# Patient Record
Sex: Male | Born: 1950
Health system: Southern US, Community
[De-identification: ages and names within clinical notes are randomized; demographics above are authoritative.]

## PROBLEM LIST (undated history)

## (undated) DIAGNOSIS — N182 Chronic kidney disease, stage 2 (mild): Secondary | ICD-10-CM

## (undated) DIAGNOSIS — Z9119 Patient's noncompliance with other medical treatment and regimen: Secondary | ICD-10-CM

## (undated) DIAGNOSIS — I10 Essential (primary) hypertension: Secondary | ICD-10-CM

## (undated) DIAGNOSIS — F1410 Cocaine abuse, uncomplicated: Secondary | ICD-10-CM

## (undated) DIAGNOSIS — K269 Duodenal ulcer, unspecified as acute or chronic, without hemorrhage or perforation: Secondary | ICD-10-CM

## (undated) DIAGNOSIS — Z720 Tobacco use: Secondary | ICD-10-CM

## (undated) DIAGNOSIS — K259 Gastric ulcer, unspecified as acute or chronic, without hemorrhage or perforation: Secondary | ICD-10-CM

## (undated) DIAGNOSIS — I480 Paroxysmal atrial fibrillation: Secondary | ICD-10-CM

## (undated) DIAGNOSIS — Z91199 Patient's noncompliance with other medical treatment and regimen due to unspecified reason: Secondary | ICD-10-CM

---

## 2016-03-12 DIAGNOSIS — Z759 Unspecified problem related to medical facilities and other health care: Secondary | ICD-10-CM | POA: Insufficient documentation

## 2017-07-25 DIAGNOSIS — K315 Obstruction of duodenum: Secondary | ICD-10-CM | POA: Insufficient documentation

## 2017-07-25 DIAGNOSIS — I10 Essential (primary) hypertension: Secondary | ICD-10-CM | POA: Diagnosis present

## 2018-05-31 DIAGNOSIS — F17200 Nicotine dependence, unspecified, uncomplicated: Secondary | ICD-10-CM | POA: Diagnosis present

## 2018-08-14 DIAGNOSIS — I4891 Unspecified atrial fibrillation: Secondary | ICD-10-CM | POA: Diagnosis present

## 2018-08-15 DIAGNOSIS — F1410 Cocaine abuse, uncomplicated: Secondary | ICD-10-CM | POA: Insufficient documentation

## 2018-08-15 DIAGNOSIS — K08109 Complete loss of teeth, unspecified cause, unspecified class: Secondary | ICD-10-CM | POA: Insufficient documentation

## 2018-08-16 DIAGNOSIS — K279 Peptic ulcer, site unspecified, unspecified as acute or chronic, without hemorrhage or perforation: Secondary | ICD-10-CM | POA: Insufficient documentation

## 2019-02-23 ENCOUNTER — Emergency Department: Payer: Medicare HMO

## 2019-02-23 ENCOUNTER — Inpatient Hospital Stay: Payer: Medicare HMO

## 2019-02-23 ENCOUNTER — Other Ambulatory Visit: Payer: Self-pay

## 2019-02-23 ENCOUNTER — Encounter: Payer: Self-pay | Admitting: Internal Medicine

## 2019-02-23 ENCOUNTER — Inpatient Hospital Stay
Admission: EM | Admit: 2019-02-23 | Discharge: 2019-03-06 | DRG: 327 | Discharge status: Against Medical Advice | Payer: Medicare HMO | Attending: Internal Medicine | Admitting: Internal Medicine

## 2019-02-23 DIAGNOSIS — Z79899 Other long term (current) drug therapy: Secondary | ICD-10-CM | POA: Diagnosis not present

## 2019-02-23 DIAGNOSIS — R1013 Epigastric pain: Secondary | ICD-10-CM | POA: Diagnosis present

## 2019-02-23 DIAGNOSIS — Z681 Body mass index (BMI) 19 or less, adult: Secondary | ICD-10-CM | POA: Diagnosis not present

## 2019-02-23 DIAGNOSIS — R109 Unspecified abdominal pain: Secondary | ICD-10-CM | POA: Diagnosis present

## 2019-02-23 DIAGNOSIS — E440 Moderate protein-calorie malnutrition: Secondary | ICD-10-CM | POA: Diagnosis present

## 2019-02-23 DIAGNOSIS — N189 Chronic kidney disease, unspecified: Secondary | ICD-10-CM | POA: Diagnosis not present

## 2019-02-23 DIAGNOSIS — N184 Chronic kidney disease, stage 4 (severe): Secondary | ICD-10-CM | POA: Diagnosis present

## 2019-02-23 DIAGNOSIS — Z841 Family history of disorders of kidney and ureter: Secondary | ICD-10-CM

## 2019-02-23 DIAGNOSIS — Z716 Tobacco abuse counseling: Secondary | ICD-10-CM

## 2019-02-23 DIAGNOSIS — E538 Deficiency of other specified B group vitamins: Secondary | ICD-10-CM | POA: Diagnosis present

## 2019-02-23 DIAGNOSIS — K311 Adult hypertrophic pyloric stenosis: Secondary | ICD-10-CM | POA: Diagnosis present

## 2019-02-23 DIAGNOSIS — F1410 Cocaine abuse, uncomplicated: Secondary | ICD-10-CM | POA: Diagnosis present

## 2019-02-23 DIAGNOSIS — E860 Dehydration: Secondary | ICD-10-CM | POA: Diagnosis present

## 2019-02-23 DIAGNOSIS — K5900 Constipation, unspecified: Secondary | ICD-10-CM | POA: Diagnosis present

## 2019-02-23 DIAGNOSIS — K269 Duodenal ulcer, unspecified as acute or chronic, without hemorrhage or perforation: Secondary | ICD-10-CM | POA: Diagnosis present

## 2019-02-23 DIAGNOSIS — K315 Obstruction of duodenum: Secondary | ICD-10-CM | POA: Diagnosis present

## 2019-02-23 DIAGNOSIS — F17200 Nicotine dependence, unspecified, uncomplicated: Secondary | ICD-10-CM | POA: Diagnosis present

## 2019-02-23 DIAGNOSIS — Z8249 Family history of ischemic heart disease and other diseases of the circulatory system: Secondary | ICD-10-CM | POA: Diagnosis not present

## 2019-02-23 DIAGNOSIS — J449 Chronic obstructive pulmonary disease, unspecified: Secondary | ICD-10-CM | POA: Diagnosis present

## 2019-02-23 DIAGNOSIS — N179 Acute kidney failure, unspecified: Secondary | ICD-10-CM | POA: Diagnosis present

## 2019-02-23 DIAGNOSIS — I129 Hypertensive chronic kidney disease with stage 1 through stage 4 chronic kidney disease, or unspecified chronic kidney disease: Secondary | ICD-10-CM | POA: Diagnosis present

## 2019-02-23 DIAGNOSIS — R778 Other specified abnormalities of plasma proteins: Secondary | ICD-10-CM | POA: Diagnosis present

## 2019-02-23 DIAGNOSIS — K2900 Acute gastritis without bleeding: Secondary | ICD-10-CM | POA: Diagnosis not present

## 2019-02-23 DIAGNOSIS — F17210 Nicotine dependence, cigarettes, uncomplicated: Secondary | ICD-10-CM | POA: Diagnosis present

## 2019-02-23 DIAGNOSIS — I480 Paroxysmal atrial fibrillation: Secondary | ICD-10-CM | POA: Diagnosis present

## 2019-02-23 DIAGNOSIS — R140 Abdominal distension (gaseous): Secondary | ICD-10-CM

## 2019-02-23 DIAGNOSIS — K2100 Gastro-esophageal reflux disease with esophagitis, without bleeding: Secondary | ICD-10-CM | POA: Diagnosis present

## 2019-02-23 DIAGNOSIS — R7989 Other specified abnormal findings of blood chemistry: Secondary | ICD-10-CM

## 2019-02-23 DIAGNOSIS — Z903 Acquired absence of stomach [part of]: Secondary | ICD-10-CM

## 2019-02-23 DIAGNOSIS — Z9119 Patient's noncompliance with other medical treatment and regimen: Secondary | ICD-10-CM | POA: Diagnosis not present

## 2019-02-23 DIAGNOSIS — I10 Essential (primary) hypertension: Secondary | ICD-10-CM | POA: Diagnosis not present

## 2019-02-23 DIAGNOSIS — Z20828 Contact with and (suspected) exposure to other viral communicable diseases: Secondary | ICD-10-CM | POA: Diagnosis present

## 2019-02-23 DIAGNOSIS — R112 Nausea with vomiting, unspecified: Secondary | ICD-10-CM | POA: Diagnosis not present

## 2019-02-23 DIAGNOSIS — E876 Hypokalemia: Secondary | ICD-10-CM

## 2019-02-23 DIAGNOSIS — R1110 Vomiting, unspecified: Secondary | ICD-10-CM

## 2019-02-23 DIAGNOSIS — R079 Chest pain, unspecified: Secondary | ICD-10-CM | POA: Diagnosis present

## 2019-02-23 DIAGNOSIS — Z8711 Personal history of peptic ulcer disease: Secondary | ICD-10-CM | POA: Diagnosis not present

## 2019-02-23 DIAGNOSIS — I4891 Unspecified atrial fibrillation: Secondary | ICD-10-CM | POA: Diagnosis present

## 2019-02-23 DIAGNOSIS — R9431 Abnormal electrocardiogram [ECG] [EKG]: Secondary | ICD-10-CM | POA: Diagnosis not present

## 2019-02-23 HISTORY — DX: Tobacco use: Z72.0

## 2019-02-23 HISTORY — DX: Gastric ulcer, unspecified as acute or chronic, without hemorrhage or perforation: K25.9

## 2019-02-23 HISTORY — DX: Patient's noncompliance with other medical treatment and regimen due to unspecified reason: Z91.199

## 2019-02-23 HISTORY — DX: Cocaine abuse, uncomplicated: F14.10

## 2019-02-23 HISTORY — DX: Chronic kidney disease, stage 2 (mild): N18.2

## 2019-02-23 HISTORY — DX: Essential (primary) hypertension: I10

## 2019-02-23 HISTORY — DX: Duodenal ulcer, unspecified as acute or chronic, without hemorrhage or perforation: K26.9

## 2019-02-23 HISTORY — DX: Paroxysmal atrial fibrillation: I48.0

## 2019-02-23 HISTORY — DX: Patient's noncompliance with other medical treatment and regimen: Z91.19

## 2019-02-23 LAB — CBC
HCT: 42.1 % (ref 39.0–52.0)
Hemoglobin: 14.7 g/dL (ref 13.0–17.0)
MCH: 28.4 pg (ref 26.0–34.0)
MCHC: 34.9 g/dL (ref 30.0–36.0)
MCV: 81.4 fL (ref 80.0–100.0)
Platelets: 334 10*3/uL (ref 150–400)
RBC: 5.17 MIL/uL (ref 4.22–5.81)
RDW: 14.5 % (ref 11.5–15.5)
WBC: 12.7 10*3/uL — ABNORMAL HIGH (ref 4.0–10.5)
nRBC: 0 % (ref 0.0–0.2)

## 2019-02-23 LAB — COMPREHENSIVE METABOLIC PANEL
ALT: 11 U/L (ref 0–44)
AST: 25 U/L (ref 15–41)
Albumin: 4.7 g/dL (ref 3.5–5.0)
Alkaline Phosphatase: 79 U/L (ref 38–126)
Anion gap: 19 — ABNORMAL HIGH (ref 5–15)
BUN: 28 mg/dL — ABNORMAL HIGH (ref 8–23)
CO2: 31 mmol/L (ref 22–32)
Calcium: 9.6 mg/dL (ref 8.9–10.3)
Chloride: 85 mmol/L — ABNORMAL LOW (ref 98–111)
Creatinine, Ser: 2.7 mg/dL — ABNORMAL HIGH (ref 0.61–1.24)
GFR calc Af Amer: 27 mL/min — ABNORMAL LOW (ref >60)
GFR calc non Af Amer: 23 mL/min — ABNORMAL LOW (ref >60)
Glucose, Bld: 111 mg/dL — ABNORMAL HIGH (ref 70–99)
Potassium: 3 mmol/L — ABNORMAL LOW (ref 3.5–5.1)
Sodium: 135 mmol/L (ref 135–145)
Total Bilirubin: 0.7 mg/dL (ref 0.3–1.2)
Total Protein: 8.7 g/dL — ABNORMAL HIGH (ref 6.5–8.1)

## 2019-02-23 LAB — LACTIC ACID, PLASMA
Lactic Acid, Venous: 1 mmol/L (ref 0.5–1.9)
Lactic Acid, Venous: 1.2 mmol/L (ref 0.5–1.9)

## 2019-02-23 LAB — T4, FREE: Free T4: 1.45 ng/dL — ABNORMAL HIGH (ref 0.61–1.12)

## 2019-02-23 LAB — LIPASE, BLOOD: Lipase: 44 U/L (ref 11–51)

## 2019-02-23 LAB — MAGNESIUM: Magnesium: 2.1 mg/dL (ref 1.7–2.4)

## 2019-02-23 LAB — TROPONIN I (HIGH SENSITIVITY)
Troponin I (High Sensitivity): 34 ng/L — ABNORMAL HIGH (ref <18)
Troponin I (High Sensitivity): 31 ng/L — ABNORMAL HIGH (ref <18)
Troponin I (High Sensitivity): 30 ng/L — ABNORMAL HIGH (ref <18)

## 2019-02-23 LAB — TYPE AND SCREEN
ABO/RH(D): B NEG
Antibody Screen: NEGATIVE

## 2019-02-23 LAB — SARS CORONAVIRUS 2 (TAT 6-24 HRS): SARS Coronavirus 2: NEGATIVE

## 2019-02-23 LAB — TSH: TSH: 4.885 u[IU]/mL — ABNORMAL HIGH (ref 0.350–4.500)

## 2019-02-23 MED ORDER — LIDOCAINE VISCOUS HCL 2 % MT SOLN
15.0000 mL | Freq: Once | OROMUCOSAL | Status: AC
  Administered 2019-02-23: 15 mL via ORAL
  Filled 2019-02-23: qty 15

## 2019-02-23 MED ORDER — LACTATED RINGERS IV SOLN
INTRAVENOUS | Status: DC
  Administered 2019-02-23 – 2019-03-02 (×11): via INTRAVENOUS

## 2019-02-23 MED ORDER — SODIUM CHLORIDE 0.9 % IV BOLUS
1000.0000 mL | Freq: Once | INTRAVENOUS | Status: AC
  Administered 2019-02-23: 1000 mL via INTRAVENOUS

## 2019-02-23 MED ORDER — SODIUM CHLORIDE 0.9 % IV SOLN
8.0000 mg/h | INTRAVENOUS | Status: DC
  Administered 2019-02-23 – 2019-02-24 (×2): 8 mg/h via INTRAVENOUS
  Filled 2019-02-23 (×2): qty 80

## 2019-02-23 MED ORDER — NICOTINE 14 MG/24HR TD PT24
14.0000 mg | MEDICATED_PATCH | Freq: Every day | TRANSDERMAL | Status: DC
  Administered 2019-02-24 – 2019-03-05 (×7): 14 mg via TRANSDERMAL
  Filled 2019-02-23 (×7): qty 1

## 2019-02-23 MED ORDER — ALBUTEROL SULFATE (2.5 MG/3ML) 0.083% IN NEBU
3.0000 mL | INHALATION_SOLUTION | Freq: Four times a day (QID) | RESPIRATORY_TRACT | Status: DC | PRN
  Administered 2019-03-02: 3 mL via RESPIRATORY_TRACT
  Filled 2019-02-23: qty 3

## 2019-02-23 MED ORDER — AMLODIPINE BESYLATE 5 MG PO TABS: 10.0000 mg | ORAL_TABLET | Freq: Every day | ORAL | Status: DC

## 2019-02-23 MED ORDER — SODIUM CHLORIDE 0.9 % IV SOLN
80.0000 mg | Freq: Once | INTRAVENOUS | Status: AC
  Administered 2019-02-23: 80 mg via INTRAVENOUS
  Filled 2019-02-23: qty 80

## 2019-02-23 MED ORDER — ALUM & MAG HYDROXIDE-SIMETH 200-200-20 MG/5ML PO SUSP
30.0000 mL | Freq: Once | ORAL | Status: AC
  Administered 2019-02-23: 30 mL via ORAL
  Filled 2019-02-23: qty 30

## 2019-02-23 NOTE — ED Provider Notes (Signed)
[facility] Emergency Department Provider Note  Time seen: 2:20 PM  I have reviewed the triage vital signs and the nursing notes.   HISTORY  Chief Complaint Chest Pain   HPI Keith [name] is a 68 y.o. male with a past medical history of hypertension, A. fib, substance abuse, chronic kidney disease, presents to the emergency department for chest pain/epigastric pain.  Patient states over the past 5 days he has been experiencing epigastric pain and lower chest pain, very frequent episodes of nausea and vomiting per patient.  Denies any significant diarrhea.  No dysuria.  Mild shortness of breath but denies any cough or fever.  Describes abdominal pain as severe and burning.  No past medical history on file.  There are no active problems to display for this patient.   Prior to Admission medications   Not on File    Not on File  No family history on file.  Social History Social History   Tobacco Use  . Smoking status: Not on file  Substance Use Topics  . Alcohol use: Not on file  . Drug use: Not on file    Review of Systems Constitutional: Negative for fever Cardiovascular: Moderate lower chest pain Respiratory: Negative for shortness of breath. Gastrointestinal: Moderate epigastric pain.  Intermittent nausea and vomiting. Musculoskeletal: Negative for musculoskeletal complaints Skin: Negative for skin complaints  Neurological: Negative for headache All other ROS negative  ____________________________________________   PHYSICAL EXAM:  VITAL SIGNS: ED Triage Vitals  Enc Vitals Group     BP 02/23/19 1350 100/76     Pulse Rate 02/23/19 1350 (!) 40     Resp 02/23/19 1350 18     Temp 02/23/19 1350 98.2 F (36.8 C)     Temp Source 02/23/19 1350 Oral     SpO2 02/23/19 1350 99 %     Weight 02/23/19 1351 120 lb (54.4 kg)     Height 02/23/19 1351 5\' 7"  (1.702 m)     Head Circumference --      Peak Flow --      Pain Score 02/23/19 1351 10      Pain Loc --      Pain Edu? --      Excl. in [place]? --     Constitutional: Alert and oriented. Well appearing and in no distress. Eyes: Normal exam ENT      Head: Normocephalic and atraumatic.      Mouth/Throat: Mucous membranes are moist. Cardiovascular: Normal rate, regular rhythm.  Respiratory: Normal respiratory effort without tachypnea nor retractions. Breath sounds are clear Gastrointestinal: Moderate epigastric tenderness palpation without rebound guarding or distention. Musculoskeletal: Nontender with normal range of motion in all extremities.  No edema. Neurologic:  Normal speech and language. No gross focal neurologic deficits Skin: Very dry appearing skin. Psychiatric: Mood and affect are normal.   ____________________________________________    EKG  EKG viewed and interpreted by myself shows a normal sinus rhythm 85 bpm with a narrow QRS, normal axis, largely normal intervals.  Patient does have inferolateral T wave inversions.  No old EKG for comparison.  ____________________________________________    RADIOLOGY  Chest x-ray shows COPD but no acute abnormality. Abdominal x-ray shows nonobstructive gas pattern  ____________________________________________   INITIAL IMPRESSION / ASSESSMENT AND PLAN / ED COURSE  Pertinent labs & imaging results that were available during my care of the patient were reviewed by me and considered in my medical decision making (see chart for details).   Patient presents  to the emergency department for chest pain/epigastric pain ongoing for the past 4 or 5 days per patient.  Differential would include ACS, gastritis, pancreatitis, biliary disease, gastroenteritis.  Patient states a dull epigastric/lower chest pain that is somewhat relieved when he eats, however states when he drinks he will often times belch up liquid.  States a mild soreness in his chest due to the vomiting/belching.  States slight shortness of breath.  States of  symptoms have been ongoing for 5 days.  We will check labs including cardiac enzymes.  Patient does have inferolateral T wave inversions with no old EKG for comparison.  Patient's labs have resulted showing slight renal insufficiency with a creatinine of 2.7 and an anion gap of 19.  We will IV hydrate the patient.  I reviewed the patient's care everywhere notes his last creatinine documented was 2.5 in August 2019, however prior to that it appears that his renal function was considerably better and that admission was due to nausea and vomiting as well.  Patient also has an elevated troponin of 34 however when I reviewed his labs in care everywhere he has a history of mildly elevated troponins.  Last ER note from [facility] also notes T wave inversions although does not specifically state which lead.  Notes also suggest the patient has gastric outlet obstruction diagnosed in 2019 and possibly again earlier this year due to gastric ulcers and gastritis, in reviewing the patient's notes it appears that he did not follow-up with GI.  Per [facility] patient also had polysubstance abuse with opiates marijuana and cocaine on UDS.  We will repeat a UDS in the emergency department.  Given the patient's significant dehydration elevated anion gap and continued nausea we will place patient on Protonix infusion and admit to the hospital service for further treatment for presumed gastritis/gastric ulcers.  Keith [name] was evaluated in Emergency Department on 02/23/2019 for the symptoms described in the history of present illness. He was evaluated in the context of the global COVID-19 pandemic, which necessitated consideration that the patient might be at risk for infection with the SARS-CoV-2 virus that causes COVID-19. Institutional protocols and algorithms that pertain to the evaluation of patients at risk for COVID-19 are in a state of rapid change based on information released by regulatory bodies including the CDC and federal  and state organizations. These policies and algorithms were followed during the patient's care in the ED.  ____________________________________________   FINAL CLINICAL IMPRESSION(S) / ED DIAGNOSES  Chest pain Epigastric pain Gastritis   Harvest Dark, MD 02/23/19 1531

## 2019-02-23 NOTE — H&P (Signed)
History and Physical    Keith [name] [id number] DOB: [birth year] DOA: 02/23/2019  PCP: Patient, No Pcp Per (Confirm with patient/family/NH records and if not entered, this has to be entered at [facility] point of entry) Patient coming from: Home  I have personally briefly reviewed patient's old medical records in [place]  Chief Complaint: Abdominal pain/ Chest pain.   HPI: Keith [name] is a 68 y.o. male with medical history significant of PUD . Htn. A.fib not on anticoagulation secondary to his gastric and duodenal ulcers, presents with Chest pain and epigastric pain. Pt reports that he has had two episode of similar episode in past. He has tried to make appt with [facility] and cannot get thru and he has also tried to call [facility] for primary care and cant get thru. Pt is noncompliant and has not taken his bp meds.  Patient also reports that he does not use drugs in fact he went to a gathering few days ago" drug party" where he did not use any substances.  He is cutting down his smoking and 1 pack some 3 days.  Per patient he has not had outpatient follow-up as he could not connect with the specialists at [facility]. Chart review shows patient underwent EGD on 08/15/2018 which showed LA grade D esophagitis, non-bleeding gastric ulcer, congested friable mucosa in the pylorus s/p epinephrine and large duodenal ulcer with duodenal stenosis.plan was for pt ot have repeat egd and he did not follow up. Patient has a past medical history of A. fib, substance abuse, CKD.  ED Course:  Patient in the emergency room is alert awake oriented initial vitals shows blood pressure 100/76, heart rate of 40, respirations of 18, temperature of 98.2.  Chest x-ray shows COPD, abdominal x-ray shows nonobstructive gas pattern.  Covid testing is pending.   Review of Systems: As per HPI otherwise 10 point review of systems negative.   Past Medical History:  Diagnosis Date  . Hypertension   . Paroxysmal atrial fibrillation  (HCC)   . Tobacco abuse     History reviewed. No pertinent surgical history.   reports that he has been smoking cigarettes. He does not have any smokeless tobacco history on file. He reports previous alcohol use. No history on file for drug.  No Known Allergies  History reviewed. No pertinent family history.   Prior to Admission medications   Not on File    Physical Exam: Vitals:   02/23/19 1350 02/23/19 1351 02/23/19 1500 02/23/19 1814  BP: 100/76  110/88 110/80  Pulse: (!) 40  85 68  Resp: 18   15  Temp: 98.2 F (36.8 C)     TempSrc: Oral     SpO2: 99%  94% 98%  Weight:  54.4 kg    Height:  5\' 7"  (1.702 m)      Constitutional: NAD, calm, comfortable Vitals:   02/23/19 1350 02/23/19 1351 02/23/19 1500 02/23/19 1814  BP: 100/76  110/88 110/80  Pulse: (!) 40  85 68  Resp: 18   15  Temp: 98.2 F (36.8 C)     TempSrc: Oral     SpO2: 99%  94% 98%  Weight:  54.4 kg    Height:  5\' 7"  (1.702 m)     Eyes: PERRL, lids and conjunctivae normal ENMT: Mucous membranes are moist. Posterior pharynx clear of any exudate or lesions.Normal dentition.  Neck: normal, supple, no masses, no thyromegaly Respiratory: clear to auscultation bilaterally, no wheezing, no crackles. Normal  respiratory effort. No accessory muscle use.  Cardiovascular: Regular rate and rhythm, no murmurs / rubs / gallops. No extremity edema. 2+ pedal pulses. No carotid bruits.  Abdomen: no tenderness, no masses palpated. No hepatosplenomegaly. Bowel sounds positive.  Musculoskeletal: no clubbing / cyanosis. No joint deformity upper and lower extremities. Good ROM, no contractures. Normal muscle tone.  Skin: no rashes, lesions, ulcers. No induration Neurologic: CN 2-12 grossly intact. Sensation intact, DTR normal. Strength 5/5 in all 4.  Psychiatric: Normal judgment and insight. Alert and oriented x 3. Normal mood.   (Anything < 9 systems with 2 bullets each down codes to level 1) (If patient refuses exam  can't bill higher level) (Make sure to document decubitus ulcers present on admission -- if possible -- and whether patient has chronic indwelling catheter at time of admission)  Labs on Admission: I have personally reviewed following labs and imaging studies  CBC: Recent Labs  Lab 02/23/19 1414  WBC 12.7*  HGB 14.7  HCT 42.1  MCV 81.4  PLT [id number]   Basic Metabolic Panel: Recent Labs  Lab 02/23/19 1414  NA 135  K 3.0*  CL 85*  CO2 31  GLUCOSE 111*  BUN 28*  CREATININE 2.70*  CALCIUM 9.6   GFR: Estimated Creatinine Clearance: 20.1 mL/min (A) (by C-G formula based on SCr of 2.7 mg/dL (H)). Liver Function Tests: Recent Labs  Lab 02/23/19 1414  AST 25  ALT 11  ALKPHOS 79  BILITOT 0.7  PROT 8.7*  ALBUMIN 4.7   Recent Labs  Lab 02/23/19 1414  LIPASE 44   No results for input(s): AMMONIA in the last 168 hours. Coagulation Profile: No results for input(s): INR, PROTIME in the last 168 hours. Cardiac Enzymes: No results for input(s): CKTOTAL, CKMB, CKMBINDEX, TROPONINI in the last 168 hours. BNP (last 3 results) No results for input(s): PROBNP in the last 8760 hours. HbA1C: No results for input(s): HGBA1C in the last 72 hours. CBG: No results for input(s): GLUCAP in the last 168 hours. Lipid Profile: No results for input(s): CHOL, HDL, LDLCALC, TRIG, CHOLHDL, LDLDIRECT in the last 72 hours. Thyroid Function Tests: No results for input(s): TSH, T4TOTAL, FREET4, T3FREE, THYROIDAB in the last 72 hours. Anemia Panel: No results for input(s): VITAMINB12, FOLATE, FERRITIN, TIBC, IRON, RETICCTPCT in the last 72 hours. Urine analysis: No results found for: COLORURINE, APPEARANCEUR, LABSPEC, [place], GLUCOSEU, HGBUR, BILIRUBINUR, KETONESUR, PROTEINUR, UROBILINOGEN, NITRITE, LEUKOCYTESUR  Radiological Exams on Admission: Dg Chest Portable 1 View  Result Date: 02/23/2019 CLINICAL DATA:  Chest pain and near syncopal episode following a bowel movement. Nausea and vomiting  for the past 2 days. Smoker. EXAM: PORTABLE CHEST 1 VIEW COMPARISON:  None. FINDINGS: Normal sized heart. Tortuous aorta. Moderate central peribronchial thickening. Hyperexpanded, clear lungs. Unremarkable bones. IMPRESSION: Moderate changes of COPD and chronic bronchitis. No acute abnormality. Electronically Signed   By: Claudie Revering M.D.   On: 02/23/2019 14:43   Dg Abd Portable 2 Views  Result Date: 02/23/2019 CLINICAL DATA:  Abdomen pain, chest pain nausea and vomiting EXAM: PORTABLE ABDOMEN - 2 VIEW COMPARISON:  None. FINDINGS: Visualized lung bases are clear. Questionable lucency at the epigastric region. Nonobstructed bowel-gas pattern. High-riding colon at the right upper quadrant. IMPRESSION: 1. Nonobstructed gas pattern. 2. Questionable lucency at the epigastric region, consider decubitus view or CT to exclude intraperitoneal air. Electronically Signed   By: Donavan Foil M.D.   On: 02/23/2019 15:20    EKG: Independently reviewed.  Normal sinus rhythm 85 LVH.  Assessment/Plan Principal Problem:   Left-sided chest pain Active Problems:   Atrial fibrillation (HCC)   Abdominal pain   Essential hypertension   Hypokalemia   Troponin level elevated   Tobacco use disorder   Moderate protein-energy malnutrition (HCC)   Acute kidney injury superimposed on CKD (HCC)  Left-sided chest wall pain: Suspect this to be noncardiac chest pain radiating to his left chest from his abdomen related to his peptic ulcer disease. We will however follow cardiac enzymes, mid patient to cardiac telemetry, monitor electrolytes, consider CT chest .  Obtain 2D echocardiogram evaluation wall motion abnormality ejection fraction, pericardial effusion.  Abdominal pain: Attribute to PUD, we will check lactic and guaiac , GI Consult for UGI eval and if negative we will consider vascular consult for ischemic eval. USG of abdomen as well.  PAF : Anticoagulation held due to PUD. Rate control and home meds  restarted. thyroid studies.   Essential hypertension: Patient has not been on his blood pressure regimen for nearly 2 weeks per history. Patient's amlodipine follow closely blood pressure and monitor for hypotension.  Hypokalemia: We will replete patient's potassium with 20 mEq of potassium.  Abnormal troponin levels: Attributed to patient's acute kidney injury. We will perform a 2D echocardiogram.  Tobacco abuse disorder discussed with patient about tobacco cessation briefly: We will start patient on nicotine patch.   Moderate protein energy malnutrition:  Patient's last weight is 54.4 kg.  I suspect patient's weight loss is secondary to decreased p.o. intake Secondary to stomach issues.  Consider evaluation of patient for mesenteric angina.  AKI on chronic kidney disease: Although we do not have a clear cut baseline for this patient's acute kidney injury. Goal will be to continue cautious hydration and monitoring daily, We will avoid nephrotoxic agents and medication and contrast and renally dose , meds needed.    Basically noncompliant 68 year old African-American male with past medical history of paroxysmal A. fib not on anticoagulation secondary to to peptic ulcers, substance abuse, pancreatitis seen in the emergency room today with complaints of abdominal pain and chest pain, patient does report some belching along with the chest discomfort that he has been getting.  Lab results as we discussed show mildly worsening creatinine with his baseline of 2.5 in August no other records in chart for him.  Patient does have an elevated troponin which we suspect is noncardiac related was likely related to his chronic kidney disease.  Labs also show an elevated anion gap which I suspect is because of his acute kidney injury on chronic kidney disease stage IV.   DVT prophylaxis: SCDs Code Status: Full code  Family Communication: None at bedside  Disposition Plan: Discharge in 3 to 4 days  consider assisted living facility  Consults called: GI  Admission status: Inaptient  Para Skeans MD [facility]  If 7PM-7AM, please contact night-coverage [email or web address] Password [facility]  02/23/2019, 6:17 PM

## 2019-02-23 NOTE — ED Triage Notes (Signed)
Pt arrived via EMS from a friends house d/t CP and near syncope after a BM. Pt reports having sternal CP since Monday and being unable to keep any food down since yesterday. Pt has had n/v x 2 days, Pt has hx of A. Fib and MI.

## 2019-02-24 ENCOUNTER — Inpatient Hospital Stay
Admit: 2019-02-24 | Discharge: 2019-02-24 | Disposition: A | Discharge status: Home / Self Care | Payer: Medicare HMO | Attending: Internal Medicine | Admitting: Internal Medicine

## 2019-02-24 ENCOUNTER — Other Ambulatory Visit: Payer: Self-pay

## 2019-02-24 ENCOUNTER — Encounter: Payer: Self-pay | Admitting: Physician Assistant

## 2019-02-24 ENCOUNTER — Inpatient Hospital Stay: Payer: Medicare HMO

## 2019-02-24 DIAGNOSIS — E876 Hypokalemia: Secondary | ICD-10-CM

## 2019-02-24 DIAGNOSIS — Z8711 Personal history of peptic ulcer disease: Secondary | ICD-10-CM

## 2019-02-24 DIAGNOSIS — I480 Paroxysmal atrial fibrillation: Secondary | ICD-10-CM

## 2019-02-24 DIAGNOSIS — I10 Essential (primary) hypertension: Secondary | ICD-10-CM

## 2019-02-24 DIAGNOSIS — F1410 Cocaine abuse, uncomplicated: Secondary | ICD-10-CM

## 2019-02-24 DIAGNOSIS — R9431 Abnormal electrocardiogram [ECG] [EKG]: Secondary | ICD-10-CM

## 2019-02-24 DIAGNOSIS — F17200 Nicotine dependence, unspecified, uncomplicated: Secondary | ICD-10-CM

## 2019-02-24 DIAGNOSIS — E440 Moderate protein-calorie malnutrition: Secondary | ICD-10-CM

## 2019-02-24 DIAGNOSIS — N179 Acute kidney failure, unspecified: Secondary | ICD-10-CM

## 2019-02-24 DIAGNOSIS — K2900 Acute gastritis without bleeding: Secondary | ICD-10-CM

## 2019-02-24 DIAGNOSIS — E860 Dehydration: Secondary | ICD-10-CM

## 2019-02-24 DIAGNOSIS — R079 Chest pain, unspecified: Secondary | ICD-10-CM

## 2019-02-24 DIAGNOSIS — R1013 Epigastric pain: Secondary | ICD-10-CM

## 2019-02-24 DIAGNOSIS — N189 Chronic kidney disease, unspecified: Secondary | ICD-10-CM

## 2019-02-24 LAB — URINE DRUG SCREEN, QUALITATIVE (ARMC ONLY)
Amphetamines, Ur Screen: NOT DETECTED
Barbiturates, Ur Screen: NOT DETECTED
Benzodiazepine, Ur Scrn: NOT DETECTED
Cannabinoid 50 Ng, Ur ~~LOC~~: NOT DETECTED
Cocaine Metabolite,Ur ~~LOC~~: POSITIVE — AB
MDMA (Ecstasy)Ur Screen: NOT DETECTED
Methadone Scn, Ur: NOT DETECTED
Opiate, Ur Screen: NOT DETECTED
Phencyclidine (PCP) Ur S: NOT DETECTED
Tricyclic, Ur Screen: NOT DETECTED

## 2019-02-24 LAB — HIV ANTIBODY (ROUTINE TESTING W REFLEX): HIV Screen 4th Generation wRfx: NONREACTIVE

## 2019-02-24 LAB — COMPREHENSIVE METABOLIC PANEL
ALT: 8 U/L (ref 0–44)
AST: 16 U/L (ref 15–41)
Albumin: 3.2 g/dL — ABNORMAL LOW (ref 3.5–5.0)
Alkaline Phosphatase: 54 U/L (ref 38–126)
Anion gap: 8 (ref 5–15)
BUN: 26 mg/dL — ABNORMAL HIGH (ref 8–23)
CO2: 25 mmol/L (ref 22–32)
Calcium: 7.6 mg/dL — ABNORMAL LOW (ref 8.9–10.3)
Chloride: 99 mmol/L (ref 98–111)
Creatinine, Ser: 1.6 mg/dL — ABNORMAL HIGH (ref 0.61–1.24)
GFR calc Af Amer: 51 mL/min — ABNORMAL LOW (ref >60)
GFR calc non Af Amer: 44 mL/min — ABNORMAL LOW (ref >60)
Glucose, Bld: 89 mg/dL (ref 70–99)
Potassium: 2.6 mmol/L — CL (ref 3.5–5.1)
Sodium: 132 mmol/L — ABNORMAL LOW (ref 135–145)
Total Bilirubin: 0.8 mg/dL (ref 0.3–1.2)
Total Protein: 6.2 g/dL — ABNORMAL LOW (ref 6.5–8.1)

## 2019-02-24 LAB — CBC WITH DIFFERENTIAL/PLATELET
Abs Immature Granulocytes: 0.05 10*3/uL (ref 0.00–0.07)
Basophils Absolute: 0 10*3/uL (ref 0.0–0.1)
Basophils Relative: 1 %
Eosinophils Absolute: 0.1 10*3/uL (ref 0.0–0.5)
Eosinophils Relative: 2 %
HCT: 34.5 % — ABNORMAL LOW (ref 39.0–52.0)
Hemoglobin: 11.8 g/dL — ABNORMAL LOW (ref 13.0–17.0)
Immature Granulocytes: 1 %
Lymphocytes Relative: 34 %
Lymphs Abs: 2.8 10*3/uL (ref 0.7–4.0)
MCH: 28.9 pg (ref 26.0–34.0)
MCHC: 34.2 g/dL (ref 30.0–36.0)
MCV: 84.4 fL (ref 80.0–100.0)
Monocytes Absolute: 0.8 10*3/uL (ref 0.1–1.0)
Monocytes Relative: 9 %
Neutro Abs: 4.5 10*3/uL (ref 1.7–7.7)
Neutrophils Relative %: 53 %
Platelets: 250 10*3/uL (ref 150–400)
RBC: 4.09 MIL/uL — ABNORMAL LOW (ref 4.22–5.81)
RDW: 14.5 % (ref 11.5–15.5)
WBC: 8.3 10*3/uL (ref 4.0–10.5)
nRBC: 0 % (ref 0.0–0.2)

## 2019-02-24 LAB — URINALYSIS, COMPLETE (UACMP) WITH MICROSCOPIC
Bacteria, UA: NONE SEEN
Bilirubin Urine: NEGATIVE
Glucose, UA: NEGATIVE mg/dL
Hgb urine dipstick: NEGATIVE
Ketones, ur: NEGATIVE mg/dL
Leukocytes,Ua: NEGATIVE
Nitrite: NEGATIVE
Protein, ur: NEGATIVE mg/dL
Specific Gravity, Urine: 1.008 (ref 1.005–1.030)
WBC, UA: NONE SEEN WBC/hpf (ref 0–5)
pH: 6 (ref 5.0–8.0)

## 2019-02-24 LAB — CREATININE, URINE, RANDOM: Creatinine, Urine: 90 mg/dL

## 2019-02-24 LAB — SODIUM, URINE, RANDOM: Sodium, Ur: 21 mmol/L

## 2019-02-24 LAB — ECHOCARDIOGRAM LIMITED
Height: 67 in
Weight: 2072 oz

## 2019-02-24 LAB — CK: Total CK: 138 U/L (ref 49–397)

## 2019-02-24 LAB — LIPASE, BLOOD: Lipase: 46 U/L (ref 11–51)

## 2019-02-24 MED ORDER — HYDROCERIN EX CREA
TOPICAL_CREAM | Freq: Two times a day (BID) | CUTANEOUS | Status: DC
  Administered 2019-02-24 – 2019-03-06 (×11): via TOPICAL
  Filled 2019-02-24: qty 113

## 2019-02-24 MED ORDER — ADULT MULTIVITAMIN W/MINERALS CH
1.0000 | ORAL_TABLET | Freq: Every day | ORAL | Status: DC
  Administered 2019-02-25 – 2019-02-26 (×2): 1 via ORAL
  Filled 2019-02-24 (×2): qty 1

## 2019-02-24 MED ORDER — IOHEXOL 9 MG/ML PO SOLN
500.0000 mL | ORAL | Status: AC
  Administered 2019-02-24 (×2): 500 mL via ORAL

## 2019-02-24 MED ORDER — PANTOPRAZOLE SODIUM 40 MG IV SOLR
40.0000 mg | Freq: Two times a day (BID) | INTRAVENOUS | Status: DC
  Administered 2019-02-24 – 2019-03-06 (×20): 40 mg via INTRAVENOUS
  Filled 2019-02-24 (×20): qty 40

## 2019-02-24 MED ORDER — POTASSIUM CHLORIDE CRYS ER 20 MEQ PO TBCR
40.0000 meq | EXTENDED_RELEASE_TABLET | ORAL | Status: AC
  Administered 2019-02-24 (×3): 40 meq via ORAL
  Filled 2019-02-24 (×3): qty 2

## 2019-02-24 MED ORDER — BOOST / RESOURCE BREEZE PO LIQD CUSTOM
1.0000 | Freq: Three times a day (TID) | ORAL | Status: DC
  Administered 2019-02-24 – 2019-02-26 (×5): 1 via ORAL

## 2019-02-24 MED ORDER — SODIUM CHLORIDE 0.9 % IV SOLN: INTRAVENOUS | Status: DC

## 2019-02-24 NOTE — Progress Notes (Addendum)
Initial Nutrition Assessment  DOCUMENTATION CODES:   Non-severe (moderate) malnutrition in context of social or environmental circumstances  INTERVENTION:  Provide Boost Breeze po TID, each supplement provides 250 kcal and 9 grams of protein.  Once diet advances past clear liquids provide Ensure Enlive po TID, each supplement provides 350 kcal and 20 grams of protein.  Provide daily MVI.  NUTRITION DIAGNOSIS:   Moderate Malnutrition related to social / environmental circumstances(polysubstance abuse) as evidenced by moderate fat depletion, moderate muscle depletion, severe muscle depletion.  GOAL:   Patient will meet greater than or equal to 90% of their needs  MONITOR:   PO intake, Supplement acceptance, Diet advancement, Labs, Weight trends, I & O's  REASON FOR ASSESSMENT:   Malnutrition Screening Tool, Consult Assessment of nutrition requirement/status  ASSESSMENT:   68 year old male with PMHx of paroxysmal A-fib, HTN, polysubstance abuse (cocaine positive), hx of gastric and duodenal ulcers admitted with epigastric pain likely from underlying ulcers.   Met with patient at bedside. He reports he has had a decreased appetite and intake for a while now. He has been eating less often and smaller amounts at meals. Patient is unable to provide any details on intake PTA. He reports that right before coming in he was having trouble tolerating liquids but was still able to tolerate solids. He is now on CLD and is tolerating well. He had almost all of his liquid breakfast tray this morning. He is amenable to drinking oral nutrition supplements to help meet calorie/protein needs.  No weight history to trend in chart. Patient reports his UBW was 150 lbs but he is unsure the last time he weighed this or how quickly the weight was lost.  Medications reviewed and include: nicotine patch, potassium chloride 40 mEq Q4hrs PO x 3 today, LR @ 100 mL/hr, pantoprazole.  Labs reviewed: Sodium  132, Potassium 2.6, BUN 26, Creatinine 1.6.  NUTRITION - FOCUSED PHYSICAL EXAM:    Most Recent Value  Orbital Region  Moderate depletion  Upper Arm Region  Severe depletion  Thoracic and Lumbar Region  Moderate depletion  Buccal Region  Moderate depletion  Temple Region  Severe depletion  Clavicle Bone Region  Severe depletion  Clavicle and Acromion Bone Region  Severe depletion  Scapular Bone Region  Moderate depletion  Dorsal Hand  Moderate depletion  Patellar Region  Moderate depletion  Anterior Thigh Region  Moderate depletion  Posterior Calf Region  Severe depletion  Edema (RD Assessment)  None  Hair  Reviewed  Eyes  Reviewed  Mouth  Reviewed  Skin  Reviewed [dry and flaky]  Nails  Reviewed     Diet Order:   Diet Order            Diet clear liquid Room service appropriate? Yes; Fluid consistency: Thin  Diet effective now             EDUCATION NEEDS:   No education needs have been identified at this time  Skin:  Skin Assessment: Reviewed RN Assessment  Last BM:  02/16/2019 per chart  Height:   Ht Readings from Last 1 Encounters:  02/23/19 '5\' 7"'$  (1.702 m)   Weight:   Wt Readings from Last 1 Encounters:  02/24/19 58.7 kg   Ideal Body Weight:  67.3 kg  BMI:  Body mass index is 20.28 kg/m.  Estimated Nutritional Needs:   Kcal:  1600-1800  Protein:  80-90 grams  Fluid:  1.6-1.8 L/day  Jacklynn Barnacle, MS, RD, LDN Office: [phone] Pager:  [phone] After Hours/Weekend Pager: [phone]

## 2019-02-24 NOTE — Progress Notes (Signed)
*  PRELIMINARY RESULTS* Echocardiogram 2D Echocardiogram has been performed. A Complete Echo was performed, this was ordered incorrectly.  Lavell Luster Corrigan Kretschmer 02/24/2019, 12:04 PM

## 2019-02-24 NOTE — Consult Note (Addendum)
Cardiology Consultation:   Patient ID: Keith [name]; [id number]; 1950-06-28   Admit date: 02/23/2019 Date of Consult: 02/24/2019  Primary Care Provider: Patient, No Pcp Per Primary Cardiologist: new to [facility] - consult by [place]   Patient Profile:   Keith [name] is a 68 y.o. male with a hx of PAF not on anticoagulation secondary to gastric outlet obstruction secondary to deep ulcerations, CKD stage II, polysubstance abuse with cocaine use, COPD with ongoing tobacco abuse, syncope felt to be vasovagal versus orthostatic, HTN, and noncompliance who is being seen today for the evaluation of preprocedure cardiac evaluation at the request of Dr. Grandville Silos.  History of Present Illness:   Keith [name] was previously admitted in 07/2017 at [facility] with gastric outlet obstruction secondary to multiple gastric ulcers including pyloric channel ulcer with associated stenosis.  More recently, he was admitted to [facility] in 08/2018 with nausea and vomiting with the inability to tolerate p.o. intake as well as syncope.  His syncope was felt to be vasovagal versus orthostatic in etiology in the setting of dehydration from days of vomiting.  He was noted to have positive orthostatic vital signs.  Labs also showed acute on chronic kidney disease with a serum creatinine of 2.34 at admission with a baseline of 1.2-1.3.  He was also noted to be in A. fib on EKG and telemetry with recommendation from GI to avoid oral anticoagulation with ongoing gastric and duodenal ulcers.  Troponin was minimally elevated at 0.233 with new T wave inversions on EKG with findings felt to most likely represent demand ischemia in the setting of volume depletion, vomiting, and cocaine use.  He underwent EGD as outlined in the discharge summary from [facility].  Repeat EGD was recommended though patient was lost to follow-up.  He presented to [facility] on 02/23/2019 with worsening of ongoing epigastric pain for 1 to 2 months.  Over the past 7 days he has  not been able to keep any solids or liquids down noting multiple daily episodes of emesis. In this setting, he started having worsening epigastric pain. No chest pain, SOB, palpitations, presyncope or syncope. No lower extremity swelling, abdominal distension, orthopnea, or PND. He indicates he had try to get follow-up appointments with [facility] though states he could not get through.  Upon the patient's arrival to [facility] they were found to have stable vital signs.  EKG showed sinus rhythm with frequent PACs, LVH with early repolarization abnormality, nonspecific inferolateral T wave inversion CXR showed chronic bronchitis with no acute abnormality. Labs showed urine drug screen positive for cocaine, high-sensitivity troponin of 34 with a delta of 31, COVID-19 negative, potassium 3.0 trending to 2.6, serum creatinine 2.7 trending to 1.6 with a baseline of 1.2-1.3, TSH 4.885, free T4 1.45, magnesium 2.1.  GI has consulted and are planning for EGD with timing of procedure to be determined on cardiac/anesthesia evaluation.  Currently, laying in bed comfortably without chest pain or SOB.   Past Medical History:  Diagnosis Date  . CKD (chronic kidney disease), stage II   . Cocaine abuse ([place])   . Duodenal ulcer   . Gastric ulcer   . Hypertension   . Noncompliance   . Paroxysmal atrial fibrillation (HCC)   . Tobacco abuse     History reviewed. No pertinent surgical history.   Home Meds: Prior to Admission medications   Medication Sig Start Date End Date Taking? Authorizing Provider  albuterol (VENTOLIN HFA) 108 (90 Base) MCG/ACT inhaler Inhale 2 puffs into  the lungs every 6 (six) hours as needed. 08/16/18 08/16/19 Yes [provider]  amLODipine (NORVASC) 10 MG tablet Take 10 mg by mouth daily. 08/16/18 08/16/19 Yes [provider]    Inpatient Medications: Scheduled Meds: . feeding supplement  1 Container Oral TID BM  . [START ON 02/25/2019] multivitamin with minerals  1 tablet Oral Daily   . nicotine  14 mg Transdermal Daily  . potassium chloride  40 mEq Oral Q4H   Continuous Infusions: . lactated ringers 100 mL/hr at 02/24/19 0634  . pantoprozole (PROTONIX) infusion 8 mg/hr (02/24/19 0311)   PRN Meds: albuterol  Allergies:  No Known Allergies  Social History:   Social History   Socioeconomic History  . Marital status: Single    Spouse name: Not on file  . Number of children: Not on file  . Years of education: Not on file  . Highest education level: Not on file  Occupational History  . Not on file  Social Needs  . Financial resource strain: Not on file  . Food insecurity    Worry: Not on file    Inability: Not on file  . Transportation needs    Medical: Not on file    Non-medical: Not on file  Tobacco Use  . Smoking status: Current Every Day Smoker    Types: Cigarettes  Substance and Sexual Activity  . Alcohol use: Not Currently  . Drug use: Not on file  . Sexual activity: Not on file  Lifestyle  . Physical activity    Days per week: Not on file    Minutes per session: Not on file  . Stress: Not on file  Relationships  . Social Herbalist on phone: Not on file    Gets together: Not on file    Attends religious service: Not on file    Active member of club or organization: Not on file    Attends meetings of clubs or organizations: Not on file    Relationship status: Not on file  . Intimate partner violence    Fear of current or ex partner: Not on file    Emotionally abused: Not on file    Physically abused: Not on file    Forced sexual activity: Not on file  Other Topics Concern  . Not on file  Social History Narrative  . Not on file     Family History:   Family History  Problem Relation Age of Onset  . Diabetes Mother   . Hypertension Mother   . CAD Father   . Diabetes Sister   . Hypertension Sister   . Kidney failure Sister     ROS:  Review of Systems  Constitutional: Positive for malaise/fatigue. Negative for  chills, diaphoresis, fever and weight loss.  HENT: Negative for congestion.   Eyes: Negative for discharge and redness.  Respiratory: Negative for cough, hemoptysis, sputum production, shortness of breath and wheezing.   Cardiovascular: Negative for chest pain, palpitations, orthopnea, claudication, leg swelling and PND.  Gastrointestinal: Positive for abdominal pain, heartburn, nausea and vomiting. Negative for blood in stool and melena.  Genitourinary: Negative for hematuria.  Musculoskeletal: Negative for falls and myalgias.  Skin: Negative for rash.  Neurological: Positive for weakness. Negative for dizziness, tingling, tremors, sensory change, speech change, focal weakness and loss of consciousness.  Endo/Heme/Allergies: Does not bruise/bleed easily.  Psychiatric/Behavioral: Positive for substance abuse. The patient is not nervous/anxious.   All other systems reviewed and are negative.  Physical Exam/Data:   Vitals:   02/23/19 1953 02/23/19 2345 02/24/19 0513 02/24/19 0833  BP: (!) 153/91 (!) 148/96 (!) 132/91 (!) 137/93  Pulse: (!) 58 (!) 57 (!) 55 (!) 53  Resp: 14 20 19    Temp: 98 F (36.7 C) 97.7 F (36.5 C) 97.7 F (36.5 C) (!) 97.4 F (36.3 C)  TempSrc: Oral  Oral Oral  SpO2: 99% 100% 98% 100%  Weight:  58.5 kg 58.7 kg   Height:  5\' 7"  (1.702 m)      Intake/Output Summary (Last 24 hours) at 02/24/2019 1341 Last data filed at 02/24/2019 1018 Gross per 24 hour  Intake 1692.07 ml  Output 900 ml  Net 792.07 ml   Filed Weights   02/23/19 1351 02/23/19 2345 02/24/19 0513  Weight: 54.4 kg 58.5 kg 58.7 kg   Body mass index is 20.28 kg/m.   Physical Exam: General: Frail appearing, missing multiple teeth, frequently brushing his nose with his hand, in no acute distress. Head: Normocephalic, atraumatic, sclera non-icteric, no xanthomas, nares without discharge.  Neck: Negative for carotid bruits. JVD not elevated. Lungs: Clear bilaterally to auscultation without  wheezes, rales, or rhonchi. Breathing is unlabored. Heart: Irregular with S1 S2. No murmurs, rubs, or gallops appreciated. Abdomen: Soft, non-tender, non-distended with normoactive bowel sounds. No hepatomegaly. No rebound/guarding. No obvious abdominal masses. Msk:  Strength and tone appear normal for age. Extremities: No clubbing or cyanosis. No edema. Distal pedal pulses are 2+ and equal bilaterally. Neuro: Alert and oriented X 3. No facial asymmetry. No focal deficit. Moves all extremities spontaneously. Psych:  Responds to questions appropriately with a normal affect.   EKG:  The EKG was personally reviewed and demonstrates: NSR, 85 bpm, frequent PACs, LVH with early repolarization abnormality, nonspecific inferolateral T wave inversion Telemetry:  Telemetry was personally reviewed and demonstrates: sinus bradycardia, 40s to 50s bpm, frequent PACs  Weights: Filed Weights   02/23/19 1351 02/23/19 2345 02/24/19 0513  Weight: 54.4 kg 58.5 kg 58.7 kg    Relevant CV Studies: Nuclear stress test 2007: Impression:  - Normal study. No evidence of significant ischemia or scar on imaging study.  - P~ost stress: Overall systolic function is normal. EF = 59% post-stress. At rest, the ejection fraction was 62%.  Laboratory Data:  Chemistry Recent Labs  Lab 02/23/19 1414 02/24/19 0611  NA 135 132*  K 3.0* 2.6*  CL 85* 99  CO2 31 25  GLUCOSE 111* 89  BUN 28* 26*  CREATININE 2.70* 1.60*  CALCIUM 9.6 7.6*  GFRNONAA 23* 44*  GFRAA 27* 51*  ANIONGAP 19* 8    Recent Labs  Lab 02/23/19 1414 02/24/19 0611  PROT 8.7* 6.2*  ALBUMIN 4.7 3.2*  AST 25 16  ALT 11 8  ALKPHOS 79 54  BILITOT 0.7 0.8   Hematology Recent Labs  Lab 02/23/19 1414 02/24/19 0834  WBC 12.7* 8.3  RBC 5.17 4.09*  HGB 14.7 11.8*  HCT 42.1 34.5*  MCV 81.4 84.4  MCH 28.4 28.9  MCHC 34.9 34.2  RDW 14.5 14.5  PLT 334 250   Cardiac EnzymesNo results for input(s): TROPONINI in the last 168 hours. No  results for input(s): TROPIPOC in the last 168 hours.  BNPNo results for input(s): BNP, PROBNP in the last 168 hours.  DDimer No results for input(s): DDIMER in the last 168 hours.  Radiology/Studies:  US Abdomen Complete  Result Date: 02/23/2019 IMPRESSION: No acute findings. 1.6 cm echogenic area within the right hepatic lobe most compatible  with hemangioma. Electronically Signed   By: Rolm Baptise M.D.   On: 02/23/2019 21:44   Dg Chest Portable 1 View  Result Date: 02/23/2019 IMPRESSION: Moderate changes of COPD and chronic bronchitis. No acute abnormality. Electronically Signed   By: Claudie Revering M.D.   On: 02/23/2019 14:43   Dg Abd Portable 2 Views  Result Date: 02/23/2019 IMPRESSION: 1. Nonobstructed gas pattern. 2. Questionable lucency at the epigastric region, consider decubitus view or CT to exclude intraperitoneal air. Electronically Signed   By: Donavan Foil M.D.   On: 02/23/2019 15:20    Assessment and Plan:   1.  Preprocedure cardiac risk ratification: -GI planning for EGD secondary to ongoing gastric and duodenal ulcers -Patient previously has undergone this procedure at [facility] in 08/2018 without cardiac complication  -Per Revised Cardiac Index patient is low risk for noncardiac procedure -Echo is pending and await for further recommendations as outlined below  2.  PAF: -Details of this are somewhat unclear though EKG read from Care Everywhere from 07/25/2017 reports A. fib with EKG from 08/15/2018 showing possible A. fib with RVR -Actual EKGs are not available for review -Has not been maintained on oral anticoagulation in the setting of ongoing gastric/duodenal ulcers under the recommendation of [facility] -There is no indication for aspirin usage in the setting of A. fib, therefore would not recommend that this be resumed -Maintaining sinus rhythm with a bradycardic rate which is consistent with his rates over the years at [facility]  3.  Elevated high-sensitivity troponin/abnormal  EKG: -Notes from care everywhere indicate patient was previously noted to have inferolateral T wave inversion -Minimally elevated high-sensitivity troponin is nonspecific and not consistent with ACS and likely supply demand ischemia in the setting of ongoing cocaine use, acute on chronic kidney injury, and hypokalemia -Await echo as outlined above for further recommendations  4.  Acute on CKD stage II: -Likely prerenal in the setting of dehydration with multiple episodes of daily emesis over the past 7 days  -Improved with gentle hydration  5.  Abnormal thyroid function: -Patient Keith [name] need outpatient follow-up with endocrinology  6.  Epigastric pain/gastric/duodenal ulcers: -PPI and management per GI -Avoid NSAIDs  7.  Polysubstance abuse: -Patient has been cocaine positive at every drug test dating back to 07/2017 and is positive this admission -He denies cocaine use, stating his friends use it around him -He has ongoing tobacco abuse -Complete cessation of illicit substances is recommended  8.  History of syncope: -Felt to be vasovagal versus orthostatic in 08/2018 with positive orthostatic vital signs at that time -Monitor on telemetry  9. Hypokalemia: -Replete to goal of 4.0, currently ordered by IM -Magnesium at goal    For questions or updates, please contact [facility] Please consult [email or web address] for contact info under Cardiology/STEMI.   Signed, Christell Faith, PA-C [place] Pager: [phone] 02/24/2019, 1:41 PM  I have seen and examined this patient with Christell Faith.  Agree with above, note added to reflect my findings.  On exam, regular rhythm, no murmurs, lungs clear.  Presented with nausea, vomiting, abdominal pain.  Plan is for EGD.  At this point, he does not have cardiac related chest pain, shortness of breath.  Would be at low to intermediate risk for a low risk procedure.  No further cardiac testing needed at this time.  Cardiology to sign off.  Please  do not hesitate to call us back if any issues arise.  Keith [name] M. Kace Hartje MD 02/24/2019 2:26 PM

## 2019-02-24 NOTE — Progress Notes (Signed)
PROGRESS NOTE    Keith [name]  [id number] DOB: June 27, 1950 DOA: 02/23/2019 PCP: Patient, No Pcp Per   Brief Narrative:  HPI per Dr. Florina Ou Keith [name] is a 68 y.o. male with medical history significant of PUD . Htn. A.fib not on anticoagulation secondary to his gastric and duodenal ulcers, presented with Chest pain and epigastric pain. Pt reported that he has had two episode of similar episode in past. He has tried to make appt with [facility] and cannot get thru and he has also tried to call [facility] for primary care and cant get thru. Pt is noncompliant and has not taken his bp meds.  Patient also reports that he does not use drugs in fact he went to a gathering few days ago" drug party" where he did not use any substances.  He is cutting down his smoking and 1 pack some 3 days.  Per patient he has not had outpatient follow-up as he could not connect with the specialists at [facility]. Chart review shows patient underwent EGD on 08/15/2018 which showed LA grade D esophagitis, non-bleeding gastric ulcer, congested friable mucosa in the pylorus s/p epinephrine and large duodenal ulcer with duodenal stenosis.plan was for pt ot have repeat egd and he did not follow up. Patient has a past medical history of A. fib, substance abuse, CKD.  ED Course:  Patient in the emergency room is alert awake oriented initial vitals shows blood pressure 100/76, heart rate of 40, respirations of 18, temperature of 98.2.  Chest x-ray shows COPD, abdominal x-ray shows nonobstructive gas pattern.  Covid testing is pending.  Assessment & Plan:   Principal Problem:   Acute epigastric pain Active Problems:   Atrial fibrillation (HCC)   Abdominal pain   Left-sided chest pain   Essential hypertension   Tobacco use disorder   Moderate protein-energy malnutrition (HCC)   Acute kidney injury superimposed on CKD (HCC)   Hypokalemia   Troponin level elevated  1 acute epigastric pain/chest pain Patient complaining of acute  epigastric pain radiating into his chest from his abdomen likely GI related.  Patient with history of peptic ulcer disease with gastric and duodenal ulcers, history of grade D esophagitis per EGD May 2020 with nonbleeding superficial gastric ulcer, localized severe mucosal changes characterized by congestion, erythema, narrowing and friability with spontaneous bleeding to the pylorus past pyloric channel status post injection of epinephrine for hemostasis.  Patient noted was to have repeat EGD however this has not been done.  Patient denies any hematemesis or hematochezia or melena.  Patient with ongoing polysubstance use with cocaine.  Chest x-ray with no acute abnormalities however does have changes of COPD with chronic bronchitis, abdominal ultrasound with nonobstructive gas pattern, question lucency at epigastric region consider decubitus view or CT to exclude intraperitoneal air.  CT abdomen and pelvis subsequently obtained with results pending.  Patient on Protonix drip.  Will change to Protonix 40 mEq IV every 12 hours.  Patient seen by GI who are recommending serial CBCs, IV PPI twice daily, upper endoscopy for further evaluation pending cardiac clearance per GI.  Cardiology consulted for clearance.  Continue clear liquids.  GI following and appreciate input and recommendations.  2.  Paroxysmal atrial fibrillation Rate controlled.  Patient not on anticoagulation secondary to gastric/duodenal ulcers.  Outpatient follow-up.  3.  Hypokalemia K. Dur 40 mEq p.o. every 4 hours x3 doses.   4.  Abnormal thyroid function studies Will need repeat thyroid function studies done in about 4  to 6 weeks in the outpatient setting and if abnormal will need further evaluation by PCP.  5.  Hypertension Blood pressure currently stable.  Follow for now.  6.  Tobacco abuse Tobacco cessation stressed to patient.  Continue nicotine patch.  7.  Acute kidney injury on chronic kidney disease Unclear baseline.  Renal  function improving with hydration.  Check a UA with cultures and sensitivities.  Check urine sodium.  Check a urine creatinine.  Avoid nephrotoxic agents.  Follow.  8.  Moderate protein calorie malnutrition     DVT prophylaxis: SCDs Code Status: Full Family Communication: Updated patient.  No family at bedside. Disposition Plan: To be determined   Consultants:   Gastroenterology: Dr. Bonna Gains 02/24/2019  Cardiology pending  Procedures:   2 D echo pending 02/24/2019  Abdominal ultrasound 02/23/2019  Chest x-ray 02/23/2019  Abdominal films 02/23/2019  CT abdomen and pelvis 02/24/2019  Antimicrobials:   None   Subjective: Patient sitting up in bed eating Jell-O.  Denies any nausea or vomiting today.  Complains of dull epigastric pain.  Complaints with symptoms of belching.  Denies any substernal chest pain.  No shortness of breath.  Objective: Vitals:   02/23/19 1953 02/23/19 2345 02/24/19 0513 02/24/19 0833  BP: (!) 153/91 (!) 148/96 (!) 132/91 (!) 137/93  Pulse: (!) 58 (!) 57 (!) 55 (!) 53  Resp: 14 20 19    Temp: 98 F (36.7 C) 97.7 F (36.5 C) 97.7 F (36.5 C) (!) 97.4 F (36.3 C)  TempSrc: Oral  Oral Oral  SpO2: 99% 100% 98% 100%  Weight:  58.5 kg 58.7 kg   Height:  5\' 7"  (1.702 m)      Intake/Output Summary (Last 24 hours) at 02/24/2019 1539 Last data filed at 02/24/2019 1415 Gross per 24 hour  Intake 1692.07 ml  Output 1700 ml  Net -7.93 ml   Filed Weights   02/23/19 1351 02/23/19 2345 02/24/19 0513  Weight: 54.4 kg 58.5 kg 58.7 kg    Examination:  General exam: NAD Respiratory system: Clear to auscultation. Respiratory effort normal. Cardiovascular system: S1 & S2 heard, RRR. No JVD, murmurs, rubs, gallops or clicks. No pedal edema. Gastrointestinal system: Abdomen is nondistended, soft and some tenderness to palpation in the epigastric region.  Positive bowel sounds.  No rebound.  No guarding.  Central nervous system: Alert and oriented.  No focal neurological deficits. Extremities: Symmetric 5 x 5 power. Skin: No rashes, lesions or ulcers Psychiatry: Judgement and insight appear normal. Mood & affect appropriate.     Data Reviewed: I have personally reviewed following labs and imaging studies  CBC: Recent Labs  Lab 02/23/19 1414 02/24/19 0834  WBC 12.7* 8.3  NEUTROABS  --  4.5  HGB 14.7 11.8*  HCT 42.1 34.5*  MCV 81.4 84.4  PLT 334 [id number]   Basic Metabolic Panel: Recent Labs  Lab 02/23/19 1414 02/24/19 0611  NA 135 132*  K 3.0* 2.6*  CL 85* 99  CO2 31 25  GLUCOSE 111* 89  BUN 28* 26*  CREATININE 2.70* 1.60*  CALCIUM 9.6 7.6*  MG 2.1  --    GFR: Estimated Creatinine Clearance: 36.7 mL/min (A) (by C-G formula based on SCr of 1.6 mg/dL (H)). Liver Function Tests: Recent Labs  Lab 02/23/19 1414 02/24/19 0611  AST 25 16  ALT 11 8  ALKPHOS 79 54  BILITOT 0.7 0.8  PROT 8.7* 6.2*  ALBUMIN 4.7 3.2*   Recent Labs  Lab 02/23/19 1414 02/24/19 0834  LIPASE 44  46   No results for input(s): AMMONIA in the last 168 hours. Coagulation Profile: No results for input(s): INR, PROTIME in the last 168 hours. Cardiac Enzymes: Recent Labs  Lab 02/24/19 0611  CKTOTAL 138   BNP (last 3 results) No results for input(s): PROBNP in the last 8760 hours. HbA1C: No results for input(s): HGBA1C in the last 72 hours. CBG: No results for input(s): GLUCAP in the last 168 hours. Lipid Profile: No results for input(s): CHOL, HDL, LDLCALC, TRIG, CHOLHDL, LDLDIRECT in the last 72 hours. Thyroid Function Tests: Recent Labs    02/23/19 1414 02/23/19 1812  TSH 4.885*  --   FREET4  --  1.45*   Anemia Panel: No results for input(s): VITAMINB12, FOLATE, FERRITIN, TIBC, IRON, RETICCTPCT in the last 72 hours. Sepsis Labs: Recent Labs  Lab 02/23/19 2051 02/23/19 2142  LATICACIDVEN 1.0 1.2    Recent Results (from the past 240 hour(s))  SARS CORONAVIRUS 2 (TAT 6-24 HRS) Nasopharyngeal Nasopharyngeal Swab      Status: None   Collection Time: 02/23/19  4:30 PM   Specimen: Nasopharyngeal Swab  Result Value Ref Range Status   SARS Coronavirus 2 NEGATIVE NEGATIVE Final    Comment: (NOTE) SARS-CoV-2 target nucleic acids are NOT DETECTED. The SARS-CoV-2 RNA is generally detectable in upper and lower respiratory specimens during the acute phase of infection. Negative results do not preclude SARS-CoV-2 infection, do not rule out co-infections with other pathogens, and should not be used as the sole basis for treatment or other patient management decisions. Negative results must be combined with clinical observations, patient history, and epidemiological information. The expected result is Negative. Fact Sheet for Patients: SugarRoll.be Fact Sheet for Healthcare Providers: https://www.woods-mathews.com/ This test is not yet approved or cleared by the [place] FDA and  has been authorized for detection and/or diagnosis of SARS-CoV-2 by FDA under an Emergency Use Authorization (EUA). This EUA will remain  in effect (meaning this test can be used) for the duration of the COVID-19 declaration under Section 56 4(b)(1) of the Act, 21 U.S.C. section 360bbb-3(b)(1), unless the authorization is terminated or revoked sooner. Performed at [facility], [place] [address] [place], [place] [address]          Radiology Studies: US Abdomen Complete  Result Date: 02/23/2019 CLINICAL DATA:  Abdominal pain EXAM: ABDOMEN ULTRASOUND COMPLETE COMPARISON:  None. FINDINGS: Gallbladder: No gallstones or wall thickening visualized. No sonographic Murphy sign noted by sonographer. Common bile duct: Diameter: Normal caliber, 3 mm Liver: 1.6 cm echogenic area within the right hepatic lobe most consistent with hemangioma. Normal echotexture. No biliary ductal dilatation. Portal vein is patent on color Doppler imaging with normal direction of blood flow towards the liver.  IVC: No abnormality visualized. Pancreas: Visualized portion unremarkable. Spleen: Size and appearance within normal limits. Right Kidney: Length: 8.9 cm. Echogenicity within normal limits. No mass or hydronephrosis visualized. Left Kidney: Length: 10.3 cm. Echogenicity within normal limits. No mass or hydronephrosis visualized. Abdominal aorta: No aneurysm visualized. Other findings: None. IMPRESSION: No acute findings. 1.6 cm echogenic area within the right hepatic lobe most compatible with hemangioma. Electronically Signed   By: Rolm Baptise M.D.   On: 02/23/2019 21:44   Dg Chest Portable 1 View  Result Date: 02/23/2019 CLINICAL DATA:  Chest pain and near syncopal episode following a bowel movement. Nausea and vomiting for the past 2 days. Smoker. EXAM: PORTABLE CHEST 1 VIEW COMPARISON:  None. FINDINGS: Normal sized heart. Tortuous aorta. Moderate central peribronchial  thickening. Hyperexpanded, clear lungs. Unremarkable bones. IMPRESSION: Moderate changes of COPD and chronic bronchitis. No acute abnormality. Electronically Signed   By: Claudie Revering M.D.   On: 02/23/2019 14:43   Dg Abd Portable 2 Views  Result Date: 02/23/2019 CLINICAL DATA:  Abdomen pain, chest pain nausea and vomiting EXAM: PORTABLE ABDOMEN - 2 VIEW COMPARISON:  None. FINDINGS: Visualized lung bases are clear. Questionable lucency at the epigastric region. Nonobstructed bowel-gas pattern. High-riding colon at the right upper quadrant. IMPRESSION: 1. Nonobstructed gas pattern. 2. Questionable lucency at the epigastric region, consider decubitus view or CT to exclude intraperitoneal air. Electronically Signed   By: Donavan Foil M.D.   On: 02/23/2019 15:20        Scheduled Meds: . feeding supplement  1 Container Oral TID BM  . [START ON 02/25/2019] multivitamin with minerals  1 tablet Oral Daily  . nicotine  14 mg Transdermal Daily  . pantoprazole (PROTONIX) IV  40 mg Intravenous Q12H  . potassium chloride  40 mEq Oral Q4H    Continuous Infusions: . lactated ringers 100 mL/hr at 02/24/19 0634     LOS: 1 day    Time spent: 35 minutes    Irine Seal, MD [facility]  If 7PM-7AM, please contact night-coverage [email or web address] 02/24/2019, 3:39 PM

## 2019-02-24 NOTE — Consult Note (Signed)
Vonda Antigua, MD [address], [place], [place], [place], [address] [address], [place], [place], [place], [address] Phone: [phone]  Fax: [phone]  Consultation  Referring Provider:     Dr. Posey Pronto Primary Care Physician:  Patient, No Pcp Per Reason for Consultation:    Nausea vomiting, history of gastric ulcers  Date of Admission:  02/23/2019 Date of Consultation:  02/24/2019         HPI:   MARKEITH BORDONARO is a 68 y.o. male with history of polysubstance abuse, currently cocaine positive, history of gastric and duodenal ulcers, presenting with epigastric pain.  Dull, 5/10, nonradiating, intermittently ongoing for 1 to 2 months.  Patient has mildly elevated troponin.  Patient was last seen by GI in May 2020 when he was admitted with nausea and vomiting.  EGD showed grade D esophagitis.  1 nonbleeding superficial gastric ulcer, 3 millimeters in size.  Localized severe mucosal changes characterized by congestion erythema, narrowing and friability with spontaneous bleeding in the pylorus/pyloric channel.  Injected with epinephrine for hemostasis.  Unable to traverse the duodenum due to stenosis and distortion of the duodenal bulb.  Biopsies were not able to be obtained.  Recommendations were to repeat EGD in 8 weeks to reassess for ulcer healing.  As per hospitalization note from May 2020, patient initially presented with similar symptoms in April 2019 with EGD at that time showing grade D esophagitis, diffuse gastritis with multiple deep gastric ulcers, mild stenosis of the pylorus due to pyloric channel ulcer and mild duodenitis.  This was traversed.  Biopsies were obtained from the ulcer edge.  Diffuse mild inflammation, and edema were reported in the second portion of the duodenum.  Biopsies were taken.    Duodenal mucosa was histologically unremarkable.  Gastric mucosa showed chronic atrophic gastritis with focal intestinal metaplasia.  It also reported "H. pylori  immunostain demonstrates focal staining within the lymphoid aggregate, definitive intact organisms are not identified ".  Repeat EGD was recommended but patient did not follow-up at that time.    Past Medical History:  Diagnosis Date  . Hypertension   . Paroxysmal atrial fibrillation (HCC)   . Tobacco abuse     History reviewed. No pertinent surgical history.  Prior to Admission medications   Medication Sig Start Date End Date Taking? Authorizing Provider  albuterol (VENTOLIN HFA) 108 (90 Base) MCG/ACT inhaler Inhale 2 puffs into the lungs every 6 (six) hours as needed. 08/16/18 08/16/19 Yes [provider]  amLODipine (NORVASC) 10 MG tablet Take 10 mg by mouth daily. 08/16/18 08/16/19 Yes [provider]    History reviewed. No pertinent family history.   Social History   Tobacco Use  . Smoking status: Current Every Day Smoker    Types: Cigarettes  Substance Use Topics  . Alcohol use: Not Currently  . Drug use: Not on file    Allergies as of 02/23/2019  . (No Known Allergies)    Review of Systems:    All systems reviewed and negative except where noted in HPI.   Physical Exam:  Vital signs in last 24 hours: Vitals:   02/23/19 1953 02/23/19 2345 02/24/19 0513 02/24/19 0833  BP: (!) 153/91 (!) 148/96 (!) 132/91 (!) 137/93  Pulse: (!) 58 (!) 57 (!) 55 (!) 53  Resp: 14 20 19    Temp: 98 F (36.7 C) 97.7 F (36.5 C) 97.7 F (36.5 C) (!) 97.4 F (36.3 C)  TempSrc: Oral  Oral Oral  SpO2: 99% 100%  98% 100%  Weight:  58.5 kg 58.7 kg   Height:  5\' 7"  (1.702 m)     Last BM Date: 02/16/19 General:   Pleasant, cooperative in NAD Head:  Normocephalic and atraumatic. Eyes:   No icterus.   Conjunctiva pink. PERRLA. Ears:  Normal auditory acuity. Neck:  Supple; no masses or thyroidomegaly Lungs: Respirations even and unlabored. Lungs clear to auscultation bilaterally.   No wheezes, crackles, or rhonchi.  Abdomen:  Soft, nondistended, nontender. Normal bowel  sounds. No appreciable masses or hepatomegaly.  No rebound or guarding.  Neurologic:  Alert and oriented x3;  grossly normal neurologically. Skin:  Intact without significant lesions or rashes. Cervical Nodes:  No significant cervical adenopathy. Psych:  Alert and cooperative. Normal affect.  LAB RESULTS: Recent Labs    02/23/19 1414 02/24/19 0834  WBC 12.7* 8.3  HGB 14.7 11.8*  HCT 42.1 34.5*  PLT 334 250   BMET Recent Labs    02/23/19 1414 02/24/19 0611  NA 135 132*  K 3.0* 2.6*  CL 85* 99  CO2 31 25  GLUCOSE 111* 89  BUN 28* 26*  CREATININE 2.70* 1.60*  CALCIUM 9.6 7.6*   LFT Recent Labs    02/24/19 0611  PROT 6.2*  ALBUMIN 3.2*  AST 16  ALT 8  ALKPHOS 54  BILITOT 0.8   PT/INR No results for input(s): LABPROT, INR in the last 72 hours.  STUDIES: US Abdomen Complete  Result Date: 02/23/2019 CLINICAL DATA:  Abdominal pain EXAM: ABDOMEN ULTRASOUND COMPLETE COMPARISON:  None. FINDINGS: Gallbladder: No gallstones or wall thickening visualized. No sonographic Murphy sign noted by sonographer. Common bile duct: Diameter: Normal caliber, 3 mm Liver: 1.6 cm echogenic area within the right hepatic lobe most consistent with hemangioma. Normal echotexture. No biliary ductal dilatation. Portal vein is patent on color Doppler imaging with normal direction of blood flow towards the liver. IVC: No abnormality visualized. Pancreas: Visualized portion unremarkable. Spleen: Size and appearance within normal limits. Right Kidney: Length: 8.9 cm. Echogenicity within normal limits. No mass or hydronephrosis visualized. Left Kidney: Length: 10.3 cm. Echogenicity within normal limits. No mass or hydronephrosis visualized. Abdominal aorta: No aneurysm visualized. Other findings: None. IMPRESSION: No acute findings. 1.6 cm echogenic area within the right hepatic lobe most compatible with hemangioma. Electronically Signed   By: Rolm Baptise M.D.   On: 02/23/2019 21:44   Dg Chest Portable 1  View  Result Date: 02/23/2019 CLINICAL DATA:  Chest pain and near syncopal episode following a bowel movement. Nausea and vomiting for the past 2 days. Smoker. EXAM: PORTABLE CHEST 1 VIEW COMPARISON:  None. FINDINGS: Normal sized heart. Tortuous aorta. Moderate central peribronchial thickening. Hyperexpanded, clear lungs. Unremarkable bones. IMPRESSION: Moderate changes of COPD and chronic bronchitis. No acute abnormality. Electronically Signed   By: Claudie Revering M.D.   On: 02/23/2019 14:43   Dg Abd Portable 2 Views  Result Date: 02/23/2019 CLINICAL DATA:  Abdomen pain, chest pain nausea and vomiting EXAM: PORTABLE ABDOMEN - 2 VIEW COMPARISON:  None. FINDINGS: Visualized lung bases are clear. Questionable lucency at the epigastric region. Nonobstructed bowel-gas pattern. High-riding colon at the right upper quadrant. IMPRESSION: 1. Nonobstructed gas pattern. 2. Questionable lucency at the epigastric region, consider decubitus view or CT to exclude intraperitoneal air. Electronically Signed   By: Donavan Foil M.D.   On: 02/23/2019 15:20      Impression / Plan:   WINIFRED CAULK is a 68 y.o. y/o male with polysubstance abuse, cocaine positive, history  of gastric and duodenal ulcers, last endoscopy in May 2020 showing pyloric stenosis and gastric ulcers admitted with epigastric pain  Patient symptoms are likely ongoing from underlying ulcers given that she continues to use cocaine which is probably the source of his ulcers  Patient will need cardiac clearance given elevated troponin prior to endoscopy  With positive cocaine, timing of procedure will also depend on anesthesia staff, and they typically recommend cocaine negative urine, or 3 days post cocaine positive urine prior to endoscopy  Patient will be followed by Dr. Allen Norris over the week and timing of procedure determined after cardiac clearance and discussion with anesthesia in regard to his positive cocaine  PPI IV twice daily  Continue  serial CBCs and transfuse PRN Avoid NSAIDs Maintain 2 large-bore IV lines Please page GI with any acute hemodynamic changes, or signs of active GI bleeding  Substance abuse discouraged and risks of ongoing cocaine use discussed with patient  No evidence of active GI bleeding at this time Hemoglobin stable  Please replace and recheck electrolytes, as patient had potassium of 2.6 this morning.  Thank you for involving me in the care of this patient.      LOS: 1 day   Virgel Manifold, MD  02/24/2019, 9:04 AM

## 2019-02-24 NOTE — Plan of Care (Signed)
  Problem: Education: Goal: Knowledge of General Education information will improve Description: Including pain rating scale, medication(s)/side effects and non-pharmacologic comfort measures Outcome: Progressing   Problem: Pain Managment: Goal: General experience of comfort will improve Outcome: Progressing   Problem: Safety: Goal: Ability to remain free from injury will improve Outcome: Progressing   

## 2019-02-25 DIAGNOSIS — R778 Other specified abnormalities of plasma proteins: Secondary | ICD-10-CM

## 2019-02-25 DIAGNOSIS — E538 Deficiency of other specified B group vitamins: Secondary | ICD-10-CM | POA: Diagnosis present

## 2019-02-25 DIAGNOSIS — K5900 Constipation, unspecified: Secondary | ICD-10-CM

## 2019-02-25 LAB — FERRITIN: Ferritin: 155 ng/mL (ref 24–336)

## 2019-02-25 LAB — URINE CULTURE: Culture: <10000 — AB

## 2019-02-25 LAB — CBC
HCT: 31.6 % — ABNORMAL LOW (ref 39.0–52.0)
Hemoglobin: 10.6 g/dL — ABNORMAL LOW (ref 13.0–17.0)
MCH: 28.4 pg (ref 26.0–34.0)
MCHC: 33.5 g/dL (ref 30.0–36.0)
MCV: 84.7 fL (ref 80.0–100.0)
Platelets: 241 10*3/uL (ref 150–400)
RBC: 3.73 MIL/uL — ABNORMAL LOW (ref 4.22–5.81)
RDW: 14.6 % (ref 11.5–15.5)
WBC: 6.4 10*3/uL (ref 4.0–10.5)
nRBC: 0 % (ref 0.0–0.2)

## 2019-02-25 LAB — IRON AND TIBC
Iron: 90 ug/dL (ref 45–182)
Saturation Ratios: 36 % (ref 17.9–39.5)
TIBC: 250 ug/dL (ref 250–450)
UIBC: 160 ug/dL

## 2019-02-25 LAB — BASIC METABOLIC PANEL
Anion gap: 6 (ref 5–15)
BUN: 16 mg/dL (ref 8–23)
CO2: 23 mmol/L (ref 22–32)
Calcium: 7.8 mg/dL — ABNORMAL LOW (ref 8.9–10.3)
Chloride: 106 mmol/L (ref 98–111)
Creatinine, Ser: 1.26 mg/dL — ABNORMAL HIGH (ref 0.61–1.24)
GFR calc Af Amer: >60 mL/min (ref >60)
GFR calc non Af Amer: 58 mL/min — ABNORMAL LOW (ref >60)
Glucose, Bld: 85 mg/dL (ref 70–99)
Potassium: 4 mmol/L (ref 3.5–5.1)
Sodium: 135 mmol/L (ref 135–145)

## 2019-02-25 LAB — MAGNESIUM: Magnesium: 1.8 mg/dL (ref 1.7–2.4)

## 2019-02-25 LAB — VITAMIN B12: Vitamin B-12: 173 pg/mL — ABNORMAL LOW (ref 180–914)

## 2019-02-25 LAB — FOLATE: Folate: 11.1 ng/mL (ref >5.9)

## 2019-02-25 MED ORDER — POLYETHYLENE GLYCOL 3350 17 G PO PACK
17.0000 g | PACK | Freq: Every day | ORAL | Status: DC
  Administered 2019-02-25 – 2019-02-26 (×2): 17 g via ORAL
  Filled 2019-02-25: qty 1

## 2019-02-25 MED ORDER — TRIAMCINOLONE ACETONIDE 0.1 % EX CREA
TOPICAL_CREAM | Freq: Three times a day (TID) | CUTANEOUS | Status: DC
  Administered 2019-02-25 – 2019-03-05 (×5): via TOPICAL
  Administered 2019-03-05: 1 via TOPICAL
  Administered 2019-03-06: 09:00:00 via TOPICAL
  Filled 2019-02-25: qty 15

## 2019-02-25 MED ORDER — AMLODIPINE BESYLATE 10 MG PO TABS
10.0000 mg | ORAL_TABLET | Freq: Every day | ORAL | Status: DC
  Administered 2019-02-25 – 2019-02-26 (×2): 10 mg via ORAL
  Filled 2019-02-25: qty 1

## 2019-02-25 MED ORDER — CYANOCOBALAMIN 1000 MCG/ML IJ SOLN
1000.0000 ug | Freq: Every day | INTRAMUSCULAR | Status: DC
  Administered 2019-02-25 – 2019-03-06 (×9): 1000 ug via SUBCUTANEOUS
  Filled 2019-02-25 (×10): qty 1

## 2019-02-25 MED ORDER — MAGNESIUM SULFATE 2 GM/50ML IV SOLN
2.0000 g | Freq: Once | INTRAVENOUS | Status: AC
  Administered 2019-02-25: 2 g via INTRAVENOUS
  Filled 2019-02-25: qty 50

## 2019-02-25 MED ORDER — SORBITOL 70 % SOLN
30.0000 mL | Status: AC
  Administered 2019-02-25 (×2): 30 mL via ORAL
  Filled 2019-02-25 (×2): qty 30

## 2019-02-25 MED ORDER — SENNOSIDES-DOCUSATE SODIUM 8.6-50 MG PO TABS
1.0000 | ORAL_TABLET | Freq: Two times a day (BID) | ORAL | Status: DC
  Administered 2019-02-26 (×3): 1 via ORAL
  Filled 2019-02-25 (×3): qty 1

## 2019-02-25 NOTE — Progress Notes (Signed)
Lucilla Lame, MD [facility]   [address], [place], [place] [address] Phone: [phone] Fax : [phone]   Subjective: This patient has been a tolerating clear liquid diet.  The patient has no complaints at the present time except some epigastric pain that he reports to be from his vomiting.   Objective: Vital signs in last 24 hours: Vitals:   02/24/19 1700 02/24/19 2146 02/25/19 0433 02/25/19 0826  BP: (!) 150/83 (!) 152/84 (!) 151/93 (!) 164/98  Pulse: (!) 45 60 (!) 50 (!) 50  Resp:  19 17 18   Temp: (!) 97.4 F (36.3 C) 97.8 F (36.6 C) 97.7 F (36.5 C) 97.7 F (36.5 C)  TempSrc: Oral Oral Oral Oral  SpO2: 99% 100% 100% 99%  Weight:   61.6 kg   Height:       Weight change: 7.167 kg  Intake/Output Summary (Last 24 hours) at 02/25/2019 1326 Last data filed at 02/25/2019 [id number] Gross per 24 hour  Intake 1516.55 ml  Output 3700 ml  Net -2183.45 ml     Exam: Heart:: Regular rate and rhythm, S1S2 present or without murmur or extra heart sounds Lungs: normal and clear to auscultation and percussion Abdomen: soft, positive epigastric tenderness on the sternum, normal bowel sounds   Lab Results: @LABTEST2 @ Micro Results: Recent Results (from the past 240 hour(s))  SARS CORONAVIRUS 2 (TAT 6-24 HRS) Nasopharyngeal Nasopharyngeal Swab     Status: None   Collection Time: 02/23/19  4:30 PM   Specimen: Nasopharyngeal Swab  Result Value Ref Range Status   SARS Coronavirus 2 NEGATIVE NEGATIVE Final    Comment: (NOTE) SARS-CoV-2 target nucleic acids are NOT DETECTED. The SARS-CoV-2 RNA is generally detectable in upper and lower respiratory specimens during the acute phase of infection. Negative results do not preclude SARS-CoV-2 infection, do not rule out co-infections with other pathogens, and should not be used as the sole basis for treatment or other patient management decisions. Negative results must be combined with clinical observations, patient history, and  epidemiological information. The expected result is Negative. Fact Sheet for Patients: SugarRoll.be Fact Sheet for Healthcare Providers: https://www.woods-mathews.com/ This test is not yet approved or cleared by the [place] FDA and  has been authorized for detection and/or diagnosis of SARS-CoV-2 by FDA under an Emergency Use Authorization (EUA). This EUA will remain  in effect (meaning this test can be used) for the duration of the COVID-19 declaration under Section 56 4(b)(1) of the Act, 21 U.S.C. section 360bbb-3(b)(1), unless the authorization is terminated or revoked sooner. Performed at [facility], [place] [address], [place], [place] [address]    Studies/Results: Ct Abdomen Pelvis Wo Contrast  Result Date: 02/24/2019 CLINICAL DATA:  Upper abdominal and pelvic pain for several weeks with nausea and vomiting. EXAM: CT ABDOMEN AND PELVIS WITHOUT CONTRAST TECHNIQUE: Multidetector CT imaging of the abdomen and pelvis was performed following the standard protocol without IV contrast. COMPARISON:  Abdominal ultrasound from 02/23/2019 FINDINGS: Lower chest: Dependent atelectasis in both lower lobes. Hepatobiliary: Unremarkable Pancreas: Equivocal indistinct margins of the pancreas, likely incidental especially in light of the patient's normal lipase level today. Spleen: Unremarkable Adrenals/Urinary Tract: 1.6 cm nonspecific hypodense lesion in the right mid kidney. Enhancement characteristics are on study. No urinary tract calculi are identified. Urinary bladder unremarkable. Stomach/Bowel: The stomach is somewhat distended with contrast medium. Contrast medium makes its way through to the colon. Prominent stool throughout the colon favors constipation. Scattered sigmoid colon diverticula. Normal appendix. Vascular/Lymphatic: Aortoiliac atherosclerotic  vascular disease. No pathologic adenopathy is identified. Reproductive: Unremarkable Other: No  supplemental non-categorized findings. Musculoskeletal: Unremarkable IMPRESSION: 1. A specific cause for the patient's abdominal pain is not identified. 2. Prominent stool throughout the colon favors constipation. Scattered sigmoid colon diverticula. 3. Aortic atherosclerosis. Aortic Atherosclerosis (ICD10-I70.0). Electronically Signed   By: Van Clines M.D.   On: 02/24/2019 19:52   US Abdomen Complete  Result Date: 02/23/2019 CLINICAL DATA:  Abdominal pain EXAM: ABDOMEN ULTRASOUND COMPLETE COMPARISON:  None. FINDINGS: Gallbladder: No gallstones or wall thickening visualized. No sonographic Murphy sign noted by sonographer. Common bile duct: Diameter: Normal caliber, 3 mm Liver: 1.6 cm echogenic area within the right hepatic lobe most consistent with hemangioma. Normal echotexture. No biliary ductal dilatation. Portal vein is patent on color Doppler imaging with normal direction of blood flow towards the liver. IVC: No abnormality visualized. Pancreas: Visualized portion unremarkable. Spleen: Size and appearance within normal limits. Right Kidney: Length: 8.9 cm. Echogenicity within normal limits. No mass or hydronephrosis visualized. Left Kidney: Length: 10.3 cm. Echogenicity within normal limits. No mass or hydronephrosis visualized. Abdominal aorta: No aneurysm visualized. Other findings: None. IMPRESSION: No acute findings. 1.6 cm echogenic area within the right hepatic lobe most compatible with hemangioma. Electronically Signed   By: Rolm Baptise M.D.   On: 02/23/2019 21:44   Dg Chest Portable 1 View  Result Date: 02/23/2019 CLINICAL DATA:  Chest pain and near syncopal episode following a bowel movement. Nausea and vomiting for the past 2 days. Smoker. EXAM: PORTABLE CHEST 1 VIEW COMPARISON:  None. FINDINGS: Normal sized heart. Tortuous aorta. Moderate central peribronchial thickening. Hyperexpanded, clear lungs. Unremarkable bones. IMPRESSION: Moderate changes of COPD and chronic bronchitis.  No acute abnormality. Electronically Signed   By: Claudie Revering M.D.   On: 02/23/2019 14:43   Dg Abd Portable 2 Views  Result Date: 02/23/2019 CLINICAL DATA:  Abdomen pain, chest pain nausea and vomiting EXAM: PORTABLE ABDOMEN - 2 VIEW COMPARISON:  None. FINDINGS: Visualized lung bases are clear. Questionable lucency at the epigastric region. Nonobstructed bowel-gas pattern. High-riding colon at the right upper quadrant. IMPRESSION: 1. Nonobstructed gas pattern. 2. Questionable lucency at the epigastric region, consider decubitus view or CT to exclude intraperitoneal air. Electronically Signed   By: Donavan Foil M.D.   On: 02/23/2019 15:20   Medications: I have reviewed the patient's current medications. Scheduled Meds:  feeding supplement  1 Container Oral TID BM   hydrocerin   Topical BID   multivitamin with minerals  1 tablet Oral Daily   nicotine  14 mg Transdermal Daily   pantoprazole (PROTONIX) IV  40 mg Intravenous Q12H   triamcinolone cream   Topical TID   Continuous Infusions:  lactated ringers 100 mL/hr at 02/25/19 0316   PRN Meds:.albuterol   Assessment: Principal Problem:   Acute epigastric pain Active Problems:   Atrial fibrillation (HCC)   Abdominal pain   Left-sided chest pain   Essential hypertension   Tobacco use disorder   Moderate protein-energy malnutrition (HCC)   Acute kidney injury superimposed on CKD (HCC)   Hypokalemia   Troponin level elevated   Dehydration   Acute gastritis without hemorrhage    Plan: This patient is in need of a EGD due to his nausea and vomiting with a history of pyloric stenosis and peptic ulcer disease.  The patient was positive for cocaine and we are going to check his cocaine level tomorrow to make sure it is negative before we do the procedure tomorrow late morning  early afternoon.  The patient has been explained the plan of waiting for his cocaine level to be down to undetectable prior to doing any procedures.  He  states he understands the plan and agrees with it.   LOS: 2 days   Lucilla Lame 02/25/2019, 1:26 PM Pager [phone] 7am-5pm  Check AMION for 5pm -7am coverage and on weekends

## 2019-02-25 NOTE — Progress Notes (Addendum)
PROGRESS NOTE    Keith [name]  [id number] DOB: Oct 31, 1950 DOA: 02/23/2019 PCP: Patient, No Pcp Per   Brief Narrative:  HPI per Dr. Florina Ou Keith [name] is a 68 y.o. male with medical history significant of PUD . Htn. A.fib not on anticoagulation secondary to his gastric and duodenal ulcers, presented with Chest pain and epigastric pain. Pt reported that he has had two episode of similar episode in past. He has tried to make appt with [facility] and cannot get thru and he has also tried to call [facility] for primary care and cant get thru. Pt is noncompliant and has not taken his bp meds.  Patient also reports that he does not use drugs in fact he went to a gathering few days ago" drug party" where he did not use any substances.  He is cutting down his smoking and 1 pack some 3 days.  Per patient he has not had outpatient follow-up as he could not connect with the specialists at [facility]. Chart review shows patient underwent EGD on 08/15/2018 which showed LA grade D esophagitis, non-bleeding gastric ulcer, congested friable mucosa in the pylorus s/p epinephrine and large duodenal ulcer with duodenal stenosis.plan was for pt ot have repeat egd and he did not follow up. Patient has a past medical history of A. fib, substance abuse, CKD.  ED Course:  Patient in the emergency room is alert awake oriented initial vitals shows blood pressure 100/76, heart rate of 40, respirations of 18, temperature of 98.2.  Chest x-ray shows COPD, abdominal x-ray shows nonobstructive gas pattern.  Covid testing is pending.  Assessment & Plan:   Principal Problem:   Acute epigastric pain Active Problems:   Atrial fibrillation (HCC)   Abdominal pain   Left-sided chest pain   Essential hypertension   Tobacco use disorder   Moderate protein-energy malnutrition (HCC)   Acute kidney injury superimposed on CKD (HCC)   Hypokalemia   Troponin level elevated   Dehydration   Acute gastritis without hemorrhage   Vitamin B12  deficiency  1 acute epigastric pain/chest pain Patient complaining of acute epigastric pain radiating into his chest from his abdomen likely GI related.  Patient with history of peptic ulcer disease with gastric and duodenal ulcers, history of grade D esophagitis per EGD May 2020 with nonbleeding superficial gastric ulcer, localized severe mucosal changes characterized by congestion, erythema, narrowing and friability with spontaneous bleeding to the pylorus past pyloric channel status post injection of epinephrine for hemostasis.  Patient noted was to have repeat EGD however this has not been done.  Patient denies any hematemesis or hematochezia or melena.  Patient with ongoing polysubstance use with cocaine.  Chest x-ray with no acute abnormalities however does have changes of COPD with chronic bronchitis, abdominal ultrasound with nonobstructive gas pattern, question lucency at epigastric region consider decubitus view or CT to exclude intraperitoneal air.  CT abdomen and pelvis subsequently obtained with prominent stool throughout the colon favoring constipation.  Scattered sigmoid colon diverticula.  Aortic atherosclerosis.  Continue Protonix 40 mg IV every 12 hours.  H&H stable.  Patient seen by cardiology and cleared for upper endoscopy.  Patient seen by GI who are recommending need for EGD which hopefully can be done late tomorrow morning or early afternoon after repeat cocaine levels are obtained.  Continue clears.  GI following and appreciate input and recommendations.  2.  Paroxysmal atrial fibrillation Rate controlled.  Patient not on anticoagulation secondary to gastric/duodenal ulcers.  Outpatient follow-up with  cardiology..  3.  Hypokalemia Repleted.  Potassium of 4.0.  Magnesium at 1.8.  We will give magnesium sulfate 2 g IV x1.  4.  Mildly elevated high-sensitivity troponin Troponin levels flattened.  Doubt if ACS.  Likely secondary to demand ischemia.  Patient seen by cardiology who are  recommending outpatient stress test.  5.  Abnormal thyroid function studies Will need repeat thyroid function studies done in about 4 to 6 weeks in the outpatient setting and if abnormal will need further evaluation by PCP.  6.  Hypertension Blood pressure elevated today.  Decrease IV fluids to 75 cc/h.  Resume home regimen of Norvasc 10 mg daily.  Follow.   7.  Tobacco abuse Tobacco cessation stressed to patient.  Continue nicotine patch.  8.  Acute kidney injury on chronic kidney disease Unclear baseline.  Renal function improving with hydration.  Urinalysis nitrite negative, leukocytes negative, negative for protein.  Urine sodium was 21.  Urine creatinine of 90.  Avoid nephrotoxins.  Decrease IV fluids to 75 cc/h.  Follow.   9.  Constipation Sorbitol p.o. x1 and may repeat in 2 hours if no results.  Will place on Senokot-S nightly and MiraLAX daily.  10.  Moderate protein calorie malnutrition  11.  Vitamin B12 deficiency Vitamin B12 levels at 173.  Place on vitamin B12 1000 MCG subcutaneously daily x1 week, and then weekly x1 month, and then monthly.  Will need outpatient follow-up with PCP.     DVT prophylaxis: SCDs Code Status: Full Family Communication: Updated patient.  No family at bedside. Disposition Plan: To be determined   Consultants:   Gastroenterology: Dr. Bonna Gains 02/24/2019  Cardiology: Dr. Curt Bears 02/24/2019  Procedures:   2 D echo pending 02/24/2019  Abdominal ultrasound 02/23/2019  Chest x-ray 02/23/2019  Abdominal films 02/23/2019  CT abdomen and pelvis 02/24/2019  Antimicrobials:   None   Subjective: Patient sitting up in bed.  Tolerating clears.  States he is hungry.  States epigastric abdominal pain improved.  No nausea or vomiting today.   Objective: Vitals:   02/24/19 1700 02/24/19 2146 02/25/19 0433 02/25/19 0826  BP: (!) 150/83 (!) 152/84 (!) 151/93 (!) 164/98  Pulse: (!) 45 60 (!) 50 (!) 50  Resp:  19 17 18   Temp: (!) 97.4  F (36.3 C) 97.8 F (36.6 C) 97.7 F (36.5 C) 97.7 F (36.5 C)  TempSrc: Oral Oral Oral Oral  SpO2: 99% 100% 100% 99%  Weight:   61.6 kg   Height:        Intake/Output Summary (Last 24 hours) at 02/25/2019 1522 Last data filed at 02/25/2019 1458 Gross per 24 hour  Intake 2476.55 ml  Output 3450 ml  Net -973.45 ml   Filed Weights   02/23/19 2345 02/24/19 0513 02/25/19 0433  Weight: 58.5 kg 58.7 kg 61.6 kg    Examination:  General exam: NAD Respiratory system: Clear to auscultation bilaterally.  No wheezes, no crackles, no rhonchi.  Normal respiratory effort.  Speaking in full sentences.  Cardiovascular system: Regular rate and rhythm no murmurs rubs or gallops.  No JVD.  No lower extremity edema. Gastrointestinal system: Abdomen is soft, nontender, nondistended, positive bowel sounds.  No rebound.  No guarding. Central nervous system: Alert and oriented. No focal neurological deficits. Extremities: Symmetric 5 x 5 power. Skin: No rashes, lesions or ulcers Psychiatry: Judgement and insight appear normal. Mood & affect appropriate.     Data Reviewed: I have personally reviewed following labs and imaging studies  CBC: Recent  Labs  Lab 02/23/19 1414 02/24/19 0834 02/25/19 0408  WBC 12.7* 8.3 6.4  NEUTROABS  --  4.5  --   HGB 14.7 11.8* 10.6*  HCT 42.1 34.5* 31.6*  MCV 81.4 84.4 84.7  PLT 334 250 [id number]   Basic Metabolic Panel: Recent Labs  Lab 02/23/19 1414 02/24/19 0611 02/25/19 0408  NA 135 132* 135  K 3.0* 2.6* 4.0  CL 85* 99 106  CO2 31 25 23   GLUCOSE 111* 89 85  BUN 28* 26* 16  CREATININE 2.70* 1.60* 1.26*  CALCIUM 9.6 7.6* 7.8*  MG 2.1  --  1.8   GFR: Estimated Creatinine Clearance: 48.9 mL/min (A) (by C-G formula based on SCr of 1.26 mg/dL (H)). Liver Function Tests: Recent Labs  Lab 02/23/19 1414 02/24/19 0611  AST 25 16  ALT 11 8  ALKPHOS 79 54  BILITOT 0.7 0.8  PROT 8.7* 6.2*  ALBUMIN 4.7 3.2*   Recent Labs  Lab 02/23/19 1414  02/24/19 0834  LIPASE 44 46   No results for input(s): AMMONIA in the last 168 hours. Coagulation Profile: No results for input(s): INR, PROTIME in the last 168 hours. Cardiac Enzymes: Recent Labs  Lab 02/24/19 0611  CKTOTAL 138   BNP (last 3 results) No results for input(s): PROBNP in the last 8760 hours. HbA1C: No results for input(s): HGBA1C in the last 72 hours. CBG: No results for input(s): GLUCAP in the last 168 hours. Lipid Profile: No results for input(s): CHOL, HDL, LDLCALC, TRIG, CHOLHDL, LDLDIRECT in the last 72 hours. Thyroid Function Tests: Recent Labs    02/23/19 1414 02/23/19 1812  TSH 4.885*  --   FREET4  --  1.45*   Anemia Panel: Recent Labs    02/25/19 0408  VITAMINB12 173*  FOLATE 11.1  FERRITIN 155  TIBC 250  IRON 90   Sepsis Labs: Recent Labs  Lab 02/23/19 2051 02/23/19 2142  LATICACIDVEN 1.0 1.2    Recent Results (from the past 240 hour(s))  SARS CORONAVIRUS 2 (TAT 6-24 HRS) Nasopharyngeal Nasopharyngeal Swab     Status: None   Collection Time: 02/23/19  4:30 PM   Specimen: Nasopharyngeal Swab  Result Value Ref Range Status   SARS Coronavirus 2 NEGATIVE NEGATIVE Final    Comment: (NOTE) SARS-CoV-2 target nucleic acids are NOT DETECTED. The SARS-CoV-2 RNA is generally detectable in upper and lower respiratory specimens during the acute phase of infection. Negative results do not preclude SARS-CoV-2 infection, do not rule out co-infections with other pathogens, and should not be used as the sole basis for treatment or other patient management decisions. Negative results must be combined with clinical observations, patient history, and epidemiological information. The expected result is Negative. Fact Sheet for Patients: SugarRoll.be Fact Sheet for Healthcare Providers: https://www.woods-mathews.com/ This test is not yet approved or cleared by the [place] FDA and  has been authorized  for detection and/or diagnosis of SARS-CoV-2 by FDA under an Emergency Use Authorization (EUA). This EUA will remain  in effect (meaning this test can be used) for the duration of the COVID-19 declaration under Section 56 4(b)(1) of the Act, 21 U.S.C. section 360bbb-3(b)(1), unless the authorization is terminated or revoked sooner. Performed at [facility], [place] [address] [place], [place] [address]          Radiology Studies: Ct Abdomen Pelvis Wo Contrast  Result Date: 02/24/2019 CLINICAL DATA:  Upper abdominal and pelvic pain for several weeks with nausea and vomiting. EXAM: CT ABDOMEN AND PELVIS WITHOUT  CONTRAST TECHNIQUE: Multidetector CT imaging of the abdomen and pelvis was performed following the standard protocol without IV contrast. COMPARISON:  Abdominal ultrasound from 02/23/2019 FINDINGS: Lower chest: Dependent atelectasis in both lower lobes. Hepatobiliary: Unremarkable Pancreas: Equivocal indistinct margins of the pancreas, likely incidental especially in light of the patient's normal lipase level today. Spleen: Unremarkable Adrenals/Urinary Tract: 1.6 cm nonspecific hypodense lesion in the right mid kidney. Enhancement characteristics are on study. No urinary tract calculi are identified. Urinary bladder unremarkable. Stomach/Bowel: The stomach is somewhat distended with contrast medium. Contrast medium makes its way through to the colon. Prominent stool throughout the colon favors constipation. Scattered sigmoid colon diverticula. Normal appendix. Vascular/Lymphatic: Aortoiliac atherosclerotic vascular disease. No pathologic adenopathy is identified. Reproductive: Unremarkable Other: No supplemental non-categorized findings. Musculoskeletal: Unremarkable IMPRESSION: 1. A specific cause for the patient's abdominal pain is not identified. 2. Prominent stool throughout the colon favors constipation. Scattered sigmoid colon diverticula. 3. Aortic atherosclerosis. Aortic  Atherosclerosis (ICD10-I70.0). Electronically Signed   By: Van Clines M.D.   On: 02/24/2019 19:52   US Abdomen Complete  Result Date: 02/23/2019 CLINICAL DATA:  Abdominal pain EXAM: ABDOMEN ULTRASOUND COMPLETE COMPARISON:  None. FINDINGS: Gallbladder: No gallstones or wall thickening visualized. No sonographic Murphy sign noted by sonographer. Common bile duct: Diameter: Normal caliber, 3 mm Liver: 1.6 cm echogenic area within the right hepatic lobe most consistent with hemangioma. Normal echotexture. No biliary ductal dilatation. Portal vein is patent on color Doppler imaging with normal direction of blood flow towards the liver. IVC: No abnormality visualized. Pancreas: Visualized portion unremarkable. Spleen: Size and appearance within normal limits. Right Kidney: Length: 8.9 cm. Echogenicity within normal limits. No mass or hydronephrosis visualized. Left Kidney: Length: 10.3 cm. Echogenicity within normal limits. No mass or hydronephrosis visualized. Abdominal aorta: No aneurysm visualized. Other findings: None. IMPRESSION: No acute findings. 1.6 cm echogenic area within the right hepatic lobe most compatible with hemangioma. Electronically Signed   By: Rolm Baptise M.D.   On: 02/23/2019 21:44        Scheduled Meds: . amLODipine  10 mg Oral Daily  . cyanocobalamin  1,000 mcg Subcutaneous Daily  . feeding supplement  1 Container Oral TID BM  . hydrocerin   Topical BID  . multivitamin with minerals  1 tablet Oral Daily  . nicotine  14 mg Transdermal Daily  . pantoprazole (PROTONIX) IV  40 mg Intravenous Q12H  . polyethylene glycol  17 g Oral Daily  . senna-docusate  1 tablet Oral BID  . sorbitol  30 mL Oral Q2H  . triamcinolone cream   Topical TID   Continuous Infusions: . lactated ringers 75 mL/hr at 02/25/19 1519     LOS: 2 days    Time spent: 35 minutes    Irine Seal, MD [facility]  If 7PM-7AM, please contact night-coverage [email or web address] 02/25/2019,  3:22 PM

## 2019-02-25 NOTE — Progress Notes (Signed)
Progress Note  Patient Name: Keith [name] Date of Encounter: 02/25/2019  Primary Cardiologist: No primary care provider on file.   Subjective   He denies chest pain or shortness of breath.  He reports that at home he only gets chest pain when he is vomiting.  Inpatient Medications    Scheduled Meds:  feeding supplement  1 Container Oral TID BM   hydrocerin   Topical BID   multivitamin with minerals  1 tablet Oral Daily   nicotine  14 mg Transdermal Daily   pantoprazole (PROTONIX) IV  40 mg Intravenous Q12H   triamcinolone cream   Topical TID   Continuous Infusions:  lactated ringers 100 mL/hr at 02/25/19 0316   PRN Meds: albuterol   Vital Signs    Vitals:   02/24/19 1700 02/24/19 2146 02/25/19 0433 02/25/19 0826  BP: (!) 150/83 (!) 152/84 (!) 151/93 (!) 164/98  Pulse: (!) 45 60 (!) 50 (!) 50  Resp:  19 17 18   Temp: (!) 97.4 F (36.3 C) 97.8 F (36.6 C) 97.7 F (36.5 C) 97.7 F (36.5 C)  TempSrc: Oral Oral Oral Oral  SpO2: 99% 100% 100% 99%  Weight:   61.6 kg   Height:        Intake/Output Summary (Last 24 hours) at 02/25/2019 1504 Last data filed at 02/25/2019 1458 Gross per 24 hour  Intake 2476.55 ml  Output 3450 ml  Net -973.45 ml   Last 3 Weights 02/25/2019 02/24/2019 02/23/2019  Weight (lbs) 135 lb 12.8 oz 129 lb 8 oz 129 lb  Weight (kg) 61.598 kg 58.741 kg 58.514 kg      Telemetry    Normal sinus rhythm with no significant arrhythmia- Personally Reviewed  ECG     - Personally Reviewed  Physical Exam   GEN: No acute distress.   Neck: No JVD Cardiac: RRR, no murmurs, rubs, or gallops.  Respiratory: Clear to auscultation bilaterally. GI: Soft, nontender, non-distended  MS: No edema; No deformity. Neuro:  Nonfocal  Psych: Normal affect   Labs    High Sensitivity Troponin:   Recent Labs  Lab 02/23/19 1414 02/23/19 1812 02/23/19 2051  TROPONINIHS 34* 31* 30*      Chemistry Recent Labs  Lab 02/23/19 1414  02/24/19 0611 02/25/19 0408  NA 135 132* 135  K 3.0* 2.6* 4.0  CL 85* 99 106  CO2 31 25 23   GLUCOSE 111* 89 85  BUN 28* 26* 16  CREATININE 2.70* 1.60* 1.26*  CALCIUM 9.6 7.6* 7.8*  PROT 8.7* 6.2*  --   ALBUMIN 4.7 3.2*  --   AST 25 16  --   ALT 11 8  --   ALKPHOS 79 54  --   BILITOT 0.7 0.8  --   GFRNONAA 23* 44* 58*  GFRAA 27* 51* >60  ANIONGAP 19* 8 6     Hematology Recent Labs  Lab 02/23/19 1414 02/24/19 0834 02/25/19 0408  WBC 12.7* 8.3 6.4  RBC 5.17 4.09* 3.73*  HGB 14.7 11.8* 10.6*  HCT 42.1 34.5* 31.6*  MCV 81.4 84.4 84.7  MCH 28.4 28.9 28.4  MCHC 34.9 34.2 33.5  RDW 14.5 14.5 14.6  PLT 334 250 241    BNPNo results for input(s): BNP, PROBNP in the last 168 hours.   DDimer No results for input(s): DDIMER in the last 168 hours.   Radiology    Ct Abdomen Pelvis Wo Contrast  Result Date: 02/24/2019 CLINICAL DATA:  Upper abdominal and pelvic pain for several  weeks with nausea and vomiting. EXAM: CT ABDOMEN AND PELVIS WITHOUT CONTRAST TECHNIQUE: Multidetector CT imaging of the abdomen and pelvis was performed following the standard protocol without IV contrast. COMPARISON:  Abdominal ultrasound from 02/23/2019 FINDINGS: Lower chest: Dependent atelectasis in both lower lobes. Hepatobiliary: Unremarkable Pancreas: Equivocal indistinct margins of the pancreas, likely incidental especially in light of the patient's normal lipase level today. Spleen: Unremarkable Adrenals/Urinary Tract: 1.6 cm nonspecific hypodense lesion in the right mid kidney. Enhancement characteristics are on study. No urinary tract calculi are identified. Urinary bladder unremarkable. Stomach/Bowel: The stomach is somewhat distended with contrast medium. Contrast medium makes its way through to the colon. Prominent stool throughout the colon favors constipation. Scattered sigmoid colon diverticula. Normal appendix. Vascular/Lymphatic: Aortoiliac atherosclerotic vascular disease. No pathologic  adenopathy is identified. Reproductive: Unremarkable Other: No supplemental non-categorized findings. Musculoskeletal: Unremarkable IMPRESSION: 1. A specific cause for the patient's abdominal pain is not identified. 2. Prominent stool throughout the colon favors constipation. Scattered sigmoid colon diverticula. 3. Aortic atherosclerosis. Aortic Atherosclerosis (ICD10-I70.0). Electronically Signed   By: Van Clines M.D.   On: 02/24/2019 19:52   US Abdomen Complete  Result Date: 02/23/2019 CLINICAL DATA:  Abdominal pain EXAM: ABDOMEN ULTRASOUND COMPLETE COMPARISON:  None. FINDINGS: Gallbladder: No gallstones or wall thickening visualized. No sonographic Murphy sign noted by sonographer. Common bile duct: Diameter: Normal caliber, 3 mm Liver: 1.6 cm echogenic area within the right hepatic lobe most consistent with hemangioma. Normal echotexture. No biliary ductal dilatation. Portal vein is patent on color Doppler imaging with normal direction of blood flow towards the liver. IVC: No abnormality visualized. Pancreas: Visualized portion unremarkable. Spleen: Size and appearance within normal limits. Right Kidney: Length: 8.9 cm. Echogenicity within normal limits. No mass or hydronephrosis visualized. Left Kidney: Length: 10.3 cm. Echogenicity within normal limits. No mass or hydronephrosis visualized. Abdominal aorta: No aneurysm visualized. Other findings: None. IMPRESSION: No acute findings. 1.6 cm echogenic area within the right hepatic lobe most compatible with hemangioma. Electronically Signed   By: Rolm Baptise M.D.   On: 02/23/2019 21:44   Dg Abd Portable 2 Views  Result Date: 02/23/2019 CLINICAL DATA:  Abdomen pain, chest pain nausea and vomiting EXAM: PORTABLE ABDOMEN - 2 VIEW COMPARISON:  None. FINDINGS: Visualized lung bases are clear. Questionable lucency at the epigastric region. Nonobstructed bowel-gas pattern. High-riding colon at the right upper quadrant. IMPRESSION: 1. Nonobstructed gas  pattern. 2. Questionable lucency at the epigastric region, consider decubitus view or CT to exclude intraperitoneal air. Electronically Signed   By: Donavan Foil M.D.   On: 02/23/2019 15:20    Cardiac Studies   Echocardiogram was done yesterday:   1. Left ventricular ejection fraction, by visual estimation, is 60 to 65%. The left ventricle has normal function. Left ventricular septal wall thickness was mildly increased. Mildly increased left ventricular posterior wall thickness. There is mildly  increased left ventricular hypertrophy.  2. Global right ventricle has normal systolic function.The right ventricular size is normal. No increase in right ventricular wall thickness.  3. Left atrial size was normal.  4. Right atrial size was mildly dilated.  5. Mild mitral valve prolapse.  6. The mitral valve is grossly normal. Trace mitral valve regurgitation.  7. The tricuspid valve is grossly normal. Tricuspid valve regurgitation is trivial.  8. The aortic valve was not well visualized. Aortic valve regurgitation is not visualized.  9. The pulmonic valve was not well visualized. Pulmonic valve regurgitation is trivial. 10. The atrial septum is grossly normal.  Patient  Profile     68 y.o. male with a hx of PAF not on anticoagulation secondary to gastric outlet obstruction secondary to deep ulcerations, CKD stage II, polysubstance abuse with cocaine use, COPD with ongoing tobacco abuse, syncope felt to be vasovagal versus orthostatic, HTN, and noncompliance who is being seen today for the evaluation of preprocedure cardiac evaluation at the request of Dr. Grandville Silos.  Assessment & Plan    1.  Preop cardiovascular evaluation for EGD: The patient has no anginal symptoms and echo showed normal LV systolic function.  He is at low risk for cardiovascular complications.  2.  History of paroxysmal atrial fibrillation: Currently in sinus rhythm.  He is not on long-term anticoagulation in the setting of  gastric/duodenal ulcers.  3.  Mildly elevated high-sensitivity troponin with no significant delta: Not consistent with acute coronary syndrome.  Likely supply demand ischemia. Recommend outpatient stress test. We will arrange for follow-up in our office.   [facility] will sign off.   Other recommendations (labs, testing, etc): Outpatient Lexiscan Myoview Follow up as an outpatient: We will arrange for follow-up in our office in 2 weeks.  For questions or updates, please contact [place] Please consult [email or web address] for contact info under        Signed, Kathlyn Sacramento, MD  02/25/2019, 3:04 PM

## 2019-02-25 NOTE — Plan of Care (Signed)

## 2019-02-26 ENCOUNTER — Inpatient Hospital Stay: Payer: Medicare HMO | Admitting: Anesthesiology

## 2019-02-26 ENCOUNTER — Encounter
Admission: EM | Discharge status: Against Medical Advice | Payer: Self-pay | Source: Home / Self Care | Attending: Internal Medicine

## 2019-02-26 ENCOUNTER — Encounter: Payer: Self-pay | Admitting: Anesthesiology

## 2019-02-26 DIAGNOSIS — K311 Adult hypertrophic pyloric stenosis: Principal | ICD-10-CM

## 2019-02-26 DIAGNOSIS — K269 Duodenal ulcer, unspecified as acute or chronic, without hemorrhage or perforation: Secondary | ICD-10-CM

## 2019-02-26 DIAGNOSIS — R1110 Vomiting, unspecified: Secondary | ICD-10-CM

## 2019-02-26 DIAGNOSIS — R112 Nausea with vomiting, unspecified: Secondary | ICD-10-CM

## 2019-02-26 HISTORY — PX: ESOPHAGOGASTRODUODENOSCOPY (EGD) WITH PROPOFOL: SHX5813

## 2019-02-26 LAB — CBC
HCT: 33.7 % — ABNORMAL LOW (ref 39.0–52.0)
Hemoglobin: 11.5 g/dL — ABNORMAL LOW (ref 13.0–17.0)
MCH: 28.6 pg (ref 26.0–34.0)
MCHC: 34.1 g/dL (ref 30.0–36.0)
MCV: 83.8 fL (ref 80.0–100.0)
Platelets: 256 10*3/uL (ref 150–400)
RBC: 4.02 MIL/uL — ABNORMAL LOW (ref 4.22–5.81)
RDW: 14.5 % (ref 11.5–15.5)
WBC: 6.3 10*3/uL (ref 4.0–10.5)
nRBC: 0 % (ref 0.0–0.2)

## 2019-02-26 LAB — URINE DRUG SCREEN, QUALITATIVE (ARMC ONLY)
Amphetamines, Ur Screen: NOT DETECTED
Barbiturates, Ur Screen: NOT DETECTED
Benzodiazepine, Ur Scrn: NOT DETECTED
Cannabinoid 50 Ng, Ur ~~LOC~~: NOT DETECTED
Cocaine Metabolite,Ur ~~LOC~~: NOT DETECTED
MDMA (Ecstasy)Ur Screen: NOT DETECTED
Methadone Scn, Ur: NOT DETECTED
Opiate, Ur Screen: NOT DETECTED
Phencyclidine (PCP) Ur S: NOT DETECTED
Tricyclic, Ur Screen: NOT DETECTED

## 2019-02-26 LAB — BASIC METABOLIC PANEL
Anion gap: 5 (ref 5–15)
BUN: 7 mg/dL — ABNORMAL LOW (ref 8–23)
CO2: 24 mmol/L (ref 22–32)
Calcium: 8.1 mg/dL — ABNORMAL LOW (ref 8.9–10.3)
Chloride: 106 mmol/L (ref 98–111)
Creatinine, Ser: 1.16 mg/dL (ref 0.61–1.24)
GFR calc Af Amer: >60 mL/min (ref >60)
GFR calc non Af Amer: >60 mL/min (ref >60)
Glucose, Bld: 86 mg/dL (ref 70–99)
Potassium: 3.9 mmol/L (ref 3.5–5.1)
Sodium: 135 mmol/L (ref 135–145)

## 2019-02-26 LAB — MAGNESIUM: Magnesium: 1.6 mg/dL — ABNORMAL LOW (ref 1.7–2.4)

## 2019-02-26 LAB — OCCULT BLOOD X 1 CARD TO LAB, STOOL: Fecal Occult Bld: NEGATIVE

## 2019-02-26 SURGERY — ESOPHAGOGASTRODUODENOSCOPY (EGD) WITH PROPOFOL
Anesthesia: General

## 2019-02-26 MED ORDER — GLYCOPYRROLATE 0.2 MG/ML IJ SOLN
INTRAMUSCULAR | Status: DC | PRN
  Administered 2019-02-26: 0.2 mg via INTRAVENOUS

## 2019-02-26 MED ORDER — MAGNESIUM SULFATE 4 GM/100ML IV SOLN
4.0000 g | Freq: Once | INTRAVENOUS | Status: AC
  Administered 2019-02-26: 4 g via INTRAVENOUS
  Filled 2019-02-26: qty 100

## 2019-02-26 MED ORDER — PROPOFOL 500 MG/50ML IV EMUL
INTRAVENOUS | Status: DC | PRN
  Administered 2019-02-26: 150 ug/kg/min via INTRAVENOUS

## 2019-02-26 MED ORDER — PHENYLEPHRINE HCL (PRESSORS) 10 MG/ML IV SOLN: INTRAVENOUS | Status: DC | PRN

## 2019-02-26 MED ORDER — PROPOFOL 10 MG/ML IV BOLUS
INTRAVENOUS | Status: DC | PRN
  Administered 2019-02-26: 10 mg via INTRAVENOUS
  Administered 2019-02-26: 60 mg via INTRAVENOUS

## 2019-02-26 MED ORDER — SODIUM CHLORIDE 0.9 % IV SOLN
2.0000 g | Freq: Once | INTRAVENOUS | Status: DC
  Filled 2019-02-26: qty 2

## 2019-02-26 MED ORDER — SODIUM CHLORIDE 0.9 % IV SOLN
INTRAVENOUS | Status: DC | PRN
  Administered 2019-02-26: 1000 mL
  Administered 2019-02-26: 11:00:00 via INTRAVENOUS

## 2019-02-26 MED ORDER — LIDOCAINE HCL (CARDIAC) PF 100 MG/5ML IV SOSY
PREFILLED_SYRINGE | INTRAVENOUS | Status: DC | PRN
  Administered 2019-02-26: 60 mg via INTRATRACHEAL
  Administered 2019-02-26: 40 mg via INTRATRACHEAL

## 2019-02-26 MED ORDER — SODIUM CHLORIDE 0.9 % IV SOLN
2.0000 g | INTRAVENOUS | Status: AC
  Administered 2019-02-27: 2 g via INTRAVENOUS
  Filled 2019-02-26: qty 2

## 2019-02-26 NOTE — Plan of Care (Signed)
  Problem: Health Behavior/Discharge Planning: Goal: Ability to manage health-related needs will improve Outcome: Progressing   Problem: Clinical Measurements: Goal: Ability to maintain clinical measurements within normal limits will improve Outcome: Progressing   Problem: Clinical Measurements: Goal: Will remain free from infection Outcome: Progressing   

## 2019-02-26 NOTE — Anesthesia Preprocedure Evaluation (Signed)
Anesthesia Evaluation  Patient identified by MRN, date of birth, ID band Patient awake    Reviewed: Allergy & Precautions, H&P , NPO status , Patient's Chart, lab work & pertinent test results  History of Anesthesia Complications Negative for: history of anesthetic complications  Airway Mallampati: III  TM Distance: >3 FB Neck ROM: limited    Dental  (+) Chipped, Poor Dentition, Missing   Pulmonary COPD, Current Smoker and Patient abstained from smoking.,           Cardiovascular hypertension, (-) angina+ CAD       Neuro/Psych negative neurological ROS  negative psych ROS   GI/Hepatic Neg liver ROS, PUD,   Endo/Other  negative endocrine ROS  Renal/GU Renal disease  negative genitourinary   Musculoskeletal   Abdominal   Peds  Hematology negative hematology ROS (+)   Anesthesia Other Findings Past Medical History: No date: CKD (chronic kidney disease), stage II No date: Cocaine abuse (HCC) No date: Duodenal ulcer No date: Gastric ulcer No date: Hypertension No date: Noncompliance No date: Paroxysmal atrial fibrillation (HCC) No date: Tobacco abuse  History reviewed. No pertinent surgical history.  BMI    Body Mass Index: 20.74 kg/m      Reproductive/Obstetrics negative OB ROS                             Anesthesia Physical Anesthesia Plan  ASA: IV  Anesthesia Plan: General   Post-op Pain Management:    Induction: Intravenous  PONV Risk Score and Plan: Propofol infusion and TIVA  Airway Management Planned: Natural Airway and Nasal Cannula  Additional Equipment:   Intra-op Plan:   Post-operative Plan:   Informed Consent: I have reviewed the patients History and Physical, chart, labs and discussed the procedure including the risks, benefits and alternatives for the proposed anesthesia with the patient or authorized representative who has indicated his/her  understanding and acceptance.     Dental Advisory Given  Plan Discussed with: Anesthesiologist, CRNA and Surgeon  Anesthesia Plan Comments: (Patient consented for risks of anesthesia including but not limited to:  - adverse reactions to medications - risk of intubation if required - damage to teeth, lips or other oral mucosa - sore throat or hoarseness - Damage to heart, brain, lungs or loss of life  Patient voiced understanding.)        Anesthesia Quick Evaluation

## 2019-02-26 NOTE — Progress Notes (Signed)
PROGRESS NOTE    Keith [name]  [id number] DOB: Mar 15, 1951 DOA: 02/23/2019 PCP: Patient, No Pcp Per   Brief Narrative:  HPI per Dr. Florina Ou Keith [name] is a 68 y.o. male with medical history significant of PUD . Htn. A.fib not on anticoagulation secondary to his gastric and duodenal ulcers, presented with Chest pain and epigastric pain. Pt reported that he has had two episode of similar episode in past. He has tried to make appt with [facility] and cannot get thru and he has also tried to call [facility] for primary care and cant get thru. Pt is noncompliant and has not taken his bp meds.  Patient also reports that he does not use drugs in fact he went to a gathering few days ago" drug party" where he did not use any substances.  He is cutting down his smoking and 1 pack some 3 days.  Per patient he has not had outpatient follow-up as he could not connect with the specialists at [facility]. Chart review shows patient underwent EGD on 08/15/2018 which showed LA grade D esophagitis, non-bleeding gastric ulcer, congested friable mucosa in the pylorus s/p epinephrine and large duodenal ulcer with duodenal stenosis.plan was for pt ot have repeat egd and he did not follow up. Patient has a past medical history of A. fib, substance abuse, CKD.  ED Course:  Patient in the emergency room is alert awake oriented initial vitals shows blood pressure 100/76, heart rate of 40, respirations of 18, temperature of 98.2.  Chest x-ray shows COPD, abdominal x-ray shows nonobstructive gas pattern.  Covid testing is pending.  Assessment & Plan:   Principal Problem:   Acute epigastric pain Active Problems:   Atrial fibrillation (HCC)   Abdominal pain   Left-sided chest pain   Essential hypertension   Tobacco use disorder   Moderate protein-energy malnutrition (HCC)   Acute kidney injury superimposed on CKD (HCC)   Hypokalemia   Troponin level elevated   Dehydration   Acute gastritis without hemorrhage   Vitamin B12  deficiency  1 acute epigastric pain/chest pain Patient presented with complaints of acute epigastric pain radiating into his chest from his abdomen likely GI related.  Patient with history of peptic ulcer disease with gastric and duodenal ulcers, history of grade D esophagitis per EGD May 2020 with nonbleeding superficial gastric ulcer, localized severe mucosal changes characterized by congestion, erythema, narrowing and friability with spontaneous bleeding to the pylorus past pyloric channel status post injection of epinephrine for hemostasis.  Patient noted was to have repeat EGD however this has not been done.  Patient denies any hematemesis or hematochezia or melena.  Patient with ongoing polysubstance use with cocaine.  Chest x-ray with no acute abnormalities however does have changes of COPD with chronic bronchitis, abdominal ultrasound with nonobstructive gas pattern, question lucency at epigastric region consider decubitus view or CT to exclude intraperitoneal air.  CT abdomen and pelvis subsequently obtained with prominent stool throughout the colon favoring constipation.  Scattered sigmoid colon diverticula.  Aortic atherosclerosis.  Continue Protonix 40 mg IV every 12 hours.  H&H stable.  Patient seen by cardiology and cleared for upper endoscopy.  Patient seen by GI and patient for EGD today.  GI following and appreciate input and recommendations.  2.  Paroxysmal atrial fibrillation Rate controlled.  Patient not on anticoagulation secondary to gastric/duodenal ulcers.  Outpatient follow-up with cardiology..  3.  Hypokalemia/hypomagnesemia Repleted.  Potassium of 3.9.  Magnesium at 1.6.  We will give magnesium  sulfate 4 g IV x1.  4.  Mildly elevated high-sensitivity troponin Troponin levels flattened.  Doubt if ACS.  Likely secondary to demand ischemia.  Patient seen by cardiology who are recommending outpatient stress test.  5.  Abnormal thyroid function studies Will need repeat thyroid  function studies done in about 4 to 6 weeks in the outpatient setting and if abnormal will need further evaluation by PCP.  6.  Hypertension Blood pressure elevated today.  IV fluids have been decreased.  Continue home regimen of Norvasc 10 mg daily.  Follow.    7.  Tobacco abuse Tobacco cessation stressed to patient.  Continue nicotine patch.  8.  Acute kidney injury on chronic kidney disease Unclear baseline.  Renal function improving with hydration.  Urinalysis nitrite negative, leukocytes negative, negative for protein.  Urine sodium was 21.  Urine creatinine of 90.  Avoid nephrotoxins.  Decreasedd IV fluids to 75 cc/h.  Follow.   9.  Constipation Patient states that bowel movements yesterday after sorbitol.  Continue current bowel regimen of MiraLAX daily and Senokot-S nightly.   10.  Moderate protein calorie malnutrition  11.  Vitamin B12 deficiency Vitamin B12 levels at 173.  Continue vitamin B12 1000 MCG subcutaneously daily x1 week, and then weekly x1 month, and then monthly.  Will need outpatient follow-up with PCP.     DVT prophylaxis: SCDs Code Status: Full Family Communication: Updated patient.  No family at bedside. Disposition Plan: To be determined   Consultants:   Gastroenterology: Dr. Bonna Gains 02/24/2019  Cardiology: Dr. Curt Bears 02/24/2019  Procedures:   2 D echo pending 02/24/2019  Abdominal ultrasound 02/23/2019  Chest x-ray 02/23/2019  Abdominal films 02/23/2019  CT abdomen and pelvis 02/24/2019  Antimicrobials:   None   Subjective: Patient sleeping however easily arousable.  Denies any further epigastric pain.  States he is very hungry.  No further nausea or emesis.  No shortness of breath.  No chest pain.    Objective: Vitals:   02/25/19 1930 02/26/19 0239 02/26/19 0313 02/26/19 0903  BP: (!) 168/105  (!) 149/99 (!) 149/97  Pulse: (!) 59  (!) 57 (!) 56  Resp: 19  19   Temp: 98 F (36.7 C)  97.7 F (36.5 C) 98.2 F (36.8 C)    TempSrc: Oral  Oral Oral  SpO2: 99%  98% 100%  Weight:  60.1 kg    Height:        Intake/Output Summary (Last 24 hours) at 02/26/2019 0935 Last data filed at 02/26/2019 0700 Gross per 24 hour  Intake 4091.34 ml  Output 4751 ml  Net -659.66 ml   Filed Weights   02/24/19 0513 02/25/19 0433 02/26/19 0239  Weight: 58.7 kg 61.6 kg 60.1 kg    Examination:  General exam: NAD Respiratory system: CTAB.  No wheezes, no crackles, no rhonchi.  Normal respiratory effort.  Speaking in full sentences.  Cardiovascular system: RRR no murmurs rubs or gallops.  No JVD.  No lower extremity edema.  Gastrointestinal system: Abdomen is nontender, nondistended, soft, positive bowel sounds.  No rebound.  No guarding.  Central nervous system: Alert and oriented. No focal neurological deficits. Extremities: Symmetric 5 x 5 power. Skin: No rashes, lesions or ulcers Psychiatry: Judgement and insight appear normal. Mood & affect appropriate.     Data Reviewed: I have personally reviewed following labs and imaging studies  CBC: Recent Labs  Lab 02/23/19 1414 02/24/19 0834 02/25/19 0408 02/26/19 0504  WBC 12.7* 8.3 6.4 6.3  NEUTROABS  --  4.5  --   --   HGB 14.7 11.8* 10.6* 11.5*  HCT 42.1 34.5* 31.6* 33.7*  MCV 81.4 84.4 84.7 83.8  PLT 334 250 241 [id number]   Basic Metabolic Panel: Recent Labs  Lab 02/23/19 1414 02/24/19 0611 02/25/19 0408 02/26/19 0504  NA 135 132* 135 135  K 3.0* 2.6* 4.0 3.9  CL 85* 99 106 106  CO2 31 25 23 24   GLUCOSE 111* 89 85 86  BUN 28* 26* 16 7*  CREATININE 2.70* 1.60* 1.26* 1.16  CALCIUM 9.6 7.6* 7.8* 8.1*  MG 2.1  --  1.8 1.6*   GFR: Estimated Creatinine Clearance: 51.8 mL/min (by C-G formula based on SCr of 1.16 mg/dL). Liver Function Tests: Recent Labs  Lab 02/23/19 1414 02/24/19 0611  AST 25 16  ALT 11 8  ALKPHOS 79 54  BILITOT 0.7 0.8  PROT 8.7* 6.2*  ALBUMIN 4.7 3.2*   Recent Labs  Lab 02/23/19 1414 02/24/19 0834  LIPASE 44 46   No  results for input(s): AMMONIA in the last 168 hours. Coagulation Profile: No results for input(s): INR, PROTIME in the last 168 hours. Cardiac Enzymes: Recent Labs  Lab 02/24/19 0611  CKTOTAL 138   BNP (last 3 results) No results for input(s): PROBNP in the last 8760 hours. HbA1C: No results for input(s): HGBA1C in the last 72 hours. CBG: No results for input(s): GLUCAP in the last 168 hours. Lipid Profile: No results for input(s): CHOL, HDL, LDLCALC, TRIG, CHOLHDL, LDLDIRECT in the last 72 hours. Thyroid Function Tests: Recent Labs    02/23/19 1414 02/23/19 1812  TSH 4.885*  --   FREET4  --  1.45*   Anemia Panel: Recent Labs    02/25/19 0408  VITAMINB12 173*  FOLATE 11.1  FERRITIN 155  TIBC 250  IRON 90   Sepsis Labs: Recent Labs  Lab 02/23/19 2051 02/23/19 2142  LATICACIDVEN 1.0 1.2    Recent Results (from the past 240 hour(s))  SARS CORONAVIRUS 2 (TAT 6-24 HRS) Nasopharyngeal Nasopharyngeal Swab     Status: None   Collection Time: 02/23/19  4:30 PM   Specimen: Nasopharyngeal Swab  Result Value Ref Range Status   SARS Coronavirus 2 NEGATIVE NEGATIVE Final    Comment: (NOTE) SARS-CoV-2 target nucleic acids are NOT DETECTED. The SARS-CoV-2 RNA is generally detectable in upper and lower respiratory specimens during the acute phase of infection. Negative results do not preclude SARS-CoV-2 infection, do not rule out co-infections with other pathogens, and should not be used as the sole basis for treatment or other patient management decisions. Negative results must be combined with clinical observations, patient history, and epidemiological information. The expected result is Negative. Fact Sheet for Patients: SugarRoll.be Fact Sheet for Healthcare Providers: https://www.woods-mathews.com/ This test is not yet approved or cleared by the [place] FDA and  has been authorized for detection and/or diagnosis of  SARS-CoV-2 by FDA under an Emergency Use Authorization (EUA). This EUA will remain  in effect (meaning this test can be used) for the duration of the COVID-19 declaration under Section 56 4(b)(1) of the Act, 21 U.S.C. section 360bbb-3(b)(1), unless the authorization is terminated or revoked sooner. Performed at [facility], [place] [address] [place], [place] [address]   Urine culture     Status: Abnormal   Collection Time: 02/24/19 10:17 AM   Specimen: Urine, Catheterized  Result Value Ref Range Status   Specimen Description   Final    URINE, CATHETERIZED Performed at [facility]  Lab, [address] [place], [place] [address]    Special Requests   Final    NONE Performed at [facility], [address], [place], [place] [address]    Culture (A)  Final    <10,000 COLONIES/mL INSIGNIFICANT GROWTH Performed at [facility] [address], [place],  [address]    Report Status 02/25/2019 FINAL  Final         Radiology Studies: Ct Abdomen Pelvis Wo Contrast  Result Date: 02/24/2019 CLINICAL DATA:  Upper abdominal and pelvic pain for several weeks with nausea and vomiting. EXAM: CT ABDOMEN AND PELVIS WITHOUT CONTRAST TECHNIQUE: Multidetector CT imaging of the abdomen and pelvis was performed following the standard protocol without IV contrast. COMPARISON:  Abdominal ultrasound from 02/23/2019 FINDINGS: Lower chest: Dependent atelectasis in both lower lobes. Hepatobiliary: Unremarkable Pancreas: Equivocal indistinct margins of the pancreas, likely incidental especially in light of the patient's normal lipase level today. Spleen: Unremarkable Adrenals/Urinary Tract: 1.6 cm nonspecific hypodense lesion in the right mid kidney. Enhancement characteristics are on study. No urinary tract calculi are identified. Urinary bladder unremarkable. Stomach/Bowel: The stomach is somewhat distended with contrast medium. Contrast medium makes its way through to the  colon. Prominent stool throughout the colon favors constipation. Scattered sigmoid colon diverticula. Normal appendix. Vascular/Lymphatic: Aortoiliac atherosclerotic vascular disease. No pathologic adenopathy is identified. Reproductive: Unremarkable Other: No supplemental non-categorized findings. Musculoskeletal: Unremarkable IMPRESSION: 1. A specific cause for the patient's abdominal pain is not identified. 2. Prominent stool throughout the colon favors constipation. Scattered sigmoid colon diverticula. 3. Aortic atherosclerosis. Aortic Atherosclerosis (ICD10-I70.0). Electronically Signed   By: Van Clines M.D.   On: 02/24/2019 19:52        Scheduled Meds:  amLODipine  10 mg Oral Daily   cyanocobalamin  1,000 mcg Subcutaneous Daily   feeding supplement  1 Container Oral TID BM   hydrocerin   Topical BID   multivitamin with minerals  1 tablet Oral Daily   nicotine  14 mg Transdermal Daily   pantoprazole (PROTONIX) IV  40 mg Intravenous Q12H   polyethylene glycol  17 g Oral Daily   senna-docusate  1 tablet Oral BID   triamcinolone cream   Topical TID   Continuous Infusions:  lactated ringers 75 mL/hr at 02/26/19 0700   magnesium sulfate bolus IVPB       LOS: 3 days    Time spent: 35 minutes    Irine Seal, MD [facility]  If 7PM-7AM, please contact night-coverage [email or web address] 02/26/2019, 9:35 AM

## 2019-02-26 NOTE — Transfer of Care (Signed)
Immediate Anesthesia Transfer of Care Note  Patient: Keith [name]  Procedure(s) Performed: ESOPHAGOGASTRODUODENOSCOPY (EGD) WITH PROPOFOL (N/A )  Patient Location: Endoscopy Unit  Anesthesia Type:General  Level of Consciousness: drowsy and responds to stimulation  Airway & Oxygen Therapy: Patient Spontanous Breathing and Patient connected to face mask oxygen  Post-op Assessment: Report given to RN and Post -op Vital signs reviewed and stable  Post vital signs: Reviewed and stable  Last Vitals:  Vitals Value Taken Time  BP    Temp    Pulse    Resp    SpO2      Last Pain:  Vitals:   02/26/19 1158  TempSrc: Skin  PainSc: 0-No pain         Complications: No apparent anesthesia complications

## 2019-02-26 NOTE — Consult Note (Signed)
[facility] SURGICAL CONSULTATION NOTE (initial) - [id number]   HISTORY OF PRESENT ILLNESS (HPI):  68 y.o. male presented to [facility] ED 11/14 for evaluation of nausea, emesis, and abdominal pain. At that time, patient was reporting that since September he has been having intermittent nausea and emesis with associated epigastric pain. He reports that he has been able to somewhat tolerate PO but continues to have emesis. He described this as appearing like water. No associated fever, chills, cough, congestion, or SOB/CP above baseline. No history of similar pain in the past. No previous abdominal surgeries. He was admitted to the medicine service for work up and management of PUD, abdominal pain, and NV.   Of note, he has a history of PAF however he has not been taking his anticoagulation secondary to running out and it has been held in the hospital secondary to concern of PUD. He underwent EGD this afternoon with Dr Allen Norris which was concerning for severe pyloric stenosis.   Surgery is consulted by gastroenterology physician Dr. Lucilla Lame, MD in this context for evaluation and management of gastric outlet obstruction secondary to possible adult hypertrophic pyloric stenosis seen on EGD 02/26/19.   PAST MEDICAL HISTORY (PMH):  Past Medical History:  Diagnosis Date  . CKD (chronic kidney disease), stage II   . Cocaine abuse ([facility])   . Duodenal ulcer   . Gastric ulcer   . Hypertension   . Noncompliance   . Paroxysmal atrial fibrillation (HCC)   . Tobacco abuse      PAST SURGICAL HISTORY ([place]):  History reviewed. No pertinent surgical history.   MEDICATIONS:  Prior to Admission medications   Medication Sig Start Date End Date Taking? Authorizing Provider  albuterol (VENTOLIN HFA) 108 (90 Base) MCG/ACT inhaler Inhale 2 puffs into the lungs every 6 (six) hours as needed. 08/16/18 08/16/19 Yes [provider]  amLODipine (NORVASC) 10 MG tablet Take 10 mg by mouth daily.  08/16/18 08/16/19 Yes [provider]     ALLERGIES:  No Known Allergies   SOCIAL HISTORY:  Social History   Socioeconomic History  . Marital status: Single    Spouse name: Not on file  . Number of children: Not on file  . Years of education: Not on file  . Highest education level: Not on file  Occupational History  . Not on file  Social Needs  . Financial resource strain: Not on file  . Food insecurity    Worry: Not on file    Inability: Not on file  . Transportation needs    Medical: Not on file    Non-medical: Not on file  Tobacco Use  . Smoking status: Current Every Day Smoker    Types: Cigarettes  Substance and Sexual Activity  . Alcohol use: Not Currently  . Drug use: Not on file  . Sexual activity: Not on file  Lifestyle  . Physical activity    Days per week: Not on file    Minutes per session: Not on file  . Stress: Not on file  Relationships  . Social Herbalist on phone: Not on file    Gets together: Not on file    Attends religious service: Not on file    Active member of club or organization: Not on file    Attends meetings of clubs or organizations: Not on file    Relationship status: Not on file  . Intimate partner violence    Fear of current or ex  partner: Not on file    Emotionally abused: Not on file    Physically abused: Not on file    Forced sexual activity: Not on file  Other Topics Concern  . Not on file  Social History Narrative  . Not on file     FAMILY HISTORY:  Family History  Problem Relation Age of Onset  . Diabetes Mother   . Hypertension Mother   . CAD Father   . Diabetes Sister   . Hypertension Sister   . Kidney failure Sister       REVIEW OF SYSTEMS:  Review of Systems  Constitutional: Positive for weight loss. Negative for chills and fever.  HENT: Negative for congestion and sore throat.   Respiratory: Negative for cough and shortness of breath.   Cardiovascular: Negative for chest pain and  palpitations.  Gastrointestinal: Positive for abdominal pain, nausea and vomiting. Negative for constipation and diarrhea.  Genitourinary: Negative for dysuria and urgency.  All other systems reviewed and are negative.   VITAL SIGNS:  Temp:  [97.7 F (36.5 C)-98.2 F (36.8 C)] 98 F (36.7 C) (11/17 1334) Pulse Rate:  [54-92] 84 (11/17 1354) Resp:  [15-20] 16 (11/17 1354) BP: (122-168)/(89-105) 140/89 (11/17 1354) SpO2:  [97 %-100 %] 97 % (11/17 1354) Weight:  [60.1 kg] 60.1 kg (11/17 0239)     Height: 5\' 7"  (170.2 cm) Weight: 60.1 kg BMI (Calculated): 20.73   INTAKE/OUTPUT:  11/16 0701 - 11/17 0700 In: 4451.3 [P.O.:2100; I.V.:2351.3] Out: 5641 [Urine:5640; Stool:1]  PHYSICAL EXAM:  Physical Exam Vitals signs and nursing note reviewed.  Constitutional:      General: He is not in acute distress.    Appearance: He is well-developed. He is not ill-appearing.  HENT:     Head: Normocephalic and atraumatic.  Eyes:     General: No scleral icterus.    Extraocular Movements: Extraocular movements intact.     Conjunctiva/sclera: Conjunctivae normal.     Pupils: Pupils are equal, round, and reactive to light.  Cardiovascular:     Rate and Rhythm: Normal rate and regular rhythm.     Pulses: Normal pulses.     Heart sounds: No murmur. No friction rub. No gallop.   Pulmonary:     Effort: Pulmonary effort is normal. No respiratory distress.     Breath sounds: No wheezing or rhonchi.  Abdominal:     General: Abdomen is flat.     Tenderness: There is no abdominal tenderness. There is no guarding or rebound.     Comments: Tympanic in the LUQ, non-tender, non-distended, no rebound/guarding  Genitourinary:    Comments: Deferred Musculoskeletal:     Right lower leg: No edema.     Left lower leg: No edema.  Skin:    General: Skin is warm and dry.     Coloration: Skin is not pale.     Findings: No erythema.  Neurological:     General: No focal deficit present.     Mental Status: He  is alert and oriented to person, place, and time.  Psychiatric:        Mood and Affect: Mood normal.        Behavior: Behavior normal.      Labs:  CBC Latest Ref Rng & Units 02/26/2019 02/25/2019 02/24/2019  WBC 4.0 - 10.5 K/uL 6.3 6.4 8.3  Hemoglobin 13.0 - 17.0 g/dL 11.5(L) 10.6(L) 11.8(L)  Hematocrit 39.0 - 52.0 % 33.7(L) 31.6(L) 34.5(L)  Platelets 150 - 400 K/uL 256 241 250  CMP Latest Ref Rng & Units 02/26/2019 02/25/2019 02/24/2019  Glucose 70 - 99 mg/dL 86 85 89  BUN 8 - 23 mg/dL 7(L) 16 26(H)  Creatinine 0.61 - 1.24 mg/dL 1.16 1.26(H) 1.60(H)  Sodium 135 - 145 mmol/L 135 135 132(L)  Potassium 3.5 - 5.1 mmol/L 3.9 4.0 2.6(LL)  Chloride 98 - 111 mmol/L 106 106 99  CO2 22 - 32 mmol/L 24 23 25   Calcium 8.9 - 10.3 mg/dL 8.1(L) 7.8(L) 7.6(L)  Total Protein 6.5 - 8.1 g/dL - - 6.2(L)  Total Bilirubin 0.3 - 1.2 mg/dL - - 0.8  Alkaline Phos 38 - 126 U/L - - 54  AST 15 - 41 U/L - - 16  ALT 0 - 44 U/L - - 8    Procedures Reviewed:  Endoscopy (and images) on 02/26/2019- Dr Allen Norris, MD Impression:             - LA Grade D reflux esophagitis with no bleeding. - Gastric stenosis was found at the pylorus. Dilated. - Non-bleeding duodenal ulcer with no stigmata of bleeding. - No specimens collected.   Imaging studies:   CT Abdomen/Pelvis (02/24/2019) personally reviewed and radiologist report reviewed:  IMPRESSION: 1. A specific cause for the patient's abdominal pain is not identified. 2. Prominent stool throughout the colon favors constipation. Scattered sigmoid colon diverticula. 3. Aortic atherosclerosis.   Assessment/Plan: (ICD-10's: K31.1) 68 y.o. male with gastric outlet obstruction, complicated by pertinent comorbidities including a history of PAF who has been off anticoagulation for prolonged period of time.   - Okay for full liquid diet today; NPO after midnight  - Will plan on gastrojejunostomy tomorrow morning with Dr Dahlia Byes pending OR/Anesthesia availability   -  All risks, benefits, and alternatives to the above procedure(s) were discussed with the patient, all of his questions were answered to his expressed satisfaction, patient expresses he wishes to proceed, and informed consent was obtained.  - DVT prophylaxis: hold for OR tomorrow - continue to hold anticoagulation  All of the above findings and recommendations were discussed with the patient, and all of patient's questions were answered to his expressed satisfaction.  Thank you for the opportunity to participate in this patient's care.   -- Edison Simon, PA-C [facility] 02/26/2019, 2:07 PM [phone] M-F: 7am - 4pm

## 2019-02-26 NOTE — Anesthesia Post-op Follow-up Note (Signed)
Anesthesia QCDR form completed.        

## 2019-02-26 NOTE — Care Management Important Message (Signed)
Important Message  Patient Details  Name: ORLANDA MANUEL MRN: [id number] Date of Birth: 11-05-1950   Medicare Important Message Given:  Yes     Dannette Barbara 02/26/2019, 11:31 AM

## 2019-02-26 NOTE — Op Note (Addendum)
[facility] Gastroenterology Patient Name: Keith [name] Procedure Date: 02/26/2019 1:02 PM MRN: [id number] Account #: [id number] Date of Birth: 1950/08/12 Admit Type: Outpatient Age: 68 Room: [facility] ENDO ROOM 4 Gender: Male Note Status: Finalized Procedure:             Upper GI endoscopy Indications:           Nausea with vomiting Providers:             Lucilla Lame MD, MD Medicines:             Propofol per Anesthesia Complications:         No immediate complications. Procedure:             Pre-Anesthesia Assessment:                        - Prior to the procedure, a History and Physical was                         performed, and patient medications and allergies were                         reviewed. The patient's tolerance of previous                         anesthesia was also reviewed. The risks and benefits                         of the procedure and the sedation options and risks                         were discussed with the patient. All questions were                         answered, and informed consent was obtained. Prior                         Anticoagulants: The patient has taken no previous                         anticoagulant or antiplatelet agents. ASA Grade                         Assessment: II - A patient with mild systemic disease.                         After reviewing the risks and benefits, the patient                         was deemed in satisfactory condition to undergo the                         procedure.                        After obtaining informed consent, the endoscope was                         passed under direct vision. Throughout the procedure,  the patient's blood pressure, pulse, and oxygen                         saturations were monitored continuously. The Endoscope                         was introduced through the mouth, and advanced to the                         duodenal bulb. The upper GI  endoscopy was accomplished                         without difficulty. The patient tolerated the                         procedure well. Findings:      LA Grade D (one or more mucosal breaks involving at least 75% of       esophageal circumference) esophagitis with no bleeding was found in the       lower third of the esophagus.      A benign-appearing, intrinsic severe stenosis was found at the pylorus.       This was traversed. A TTS dilator was passed through the scope. Dilation       with a 12 mm pyloric balloon dilator was performed. The dilation site       was examined following endoscope reinsertion and showed mild mucosal       disruption.      One non-bleeding cratered duodenal ulcer with no stigmata of bleeding       was found in the duodenal bulb. Impression:            - LA Grade D reflux esophagitis with no bleeding.                        - Gastric stenosis was found at the pylorus. Dilated.                        - Non-bleeding duodenal ulcer with no stigmata of                         bleeding.                        - No specimens collected. Recommendation:        - Return patient to hospital ward for ongoing care.                        - Resume previous diet.                        - Refer to a Psychologist, sport and exercise. Procedure Code(s):     --- Professional ---                        [phone], Esophagogastroduodenoscopy, flexible,                         transoral; with dilation of gastric/duodenal  stricture(s) (eg, balloon, bougie) Diagnosis Code(s):     --- Professional ---                        R11.2, Nausea with vomiting, unspecified                        K26.9, Duodenal ulcer, unspecified as acute or                         chronic, without hemorrhage or perforation                        K31.1, Adult hypertrophic pyloric stenosis CPT copyright 2019 American Medical Association. All rights reserved. The codes documented in this report are preliminary  and upon coder review may  be revised to meet current compliance requirements. Lucilla Lame MD, MD 02/26/2019 1:31:07 PM This report has been signed electronically. Number of Addenda: 0 Note Initiated On: 02/26/2019 1:02 PM Estimated Blood Loss:  Estimated blood loss: none.      [facility]

## 2019-02-26 NOTE — Progress Notes (Signed)
Nutrition Follow Up Note   DOCUMENTATION CODES:   Non-severe (moderate) malnutrition in context of social or environmental circumstances  INTERVENTION:   Boost Breeze po TID, each supplement provides 250 kcal and 9 grams of protein  Ensure Enlive po TID once diet advanced, each supplement provides 350 kcal and 20 grams of protein  Provide daily MVI  Pt at refeed risk; recommend monitor K, Mg and P labs daily  NUTRITION DIAGNOSIS:   Moderate Malnutrition related to social / environmental circumstances(polysubstance abuse) as evidenced by moderate fat depletion, moderate muscle depletion, severe muscle depletion.  GOAL:   Patient will meet greater than or equal to 90% of their needs  MONITOR:   PO intake, Supplement acceptance, Diet advancement, Labs, Weight trends, I & O's  REASON FOR ASSESSMENT:   Malnutrition Screening Tool, Consult Assessment of nutrition requirement/status  ASSESSMENT:   68 year old male with PMHx of paroxysmal A-fib, HTN, polysubstance abuse (cocaine positive), hx of gastric and duodenal ulcers admitted with epigastric pain likely from underlying ulcers.   Pt s/p EGD today, found to have LA Grade D reflux esophagitis with no bleeding, gastric stenosis was found at the pylorus which was dilated, non-bleeding duodenal ulcer  Pt continues to be on NPO/clear liquid diet since admit. Pt NPO today for EGD. Pt drinking Boost Breeze; will change to Ensure once diet advances. Pt is at high refeed risk; recommend monitor K, Mg and P labs daily until stable. Per chart, pt with ~3lb weight gain since admit.   Medications reviewed and include: B12, MVI, nicotine, protonix, miralax, senokot, LRS @75ml /hr  Labs reviewed: K 3.9 wnl, Mg 1.6(L) B12 173(L)- 11/16  Diet Order:   Diet Order            Diet NPO time specified Except for: Sips with Meds  Diet effective midnight             EDUCATION NEEDS:   No education needs have been identified at this  time  Skin:  Skin Assessment: Reviewed RN Assessment  Last BM:  11/16- type 7  Height:   Ht Readings from Last 1 Encounters:  02/23/19 5\' 7"  (1.702 m)   Weight:   Wt Readings from Last 1 Encounters:  02/26/19 60.1 kg   Ideal Body Weight:  67.3 kg  BMI:  Body mass index is 20.74 kg/m.  Estimated Nutritional Needs:   Kcal:  1600-1800  Protein:  80-90 grams  Fluid:  1.6-1.8 L/day  Koleen Distance MS, RD, LDN Pager #- [phone] Office#- [phone] After Hours Pager: [phone]

## 2019-02-26 NOTE — Anesthesia Postprocedure Evaluation (Signed)
Anesthesia Post Note  Patient: Keith [name]  Procedure(s) Performed: ESOPHAGOGASTRODUODENOSCOPY (EGD) WITH PROPOFOL (N/A )  Patient location during evaluation: Endoscopy Anesthesia Type: General Level of consciousness: awake and alert and oriented Pain management: pain level controlled Vital Signs Assessment: post-procedure vital signs reviewed and stable Respiratory status: spontaneous breathing, nonlabored ventilation and respiratory function stable Cardiovascular status: blood pressure returned to baseline and stable Postop Assessment: no signs of nausea or vomiting Anesthetic complications: no     Last Vitals:  Vitals:   02/26/19 1344 02/26/19 1354  BP: (!) 122/98 140/89  Pulse: 84 84  Resp: 15 16  Temp:    SpO2: 100% 97%    Last Pain:  Vitals:   02/26/19 1354  TempSrc:   PainSc: 0-No pain                 [name]

## 2019-02-27 ENCOUNTER — Inpatient Hospital Stay: Payer: Medicare HMO | Admitting: Anesthesiology

## 2019-02-27 ENCOUNTER — Encounter
Admission: EM | Discharge status: Against Medical Advice | Payer: Self-pay | Source: Home / Self Care | Attending: Internal Medicine

## 2019-02-27 DIAGNOSIS — K269 Duodenal ulcer, unspecified as acute or chronic, without hemorrhage or perforation: Secondary | ICD-10-CM

## 2019-02-27 HISTORY — PX: GASTROJEJUNOSTOMY: SHX1697

## 2019-02-27 LAB — CBC
HCT: 34.7 % — ABNORMAL LOW (ref 39.0–52.0)
Hemoglobin: 12.3 g/dL — ABNORMAL LOW (ref 13.0–17.0)
MCH: 28.7 pg (ref 26.0–34.0)
MCHC: 35.4 g/dL (ref 30.0–36.0)
MCV: 80.9 fL (ref 80.0–100.0)
Platelets: 273 10*3/uL (ref 150–400)
RBC: 4.29 MIL/uL (ref 4.22–5.81)
RDW: 14.9 % (ref 11.5–15.5)
WBC: 10.7 10*3/uL — ABNORMAL HIGH (ref 4.0–10.5)
nRBC: 0 % (ref 0.0–0.2)

## 2019-02-27 LAB — MAGNESIUM: Magnesium: 2.1 mg/dL (ref 1.7–2.4)

## 2019-02-27 LAB — BASIC METABOLIC PANEL
Anion gap: 8 (ref 5–15)
BUN: 7 mg/dL — ABNORMAL LOW (ref 8–23)
CO2: 22 mmol/L (ref 22–32)
Calcium: 8.6 mg/dL — ABNORMAL LOW (ref 8.9–10.3)
Chloride: 105 mmol/L (ref 98–111)
Creatinine, Ser: 1.18 mg/dL (ref 0.61–1.24)
GFR calc Af Amer: >60 mL/min (ref >60)
GFR calc non Af Amer: >60 mL/min (ref >60)
Glucose, Bld: 77 mg/dL (ref 70–99)
Potassium: 4.2 mmol/L (ref 3.5–5.1)
Sodium: 135 mmol/L (ref 135–145)

## 2019-02-27 LAB — PHOSPHORUS: Phosphorus: 3.7 mg/dL (ref 2.5–4.6)

## 2019-02-27 SURGERY — GASTROJEJUNOSTOMY
Anesthesia: General

## 2019-02-27 MED ORDER — MEPERIDINE HCL 50 MG/ML IJ SOLN: 6.2500 mg | INTRAMUSCULAR | Status: DC | PRN

## 2019-02-27 MED ORDER — SODIUM CHLORIDE 0.9 % IV SOLN
2.0000 g | INTRAVENOUS | Status: AC
  Filled 2019-02-27: qty 2

## 2019-02-27 MED ORDER — FENTANYL CITRATE (PF) 100 MCG/2ML IJ SOLN
INTRAMUSCULAR | Status: AC
  Administered 2019-02-27: 12:00:00 25 ug via INTRAVENOUS
  Filled 2019-02-27: qty 2

## 2019-02-27 MED ORDER — HYDROMORPHONE HCL 1 MG/ML IJ SOLN
INTRAMUSCULAR | Status: AC
  Administered 2019-02-27: 0.5 mg via INTRAVENOUS
  Filled 2019-02-27: qty 1

## 2019-02-27 MED ORDER — AMLODIPINE BESYLATE 10 MG PO TABS
10.0000 mg | ORAL_TABLET | Freq: Every day | ORAL | Status: DC
  Administered 2019-02-28 – 2019-03-06 (×7): 10 mg via ORAL
  Filled 2019-02-27 (×7): qty 1

## 2019-02-27 MED ORDER — PROMETHAZINE HCL 25 MG/ML IJ SOLN: 6.2500 mg | INTRAMUSCULAR | Status: DC | PRN

## 2019-02-27 MED ORDER — LABETALOL HCL 5 MG/ML IV SOLN
INTRAVENOUS | Status: DC | PRN
  Administered 2019-02-27 (×3): 5 mg via INTRAVENOUS

## 2019-02-27 MED ORDER — BUPIVACAINE LIPOSOME 1.3 % IJ SUSP
INTRAMUSCULAR | Status: AC
  Filled 2019-02-27: qty 20

## 2019-02-27 MED ORDER — PROPOFOL 10 MG/ML IV BOLUS
INTRAVENOUS | Status: DC | PRN
  Administered 2019-02-27: 100 mg via INTRAVENOUS

## 2019-02-27 MED ORDER — FENTANYL CITRATE (PF) 100 MCG/2ML IJ SOLN
25.0000 ug | INTRAMUSCULAR | Status: AC | PRN
  Administered 2019-02-27 (×6): 25 ug via INTRAVENOUS

## 2019-02-27 MED ORDER — HYDROMORPHONE HCL 1 MG/ML IJ SOLN
0.5000 mg | INTRAMUSCULAR | Status: DC | PRN
  Administered 2019-02-27: 13:00:00 0.5 mg via INTRAVENOUS

## 2019-02-27 MED ORDER — SUGAMMADEX SODIUM 200 MG/2ML IV SOLN
INTRAVENOUS | Status: DC | PRN
  Administered 2019-02-27: 120 mg via INTRAVENOUS

## 2019-02-27 MED ORDER — ACETAMINOPHEN 10 MG/ML IV SOLN
1000.0000 mg | Freq: Four times a day (QID) | INTRAVENOUS | Status: AC
  Administered 2019-02-27 – 2019-02-28 (×4): 1000 mg via INTRAVENOUS
  Filled 2019-02-27 (×4): qty 100

## 2019-02-27 MED ORDER — FENTANYL CITRATE (PF) 100 MCG/2ML IJ SOLN
INTRAMUSCULAR | Status: DC | PRN
  Administered 2019-02-27 (×2): 50 ug via INTRAVENOUS
  Administered 2019-02-27: 25 ug via INTRAVENOUS
  Administered 2019-02-27: 50 ug via INTRAVENOUS
  Administered 2019-02-27: 25 ug via INTRAVENOUS

## 2019-02-27 MED ORDER — SODIUM CHLORIDE 0.9 % IV SOLN
INTRAVENOUS | Status: DC | PRN
  Administered 2019-02-27: 70 mL

## 2019-02-27 MED ORDER — FENTANYL CITRATE (PF) 100 MCG/2ML IJ SOLN
INTRAMUSCULAR | Status: AC
  Administered 2019-02-27: 25 ug via INTRAVENOUS
  Filled 2019-02-27: qty 2

## 2019-02-27 MED ORDER — OSMOLITE 1.5 CAL PO LIQD
1000.0000 mL | ORAL | Status: DC
  Administered 2019-02-27: 1000 mL

## 2019-02-27 MED ORDER — FREE WATER
30.0000 mL | Status: DC
  Administered 2019-02-27 – 2019-03-02 (×16): 30 mL

## 2019-02-27 MED ORDER — SODIUM CHLORIDE (PF) 0.9 % IJ SOLN
INTRAMUSCULAR | Status: AC
  Filled 2019-02-27: qty 50

## 2019-02-27 MED ORDER — GLYCOPYRROLATE 0.2 MG/ML IJ SOLN
INTRAMUSCULAR | Status: DC | PRN
  Administered 2019-02-27: 0.2 mg via INTRAVENOUS

## 2019-02-27 MED ORDER — HYDROMORPHONE HCL 1 MG/ML IJ SOLN
INTRAMUSCULAR | Status: DC | PRN
  Administered 2019-02-27 (×2): 0.5 mg via INTRAVENOUS

## 2019-02-27 MED ORDER — MIDAZOLAM HCL 2 MG/2ML IJ SOLN
INTRAMUSCULAR | Status: DC | PRN
  Administered 2019-02-27: 2 mg via INTRAVENOUS

## 2019-02-27 MED ORDER — ONDANSETRON HCL 4 MG/2ML IJ SOLN
INTRAMUSCULAR | Status: DC | PRN
  Administered 2019-02-27: 4 mg via INTRAVENOUS

## 2019-02-27 MED ORDER — MIDAZOLAM HCL 2 MG/2ML IJ SOLN
INTRAMUSCULAR | Status: AC
  Filled 2019-02-27: qty 2

## 2019-02-27 MED ORDER — CHLORHEXIDINE GLUCONATE CLOTH 2 % EX PADS
6.0000 | MEDICATED_PAD | Freq: Every day | CUTANEOUS | Status: DC
  Administered 2019-02-28 – 2019-03-05 (×5): 6 via TOPICAL

## 2019-02-27 MED ORDER — LIDOCAINE HCL (CARDIAC) PF 100 MG/5ML IV SOSY
PREFILLED_SYRINGE | INTRAVENOUS | Status: DC | PRN
  Administered 2019-02-27: 30 mg via INTRAVENOUS

## 2019-02-27 MED ORDER — SUCCINYLCHOLINE CHLORIDE 20 MG/ML IJ SOLN
INTRAMUSCULAR | Status: DC | PRN
  Administered 2019-02-27: 120 mg via INTRAVENOUS

## 2019-02-27 MED ORDER — OXYCODONE HCL 5 MG/5ML PO SOLN: 5.0000 mg | Freq: Once | ORAL | Status: DC | PRN

## 2019-02-27 MED ORDER — OXYCODONE HCL 20 MG/ML PO CONC
10.0000 mg | ORAL | Status: DC | PRN
  Filled 2019-02-27: qty 1

## 2019-02-27 MED ORDER — ESMOLOL HCL 100 MG/10ML IV SOLN
INTRAVENOUS | Status: DC | PRN
  Administered 2019-02-27 (×2): 20 mg via INTRAVENOUS

## 2019-02-27 MED ORDER — PRO-STAT SUGAR FREE PO LIQD
30.0000 mL | Freq: Every day | ORAL | Status: DC
  Administered 2019-02-28 – 2019-03-02 (×2): 30 mL

## 2019-02-27 MED ORDER — HYDROMORPHONE HCL 1 MG/ML IJ SOLN
INTRAMUSCULAR | Status: AC
  Filled 2019-02-27: qty 1

## 2019-02-27 MED ORDER — BUPIVACAINE HCL (PF) 0.25 % IJ SOLN
INTRAMUSCULAR | Status: AC
  Filled 2019-02-27: qty 30

## 2019-02-27 MED ORDER — FENTANYL CITRATE (PF) 100 MCG/2ML IJ SOLN
INTRAMUSCULAR | Status: AC
  Filled 2019-02-27: qty 2

## 2019-02-27 MED ORDER — HYDROMORPHONE HCL 1 MG/ML IJ SOLN: 0.5000 mg | INTRAMUSCULAR | Status: DC | PRN

## 2019-02-27 MED ORDER — BUPIVACAINE-EPINEPHRINE (PF) 0.25% -1:200000 IJ SOLN
INTRAMUSCULAR | Status: DC | PRN
  Administered 2019-02-27: 30 mL

## 2019-02-27 MED ORDER — PROPOFOL 500 MG/50ML IV EMUL
INTRAVENOUS | Status: AC
  Filled 2019-02-27: qty 50

## 2019-02-27 MED ORDER — EPINEPHRINE PF 1 MG/ML IJ SOLN
INTRAMUSCULAR | Status: AC
  Filled 2019-02-27: qty 1

## 2019-02-27 MED ORDER — OXYCODONE HCL 5 MG PO TABS: 5.0000 mg | ORAL_TABLET | Freq: Once | ORAL | Status: DC | PRN

## 2019-02-27 MED ORDER — MORPHINE SULFATE (PF) 4 MG/ML IV SOLN
4.0000 mg | INTRAVENOUS | Status: DC | PRN
  Administered 2019-02-27 – 2019-03-06 (×37): 4 mg via INTRAVENOUS
  Filled 2019-02-27 (×38): qty 1

## 2019-02-27 MED ORDER — HYDRALAZINE HCL 20 MG/ML IJ SOLN
INTRAMUSCULAR | Status: DC | PRN
  Administered 2019-02-27 (×3): 4 mg via INTRAVENOUS

## 2019-02-27 MED ORDER — ROCURONIUM BROMIDE 100 MG/10ML IV SOLN
INTRAVENOUS | Status: DC | PRN
  Administered 2019-02-27 (×3): 10 mg via INTRAVENOUS
  Administered 2019-02-27: 30 mg via INTRAVENOUS
  Administered 2019-02-27: 10 mg via INTRAVENOUS

## 2019-02-27 SURGICAL SUPPLY — 68 items
APL PRP STRL LF DISP 70% ISPRP (MISCELLANEOUS) ×1
APPLIER CLIP 11 MED OPEN (CLIP)
APPLIER CLIP 13 LRG OPEN (CLIP)
APR CLP LRG 13 20 CLIP (CLIP)
APR CLP MED 11 20 MLT OPN (CLIP)
BARRIER ADH SEPRAFILM 3INX5IN (MISCELLANEOUS) IMPLANT
BLADE CLIPPER SURG (BLADE) ×3 IMPLANT
BLADE SURG SZ10 CARB STEEL (BLADE) ×3 IMPLANT
BRR ADH 5X3 SEPRAFILM 2 SHT (MISCELLANEOUS)
BULB RESERV EVAC DRAIN JP 100C (MISCELLANEOUS) ×2 IMPLANT
CANISTER SUCT 3000ML PPV (MISCELLANEOUS) ×3 IMPLANT
CATH ROBINSON RED A/P 16FR (CATHETERS) ×2 IMPLANT
CATH ROBINSON RED A/P 20FR (CATHETERS) ×2 IMPLANT
CHLORAPREP W/TINT 26 (MISCELLANEOUS) ×3 IMPLANT
CLIP APPLIE 11 MED OPEN (CLIP) ×1 IMPLANT
CLIP APPLIE 13 LRG OPEN (CLIP) ×1 IMPLANT
COVER BACK TABLE REUSABLE LG (DRAPES) ×1 IMPLANT
COVER WAND RF STERILE (DRAPES) ×3 IMPLANT
DRAIN CHANNEL JP 19F (MISCELLANEOUS) ×2 IMPLANT
DRAPE LAPAROTOMY 100X77 ABD (DRAPES) ×3 IMPLANT
DRSG OPSITE POSTOP 4X10 (GAUZE/BANDAGES/DRESSINGS) ×2 IMPLANT
DRSG TEGADERM 2-3/8X2-3/4 SM (GAUZE/BANDAGES/DRESSINGS) ×2 IMPLANT
DRSG TELFA 3X8 NADH (GAUZE/BANDAGES/DRESSINGS) IMPLANT
ELECT BLADE 6.5 EXT (BLADE) ×3 IMPLANT
ELECT REM PT RETURN 9FT ADLT (ELECTROSURGICAL) ×3
ELECTRODE REM PT RTRN 9FT ADLT (ELECTROSURGICAL) ×1 IMPLANT
GAUZE SPONGE 4X4 12PLY STRL (GAUZE/BANDAGES/DRESSINGS) ×2 IMPLANT
GLOVE BIO SURGEON STRL SZ7 (GLOVE) ×7 IMPLANT
GOWN STRL REUS W/ TWL LRG LVL3 (GOWN DISPOSABLE) ×2 IMPLANT
GOWN STRL REUS W/TWL LRG LVL3 (GOWN DISPOSABLE) ×12
HANDLE SUCTION POOLE (INSTRUMENTS) ×1 IMPLANT
HANDLE YANKAUER SUCT BULB TIP (MISCELLANEOUS) ×3 IMPLANT
LIGASURE IMPACT 36 18CM CVD LR (INSTRUMENTS) ×2 IMPLANT
NDL HYPO 25X1 1.5 SAFETY (NEEDLE) ×1 IMPLANT
NEEDLE HYPO 22GX1.5 SAFETY (NEEDLE) ×4 IMPLANT
NEEDLE HYPO 25X1 1.5 SAFETY (NEEDLE) ×3 IMPLANT
PACK BASIN MAJOR ARMC (MISCELLANEOUS) ×3 IMPLANT
PAD DRESSING TELFA 3X8 NADH (GAUZE/BANDAGES/DRESSINGS) ×1 IMPLANT
PLUG CATH AND CAP STER (CATHETERS) ×2 IMPLANT
RELOAD PROXIMATE 75MM BLUE (ENDOMECHANICALS) IMPLANT
RELOAD STAPLE 60 3.6 BLU REG (STAPLE) IMPLANT
RELOAD STAPLE 60 4.1 GRN THCK (STAPLE) IMPLANT
RELOAD STAPLE 75 3.8 BLU REG (ENDOMECHANICALS) IMPLANT
RELOAD STAPLER BLUE 60MM (STAPLE) ×2 IMPLANT
RELOAD STAPLER GREEN 60MM (STAPLE) ×3 IMPLANT
SPONGE KITTNER 5P (MISCELLANEOUS) ×6 IMPLANT
SPONGE LAP 18X18 RF (DISPOSABLE) ×7 IMPLANT
SPONGE LAP 18X36 RFD (DISPOSABLE) ×2 IMPLANT
STAPLE ECHEON FLEX 60 POW ENDO (STAPLE) ×2 IMPLANT
STAPLER PROX 25M (MISCELLANEOUS) ×2 IMPLANT
STAPLER PROXIMATE 75MM BLUE (STAPLE) IMPLANT
STAPLER RELOAD BLUE 60MM (STAPLE) ×6
STAPLER RELOAD GREEN 60MM (STAPLE) ×9
STAPLER SKIN PROX 35W (STAPLE) ×3 IMPLANT
SUCTION POOLE HANDLE (INSTRUMENTS) ×3
SUT PDS AB 0 CT1 27 (SUTURE) ×7 IMPLANT
SUT PROLENE 0 CT 2 (SUTURE) ×2 IMPLANT
SUT SILK 2 0 (SUTURE) ×3
SUT SILK 2 0 SH CR/8 (SUTURE) ×3 IMPLANT
SUT SILK 2 0SH CR/8 30 (SUTURE) ×3 IMPLANT
SUT SILK 2-0 18XBRD TIE 12 (SUTURE) ×1 IMPLANT
SUT VIC AB 0 CT1 36 (SUTURE) ×6 IMPLANT
SUT VIC AB 2-0 SH 27 (SUTURE) ×6
SUT VIC AB 2-0 SH 27XBRD (SUTURE) ×2 IMPLANT
SYR 30ML LL (SYRINGE) ×6 IMPLANT
SYR 3ML LL SCALE MARK (SYRINGE) ×3 IMPLANT
TAPE MICROFOAM 4IN (TAPE) ×1 IMPLANT
TRAY FOLEY MTR SLVR 16FR STAT (SET/KITS/TRAYS/PACK) ×3 IMPLANT

## 2019-02-27 NOTE — Op Note (Signed)
PROCEDURES: 1. Antrectomy ( partial Gastrectomy) with Billroth I Reconstruction  2. Highly selective vagotomy 3. Feeding jejunostomy 4. Placement 19 Blake drain  Pre-operative Diagnosis: 1.  Recalcitrant pyloric stenosis from peptic ulcer disease. Endoscopic dilation failure.  Patient noncompliant.  Post-operative Diagnosis: Same  Surgeon: Jules Husbands   Assistants: Dr. Genevive Bi required due to the complexity in the case and for the anastomosis.  Anesthesia: General endotracheal anesthesia  ASA Class: 3  Surgeon: Caroleen Hamman , MD FACS  Anesthesia: Gen. with endotracheal tube  Findings: First portion duodenal stricture with a contained perforation. No evidence of intraoperative anastomotic leak. Widely Patent Anastomosis with good perfusion and tension free  Estimated Blood Loss: 25cc         Drains: #19 [place] Blake         Specimens: Stomach       Complications: None             Condition: Stable  Procedure Details  The patient was seen again in the Holding Room. The benefits, complications, treatment options, and expected outcomes were discussed with the patient. The risks of bleeding, infection, recurrence of symptoms, failure to resolve symptoms,  bowel injury, any of which could require further surgery were reviewed with the patient.   The patient was taken to Operating Room, identified as Keith [name] and the procedure verified.  A Time Out was held and the above information confirmed.  Prior to the induction of general anesthesia, antibiotic prophylaxis was administered. VTE prophylaxis was in place. General endotracheal anesthesia was then administered and tolerated well. After the induction, the abdomen was prepped with Chloraprep and draped in the sterile fashion. The patient was positioned in the supine position.  Upper midline laparotomy was performed with a 10 blade knife.  Electrocautery was used to dissect through subcutaneous tissue and the peritoneum was  elevated between 2 hemostats and incised with Metzenbaum scissors.  Incision was extended and inspection revealed evidence of duodenal stricture within the first portion.  There was no evidence of any succus or free perforation. With started operation with a generous Kocher maneuver to be able to mobilize the first and second portion of the duodenum.  Please also note that there was significant inflammatory response around the first and second portion of the duodenum from the stricture and the perforation.  Meticulous dissection was carried to avoid any injuries to the hepatic artery or the common bile duct.  Were finally able to mobilize the first and the second portion of the duodenum visualize a contained perforation right at the stricture site.  Attention then was turned to the lesser sac where using the LigaSure device we were able to do a supra selective vagotomy preserving the hepatic branches off the vagus nerve. Attention then was turned to the greater curvature and preserving the gastroepiploic artery were able to divide the short gastrics.  We selected our transection point proximal to the incisura angularis. Is noted that the stomach good perfusion. Given the favorable conditions I decided to perform a Billroth 1 reconstruction.  An antrectomy was performed using multiple green loads of the  Echelon stapler. Attention was then turned to the first and second portion of the duodenum where we had a good circumferential dissection.  Given that the patient had already a perforation we were able to place the 25 mm anvil through the previous defect.  We were able to position the anvil to the third portion of the duodenum and transected between the first and  second portion of the duodenum with a standard blue load stapler.  The anvil was pierced through the duodenal stump in the standard fashion, Anterior gastrotomy was performed and using the EEA stapler and anastomosis was created between the antrum and  the second portion of the duodenum. The gastrotomy was closed with another green load stapler. We perform an anastomotic leak test and there was no evidence of any anastomotic leak at the Billroth I anastomosis or at the gastrotomy.  We confirmed the position of the NG tube that was proximal to our anastomosis. Tension was then turned to the jejunum.  Ligament of Treitz was visualized and distal to this we selected a piece of jejunum.  We placed 2 pursestrings on the antimesenteric border of the jejunum.  Enterotomy was performed on red rubber catheter #16 [place] was placed.  Please also note that we place multiple holes along the course of the red rubber catheter. A Witzel  jejunostomy was created using multiple interrupted seromuscular 2-0 silk sutures.  Make sure that there was no leak with a jejunostomy and we attach it with 3 multiple sutures to the abdominal wall. A 19 Blake drain was placed on top of the anastomosis and gastrotomy.  I was also able to mobilize a piece of omentum and place it on top of the anastomosis.   We changed gloves and place a new tray to close the abdomen with a 0 PDS suture in a running fashion and the skin was closed with staples. Liposomal Marcaine was injected on all incision sites under direct visualization. Needle and laparotomy count were correct and there were no immediate complications.  Caroleen Hamman, MD, FACS

## 2019-02-27 NOTE — Transfer of Care (Signed)
Immediate Anesthesia Transfer of Care Note  Patient: Keith [name]  Procedure(s) Performed: GASTROJEJUNOSTOMY (N/A )  Patient Location: PACU  Anesthesia Type:General  Level of Consciousness: sedated and patient cooperative  Airway & Oxygen Therapy: Patient Spontanous Breathing  Post-op Assessment: Report given to RN and Post -op Vital signs reviewed and stable  Post vital signs: Reviewed and stable  Last Vitals:  Vitals Value Taken Time  BP 143/84 02/27/19 1203  Temp    Pulse 67 02/27/19 1206  Resp 23 02/27/19 1206  SpO2 95 % 02/27/19 1206  Vitals shown include unvalidated device data.  Last Pain:  Vitals:   02/27/19 0759  TempSrc: Tympanic  PainSc:          Complications: No apparent anesthesia complications

## 2019-02-27 NOTE — Progress Notes (Signed)
Pt seen and examined. D/W him about the operation, resection if feasible vs bypass. If possible highly selective vagotomy. Potential feeding jejunostomy. He understands and agrees. Reluctant pyloric stenosis from PUD.

## 2019-02-27 NOTE — Anesthesia Postprocedure Evaluation (Signed)
Anesthesia Post Note  Patient: Keith [name]  Procedure(s) Performed: GASTROJEJUNOSTOMY (N/A )  Patient location during evaluation: PACU Anesthesia Type: General Level of consciousness: awake and alert and oriented Pain management: pain level controlled Vital Signs Assessment: post-procedure vital signs reviewed and stable Respiratory status: spontaneous breathing, nonlabored ventilation and respiratory function stable Cardiovascular status: blood pressure returned to baseline and stable Postop Assessment: no signs of nausea or vomiting Anesthetic complications: no     Last Vitals:  Vitals:   02/27/19 1303 02/27/19 1318  BP: 126/87 123/86  Pulse: 62 64  Resp: (!) 9 13  Temp:    SpO2: 94% 93%    Last Pain:  Vitals:   02/27/19 1318  TempSrc:   PainSc: Asleep                 Allanna Bresee

## 2019-02-27 NOTE — Anesthesia Post-op Follow-up Note (Signed)
Anesthesia QCDR form completed.        

## 2019-02-27 NOTE — Progress Notes (Addendum)
Nutrition Follow Up Note   DOCUMENTATION CODES:   Non-severe (moderate) malnutrition in context of social or environmental circumstances  INTERVENTION:   Once J-tube ready for use, recommend:  Osmolite 1.5 '@50ml' /hr- Initiate at 49m/hr and advance by 123mhr q 8 hours until goal rate is reached  Prostat liquid protein 30 ml daily via tube, each supplement provides 100 kcal, 15 grams protein.  Free water flushes 306m4 hours to maintain tube patency   Regimen provides 1900kcal/day, 90g/day protein, 1094m57my free water   Pt at refeed risk; recommend monitor K, Mg and P labs daily  NUTRITION DIAGNOSIS:   Moderate Malnutrition related to social / environmental circumstances(polysubstance abuse) as evidenced by moderate fat depletion, moderate muscle depletion, severe muscle depletion.  GOAL:   Patient will meet greater than or equal to 90% of their needs  -not met   MONITOR:   Diet advancement, Labs, Weight trends, TF tolerance, Skin, I & O's  ASSESSMENT:   68 y48r old male with PMHx of paroxysmal A-fib, HTN, polysubstance abuse (cocaine positive), hx of gastric and duodenal ulcers admitted with epigastric pain likely from underlying ulcers.   Pt s/p EGD 11/17, found to have LA Grade D reflux esophagitis with no bleeding, gastric stenosis was found at the pylorus which was dilated, non-bleeding duodenal ulcer  Pt s/p Billroth I today with anastomosis created between the antrum and the second portion of the duodenum. Pt s/p placement of red rubber catheter #16 French Jejunostomy tube and 19 Blake drain   Pt on NPO/clear liquid diet since admit. J-tube placed by surgery today. Tube feed recommendations above once appropriate to use tube. Pt is at high refeed risk.   Medications reviewed and include: B12, MVI, nicotine, protonix, miralax, senokot, LRS '@75ml' /hr  Labs reviewed: K 4.2 wnl, P 3.7 wnl, Mg 2.1 wnl Wbc- 10.7(H) B12 173(L)- 11/16  Diet Order:   Diet Order         Diet NPO time specified  Diet effective midnight             EDUCATION NEEDS:   No education needs have been identified at this time  Skin:  Skin Assessment: Reviewed RN Assessment  Last BM:  11/16- type 7  Height:   Ht Readings from Last 1 Encounters:  02/23/19 '5\' 7"'  (1.702 m)   Weight:   Wt Readings from Last 1 Encounters:  02/27/19 60.8 kg   Ideal Body Weight:  67.3 kg  BMI:  Body mass index is 20.99 kg/m.  Estimated Nutritional Needs:   Kcal:  1700-2000kcal/day  Protein:  85-100g/day  Fluid:  1.5-1.8L/day  CaseKoleen Distance RD, LDN Pager #- [phone]#- [phone] Hours Pager: [phone]

## 2019-02-27 NOTE — Anesthesia Procedure Notes (Signed)
Procedure Name: Intubation Performed by: Staci Acosta, CRNA Pre-anesthesia Checklist: Patient identified, Patient being monitored, Timeout performed, Emergency Drugs available and Suction available Patient Re-evaluated:Patient Re-evaluated prior to induction Oxygen Delivery Method: Circle system utilized Preoxygenation: Pre-oxygenation with 100% oxygen Induction Type: IV induction Ventilation: Mask ventilation without difficulty Laryngoscope Size: Mac and 3 Grade View: Grade I Tube type: Oral Tube size: 7.0 mm Number of attempts: 1 Airway Equipment and Method: Stylet Placement Confirmation: ETT inserted through vocal cords under direct vision,  positive ETCO2 and breath sounds checked- equal and bilateral Secured at: 21 cm Tube secured with: Tape Dental Injury: Teeth and Oropharynx as per pre-operative assessment

## 2019-02-27 NOTE — Progress Notes (Signed)
PROGRESS NOTE    Keith [name]  [id number] DOB: 10/21/1950 DOA: 02/23/2019  PCP: Patient, No Pcp Per    LOS - 4   Brief Narrative:  68 y.o.malewith medical history ofPUD, hypertension, a-fib not on anticoagulationsecondary to PUD,presented with chest and epigastric pain.  EGD back on 5/6/2020which showed LA grade D esophagitis, non-bleeding gastric ulcer, congested friable mucosa in the pylorus s/p epinephrine and large duodenal ulcer with duodenal stenosis.plan was for pt ot have repeat egd and he did not follow up.  Patient reports attempts to be seen at [facility] but has been unable to get through to schedule.   In the ED, HR 40, BP 100/76, otherwise vitals normal. Abdominal xray showed nonobstructive gas pattern.  Patient admitted for further evaluation.  Gastroenterology consulted.  Patient underwent EGD 11/17 which showed pyloric stenosis and LA grade D reflux esophagitis without active bleeding.  General surgery consulted and patient taken to surgery on 11/18.     Subjective 11/18: Patient seen following surgery today.  He was sleeping comfortably, but awoke easily.  Reports pain currently well controlled.  Denies fever/chills, SOB, N/V or other complaints.  No acute events reported overnight.  Assessment & Plan:   Principal Problem:   Acute epigastric pain Active Problems:   Atrial fibrillation (HCC)   Abdominal pain   Left-sided chest pain   Essential hypertension   Tobacco use disorder   Moderate protein-energy malnutrition (HCC)   Acute kidney injury superimposed on CKD (HCC)   Hypokalemia   Troponin level elevated   Dehydration   Acute gastritis without hemorrhage   Vitamin B12 deficiency   Intractable vomiting   Postpyloric ulcer   Acute epigastric pain/chest pain secondary to Gastric Outlet Obstruction due to pyloric stenosis History of Peptic Ulcer Disease, chronic, stable History of Reflux Esophagitis, chronic, stable See above narrative for hospital  course to date.  Surgery done for pyloric stenosis today, 11/18.  No evidence of bleeding at this time. - GI and general surgery following - continue Protonix 40 mg IV q12h - pain management post-op  Paroxysmal atrial fibrillation - rate controlled - Not on anticoagulation due to PUD   - follow-up with cardiology outpatient  Hypokalemia - repleted Hypomagnesemia - repleted - monitor daily  - replete as needed for K>4.0 and Mg>2.0  Mildly elevated high-sensitivity troponin Troponin levels flattened.  Not consistent with ACS, more likely demand ischemia.   - cardiology recommends outpatient stress test  Abnormal thyroid function studies Will need repeat thyroid function studies done in about 4 to 6 weeks in the outpatient setting and if abnormal will need further evaluation by PCP.  Hypertension - chronic, stable Blood pressure elevated today.   - continue home Norvasc   Tobacco abuse Tobacco cessation stressed to patient.  Continue nicotine patch.  Acute kidney injury on chronic kidney disease Unclear baseline.  Renal function improving with hydration.  Urinalysis nitrite negative, leukocytes negative, negative for protein.  Urine sodium was 21.  Urine creatinine of 90.  Avoid nephrotoxins.  Decreasedd IV fluids to 75 cc/h.  Follow.   Constipation Patient states that bowel movements yesterday after sorbitol.  Continue current bowel regimen of MiraLAX daily and Senokot-S nightly.   Moderate protein calorie malnutrition  Vitamin B12 deficiency Vitamin B12 levels at 173.  Continue vitamin B12 1000 MCG subcutaneously daily x1 week, and then weekly x1 month, and then monthly.  Will need outpatient follow-up with PCP.    DVT prophylaxis: SCD's   Code Status: Full Code  Family Communication: none at bedside  Disposition Plan:  Pending clinical improvement and clearance by GI and surgery.   Consultants:   Gastroenterology  General surgery  Procedures:   EGD  11/18  Surgery 11/19  Antimicrobials:   none   Objective: Vitals:   02/26/19 1821 02/26/19 1953 02/27/19 0427 02/27/19 0759  BP: (!) 164/103 (!) 151/92 130/89 (!) 151/93  Pulse: (!) 49 (!) 58 63 (!) 49  Resp:  18 19 20   Temp:  97.6 F (36.4 C) 98 F (36.7 C) 98.5 F (36.9 C)  TempSrc:  Oral Oral Tympanic  SpO2: 99% 100% 100% 100%  Weight:   60.8 kg   Height:        Intake/Output Summary (Last 24 hours) at 02/27/2019 0828 Last data filed at 02/26/2019 2319 Gross per 24 hour  Intake 800 ml  Output 870 ml  Net -70 ml   Filed Weights   02/25/19 0433 02/26/19 0239 02/27/19 0427  Weight: 61.6 kg 60.1 kg 60.8 kg    Examination:  General exam: sleeping comfortably, easily arousable, no acute distress, frail, chronically ill appearing Respiratory system: clear to auscultation bilaterally, no wheezes, rales or rhonchi, normal respiratory effort. Cardiovascular system: normal S1/S2, RRR, no JVD, murmurs, rubs, gallops, no pedal edema.   Gastrointestinal system: soft, tender on palpation consistent with post-op, non-distended abdomen  Central nervous system: no gross focal neurologic deficits, normal speech Extremities: moves all, no edema, normal tone Psychiatry: normal mood, congruent affect, judgement and insight appear normal    Data Reviewed: I have personally reviewed following labs and imaging studies  CBC: Recent Labs  Lab 02/23/19 1414 02/24/19 0834 02/25/19 0408 02/26/19 0504 02/27/19 0602  WBC 12.7* 8.3 6.4 6.3 10.7*  NEUTROABS  --  4.5  --   --   --   HGB 14.7 11.8* 10.6* 11.5* 12.3*  HCT 42.1 34.5* 31.6* 33.7* 34.7*  MCV 81.4 84.4 84.7 83.8 80.9  PLT 334 250 241 256 [id number]   Basic Metabolic Panel: Recent Labs  Lab 02/23/19 1414 02/24/19 0611 02/25/19 0408 02/26/19 0504 02/27/19 0602  NA 135 132* 135 135 135  K 3.0* 2.6* 4.0 3.9 4.2  CL 85* 99 106 106 105  CO2 31 25 23 24 22   GLUCOSE 111* 89 85 86 77  BUN 28* 26* 16 7* 7*  CREATININE 2.70*  1.60* 1.26* 1.16 1.18  CALCIUM 9.6 7.6* 7.8* 8.1* 8.6*  MG 2.1  --  1.8 1.6* 2.1  PHOS  --   --   --   --  3.7   GFR: Estimated Creatinine Clearance: 51.5 mL/min (by C-G formula based on SCr of 1.18 mg/dL). Liver Function Tests: Recent Labs  Lab 02/23/19 1414 02/24/19 0611  AST 25 16  ALT 11 8  ALKPHOS 79 54  BILITOT 0.7 0.8  PROT 8.7* 6.2*  ALBUMIN 4.7 3.2*   Recent Labs  Lab 02/23/19 1414 02/24/19 0834  LIPASE 44 46   No results for input(s): AMMONIA in the last 168 hours. Coagulation Profile: No results for input(s): INR, PROTIME in the last 168 hours. Cardiac Enzymes: Recent Labs  Lab 02/24/19 0611  CKTOTAL 138   BNP (last 3 results) No results for input(s): PROBNP in the last 8760 hours. HbA1C: No results for input(s): HGBA1C in the last 72 hours. CBG: No results for input(s): GLUCAP in the last 168 hours. Lipid Profile: No results for input(s): CHOL, HDL, LDLCALC, TRIG, CHOLHDL, LDLDIRECT in the last 72 hours. Thyroid Function Tests: No  results for input(s): TSH, T4TOTAL, FREET4, T3FREE, THYROIDAB in the last 72 hours. Anemia Panel: Recent Labs    02/25/19 0408  VITAMINB12 173*  FOLATE 11.1  FERRITIN 155  TIBC 250  IRON 90   Sepsis Labs: Recent Labs  Lab 02/23/19 2051 02/23/19 2142  LATICACIDVEN 1.0 1.2    Recent Results (from the past 240 hour(s))  SARS CORONAVIRUS 2 (TAT 6-24 HRS) Nasopharyngeal Nasopharyngeal Swab     Status: None   Collection Time: 02/23/19  4:30 PM   Specimen: Nasopharyngeal Swab  Result Value Ref Range Status   SARS Coronavirus 2 NEGATIVE NEGATIVE Final    Comment: (NOTE) SARS-CoV-2 target nucleic acids are NOT DETECTED. The SARS-CoV-2 RNA is generally detectable in upper and lower respiratory specimens during the acute phase of infection. Negative results do not preclude SARS-CoV-2 infection, do not rule out co-infections with other pathogens, and should not be used as the sole basis for treatment or other patient  management decisions. Negative results must be combined with clinical observations, patient history, and epidemiological information. The expected result is Negative. Fact Sheet for Patients: SugarRoll.be Fact Sheet for Healthcare Providers: https://www.woods-mathews.com/ This test is not yet approved or cleared by the [place] FDA and  has been authorized for detection and/or diagnosis of SARS-CoV-2 by FDA under an Emergency Use Authorization (EUA). This EUA will remain  in effect (meaning this test can be used) for the duration of the COVID-19 declaration under Section 56 4(b)(1) of the Act, 21 U.S.C. section 360bbb-3(b)(1), unless the authorization is terminated or revoked sooner. Performed at [facility], [place] [address] [place], [place] [address]   Urine culture     Status: Abnormal   Collection Time: 02/24/19 10:17 AM   Specimen: Urine, Catheterized  Result Value Ref Range Status   Specimen Description   Final    URINE, CATHETERIZED Performed at [facility], [address], [place], [place] [address]    Special Requests   Final    NONE Performed at [facility], [address] [place], [place] [address]    Culture (A)  Final    <10,000 COLONIES/mL INSIGNIFICANT GROWTH Performed at [facility], [place] [address], [place], [place] [address]    Report Status 02/25/2019 FINAL  Final         Radiology Studies: No results found.      Scheduled Meds: . [MAR Hold] amLODipine  10 mg Oral Daily  . [MAR Hold] cyanocobalamin  1,000 mcg Subcutaneous Daily  . [MAR Hold] feeding supplement  1 Container Oral TID BM  . [MAR Hold] hydrocerin   Topical BID  . [MAR Hold] multivitamin with minerals  1 tablet Oral Daily  . [MAR Hold] nicotine  14 mg Transdermal Daily  . [MAR Hold] pantoprazole (PROTONIX) IV  40 mg Intravenous Q12H  . [MAR Hold] polyethylene glycol  17 g Oral Daily  . [MAR Hold]  senna-docusate  1 tablet Oral BID  . [MAR Hold] triamcinolone cream   Topical TID   Continuous Infusions: . [MAR Hold] cefoTEtan (CEFOTAN) IV    . lactated ringers 75 mL/hr at 02/26/19 2305     LOS: 4 days    Time spent: 30-35 minutes    Ezekiel Slocumb, DO [facility] Pager: [phone]  If 7PM-7AM, please contact night-coverage [email or web address] Password [employer] 02/27/2019, 8:28 AM

## 2019-02-27 NOTE — Anesthesia Preprocedure Evaluation (Signed)
Anesthesia Evaluation  Patient identified by MRN, date of birth, ID band Patient awake    Reviewed: Allergy & Precautions, NPO status , Patient's Chart, lab work & pertinent test results  History of Anesthesia Complications Negative for: history of anesthetic complications  Airway Mallampati: II  TM Distance: >3 FB Neck ROM: Full    Dental  (+) Poor Dentition, Missing   Pulmonary asthma , Current Smoker and Patient abstained from smoking.,    breath sounds clear to auscultation- rhonchi (-) wheezing      Cardiovascular hypertension, Pt. on medications (-) CAD, (-) Past MI, (-) Cardiac Stents and (-) CABG + dysrhythmias Atrial Fibrillation  Rhythm:Regular Rate:Normal - Systolic murmurs and - Diastolic murmurs    Neuro/Psych neg Seizures negative neurological ROS  negative psych ROS   GI/Hepatic Neg liver ROS, PUD,   Endo/Other  negative endocrine ROSneg diabetes  Renal/GU Renal InsufficiencyRenal disease     Musculoskeletal negative musculoskeletal ROS (+)   Abdominal (+) - obese,   Peds  Hematology negative hematology ROS (+)   Anesthesia Other Findings Past Medical History: No date: CKD (chronic kidney disease), stage II No date: Cocaine abuse (HCC) No date: Duodenal ulcer No date: Gastric ulcer No date: Hypertension No date: Noncompliance No date: Paroxysmal atrial fibrillation (HCC) No date: Tobacco abuse   Reproductive/Obstetrics                             Anesthesia Physical Anesthesia Plan  ASA: III  Anesthesia Plan: General   Post-op Pain Management:    Induction: Intravenous  PONV Risk Score and Plan: 0 and Ondansetron  Airway Management Planned: Oral ETT  Additional Equipment:   Intra-op Plan:   Post-operative Plan: Extubation in OR  Informed Consent: I have reviewed the patients History and Physical, chart, labs and discussed the procedure including the  risks, benefits and alternatives for the proposed anesthesia with the patient or authorized representative who has indicated his/her understanding and acceptance.     Dental advisory given  Plan Discussed with: CRNA and Anesthesiologist  Anesthesia Plan Comments:         Anesthesia Quick Evaluation

## 2019-02-28 ENCOUNTER — Encounter: Payer: Self-pay | Admitting: Surgery

## 2019-02-28 DIAGNOSIS — R109 Unspecified abdominal pain: Secondary | ICD-10-CM

## 2019-02-28 LAB — CBC
HCT: 33.5 % — ABNORMAL LOW (ref 39.0–52.0)
Hemoglobin: 11.8 g/dL — ABNORMAL LOW (ref 13.0–17.0)
MCH: 29.4 pg (ref 26.0–34.0)
MCHC: 35.2 g/dL (ref 30.0–36.0)
MCV: 83.5 fL (ref 80.0–100.0)
Platelets: 258 10*3/uL (ref 150–400)
RBC: 4.01 MIL/uL — ABNORMAL LOW (ref 4.22–5.81)
RDW: 15 % (ref 11.5–15.5)
WBC: 13.8 10*3/uL — ABNORMAL HIGH (ref 4.0–10.5)
nRBC: 0 % (ref 0.0–0.2)

## 2019-02-28 LAB — BASIC METABOLIC PANEL
Anion gap: 11 (ref 5–15)
BUN: 11 mg/dL (ref 8–23)
CO2: 19 mmol/L — ABNORMAL LOW (ref 22–32)
Calcium: 8.3 mg/dL — ABNORMAL LOW (ref 8.9–10.3)
Chloride: 102 mmol/L (ref 98–111)
Creatinine, Ser: 1.26 mg/dL — ABNORMAL HIGH (ref 0.61–1.24)
GFR calc Af Amer: >60 mL/min (ref >60)
GFR calc non Af Amer: 58 mL/min — ABNORMAL LOW (ref >60)
Glucose, Bld: 105 mg/dL — ABNORMAL HIGH (ref 70–99)
Potassium: 4.3 mmol/L (ref 3.5–5.1)
Sodium: 132 mmol/L — ABNORMAL LOW (ref 135–145)

## 2019-02-28 LAB — MAGNESIUM: Magnesium: 1.8 mg/dL (ref 1.7–2.4)

## 2019-02-28 LAB — PHOSPHORUS: Phosphorus: 3.3 mg/dL (ref 2.5–4.6)

## 2019-02-28 MED ORDER — LABETALOL HCL 5 MG/ML IV SOLN
10.0000 mg | INTRAVENOUS | Status: DC | PRN
  Administered 2019-02-28 – 2019-03-01 (×2): 10 mg via INTRAVENOUS
  Filled 2019-02-28 (×2): qty 4

## 2019-02-28 MED ORDER — OSMOLITE 1.5 CAL PO LIQD
1000.0000 mL | ORAL | Status: DC
  Administered 2019-02-28: 1000 mL

## 2019-02-28 NOTE — Plan of Care (Signed)
  Problem: Nutrition: Goal: Adequate nutrition will be maintained Outcome: Progressing   Problem: Coping: Goal: Level of anxiety will decrease Outcome: Progressing   Problem: Pain Managment: Goal: General experience of comfort will improve Outcome: Progressing   Problem: Safety: Goal: Ability to remain free from injury will improve Outcome: Progressing   

## 2019-02-28 NOTE — Progress Notes (Signed)
PROGRESS NOTE    Keith [name]  [id number] DOB: June 13, 1950 DOA: 02/23/2019  PCP: Patient, No Pcp Per    LOS - 5   Brief Narrative:  68 y.o.malewith medical history ofPUD, hypertension, a-fib not on anticoagulationsecondary to PUD,presented with chest and epigastric pain.  EGD back on 5/6/2020which showed LA grade D esophagitis, non-bleeding gastric ulcer, congested friable mucosa in the pylorus s/p epinephrine and large duodenal ulcer with duodenal stenosis.plan was for pt ot have repeat egd and he did not follow up.  Patient reports attempts to be seen at [facility] but has been unable to get through to schedule.   In the ED, HR 40, BP 100/76, otherwise vitals normal. Abdominal xray showed nonobstructive gas pattern.  Patient admitted for further evaluation.  Gastroenterology consulted.  Patient underwent EGD 11/17 which showed pyloric stenosis and LA grade D reflux esophagitis without active bleeding.  General surgery consulted and patient taken to surgery on 11/18 for antrectomy with Bilroth 1 reconstruction, vagotomy and jejunostomy feeding tube placement.    Subjective 11/19: POD #1.  No acute events reported overnight.  Patient awake in bed, writhing in pain.  Reports 10 of 10 pain approximately 2 hours after morphine.  He denies other acute complaints including fever/chills, N/V/D, chest pain or SOB.  States it hurts to take deep breath.     Assessment & Plan:   Principal Problem:   Acute epigastric pain Active Problems:   Atrial fibrillation (HCC)   Abdominal pain   Left-sided chest pain   Essential hypertension   Tobacco use disorder   Moderate protein-energy malnutrition (HCC)   Acute kidney injury superimposed on CKD (HCC)   Hypokalemia   Troponin level elevated   Dehydration   Acute gastritis without hemorrhage   Vitamin B12 deficiency   Intractable vomiting   Postpyloric ulcer   Acute epigastric pain/chest pain secondary to Gastric Outlet Obstruction due to  pyloric stenosis History of Peptic Ulcer Disease, chronic, stable History of Reflux Esophagitis, chronic, stable See above narrative for hospital course to date. Surgery done for pyloric stenosis 11/18.   - GI and general surgery following   NG tube, tube feeds, drain etc per surgery - continue Protonix 40 mg IV q12h - pain management post-op - mobilize patient, PT  Paroxysmal atrial fibrillation - rate controlled - Not on anticoagulation due to PUD  - follow-up with cardiology outpatient  Hypokalemia - repleted Hypomagnesemia - repleted - monitor daily  - replete as needed for K>4.0 and Mg>2.0  Mildly elevated high-sensitivity troponin Troponin levels flattened. Not consistent with ACS, more likely demand ischemia.  - cardiology recommends outpatient stress test  Abnormal thyroid function studies Will need repeat thyroid function studies done in about 4 to 6 weeks in the outpatient setting and if abnormal will need further evaluation by PCP.  Hypertension - chronic, stable Blood pressure elevated today. - continue home Norvasc   Tobacco abuse Tobacco cessation stressed to patient. Continue nicotine patch.  Acute kidney injury on chronic kidney disease Unclear baseline. Renal function improving with hydration. Urinalysis nitrite negative, leukocytes negative, negative for protein. Urine sodium was 21. Urine creatinine of 90. Avoid nephrotoxins. DecreaseddIV fluids to 75 cc/h. Follow.   Constipation Patient states that bowel movements yesterday after sorbitol. Continue current bowel regimen of MiraLAX daily and Senokot-S nightly.   Moderate protein calorie malnutrition  Vitamin B12 deficiency Vitamin B12 levels at 173.Continuevitamin B12 1000 MCG subcutaneously daily x1 week, and then weekly x1 month, and then monthly. Will need  outpatient follow-up with PCP.    DVT prophylaxis: SCDs   Code Status: Full Code  Family Communication: None at  bedside Disposition Plan: Pending improvement in clearance by GI and surgery   Consultants:   Gastroenterology  General surgery  Procedures:   EGD 11/18  Surgery 11/19  Antimicrobials:   None    Objective: Vitals:   02/27/19 2004 02/28/19 0150 02/28/19 0440 02/28/19 0725  BP: 132/83 (!) 160/99 (!) 150/90 (!) 163/94  Pulse: 77 60 64 72  Resp: 18  16   Temp: 97.7 F (36.5 C)  98 F (36.7 C) 98.4 F (36.9 C)  TempSrc: Oral  Oral Oral  SpO2: 100%  99% 100%  Weight:   59.6 kg   Height:        Intake/Output Summary (Last 24 hours) at 02/28/2019 0759 Last data filed at 02/28/2019 0740 Gross per 24 hour  Intake 1500 ml  Output 2840 ml  Net -1340 ml   Filed Weights   02/26/19 0239 02/27/19 0427 02/28/19 0440  Weight: 60.1 kg 60.8 kg 59.6 kg    Examination:  General exam: awake, alert, no acute distress  Respiratory system: Decreased breath sounds secondary to poor inspiratory effort, no wheezes, rales or rhonchi, normal respiratory effort. Cardiovascular system: normal S1/S2, RRR, no JVD, murmurs, rubs, gallops, no pedal edema.   Gastrointestinal system: Tender on even light palpation, soft, non-distended abdomen, no organomegaly or masses felt, normal bowel sounds. Central nervous system: alert and oriented x4. no gross focal neurologic deficits, normal speech Extremities: moves all, no edema, normal tone Psychiatry: normal mood, congruent affect    Data Reviewed: I have personally reviewed following labs and imaging studies  CBC: Recent Labs  Lab 02/24/19 0834 02/25/19 0408 02/26/19 0504 02/27/19 0602 02/28/19 0519  WBC 8.3 6.4 6.3 10.7* 13.8*  NEUTROABS 4.5  --   --   --   --   HGB 11.8* 10.6* 11.5* 12.3* 11.8*  HCT 34.5* 31.6* 33.7* 34.7* 33.5*  MCV 84.4 84.7 83.8 80.9 83.5  PLT 250 241 256 273 0000000   Basic Metabolic Panel: Recent Labs  Lab 02/23/19 1414 02/24/19 0611 02/25/19 0408 02/26/19 0504 02/27/19 0602 02/28/19 0519  NA 135 132*  135 135 135 132*  K 3.0* 2.6* 4.0 3.9 4.2 4.3  CL 85* 99 106 106 105 102  CO2 31 25 23 24 22  19*  GLUCOSE 111* 89 85 86 77 105*  BUN 28* 26* 16 7* 7* 11  CREATININE 2.70* 1.60* 1.26* 1.16 1.18 1.26*  CALCIUM 9.6 7.6* 7.8* 8.1* 8.6* 8.3*  MG 2.1  --  1.8 1.6* 2.1 1.8  PHOS  --   --   --   --  3.7 3.3   GFR: Estimated Creatinine Clearance: 47.3 mL/min (A) (by C-G formula based on SCr of 1.26 mg/dL (H)). Liver Function Tests: Recent Labs  Lab 02/23/19 1414 02/24/19 0611  AST 25 16  ALT 11 8  ALKPHOS 79 54  BILITOT 0.7 0.8  PROT 8.7* 6.2*  ALBUMIN 4.7 3.2*   Recent Labs  Lab 02/23/19 1414 02/24/19 0834  LIPASE 44 46   No results for input(s): AMMONIA in the last 168 hours. Coagulation Profile: No results for input(s): INR, PROTIME in the last 168 hours. Cardiac Enzymes: Recent Labs  Lab 02/24/19 0611  CKTOTAL 138   BNP (last 3 results) No results for input(s): PROBNP in the last 8760 hours. HbA1C: No results for input(s): HGBA1C in the last 72 hours. CBG: No  results for input(s): GLUCAP in the last 168 hours. Lipid Profile: No results for input(s): CHOL, HDL, LDLCALC, TRIG, CHOLHDL, LDLDIRECT in the last 72 hours. Thyroid Function Tests: No results for input(s): TSH, T4TOTAL, FREET4, T3FREE, THYROIDAB in the last 72 hours. Anemia Panel: No results for input(s): VITAMINB12, FOLATE, FERRITIN, TIBC, IRON, RETICCTPCT in the last 72 hours. Sepsis Labs: Recent Labs  Lab 02/23/19 2051 02/23/19 2142  LATICACIDVEN 1.0 1.2    Recent Results (from the past 240 hour(s))  SARS CORONAVIRUS 2 (TAT 6-24 HRS) Nasopharyngeal Nasopharyngeal Swab     Status: None   Collection Time: 02/23/19  4:30 PM   Specimen: Nasopharyngeal Swab  Result Value Ref Range Status   SARS Coronavirus 2 NEGATIVE NEGATIVE Final    Comment: (NOTE) SARS-CoV-2 target nucleic acids are NOT DETECTED. The SARS-CoV-2 RNA is generally detectable in upper and lower respiratory specimens during the acute  phase of infection. Negative results do not preclude SARS-CoV-2 infection, do not rule out co-infections with other pathogens, and should not be used as the sole basis for treatment or other patient management decisions. Negative results must be combined with clinical observations, patient history, and epidemiological information. The expected result is Negative. Fact Sheet for Patients: SugarRoll.be Fact Sheet for Healthcare Providers: https://www.woods-mathews.com/ This test is not yet approved or cleared by the [place] FDA and  has been authorized for detection and/or diagnosis of SARS-CoV-2 by FDA under an Emergency Use Authorization (EUA). This EUA will remain  in effect (meaning this test can be used) for the duration of the COVID-19 declaration under Section 56 4(b)(1) of the Act, 21 U.S.C. section 360bbb-3(b)(1), unless the authorization is terminated or revoked sooner. Performed at [facility], [place] [address] [place], [place] [address]   Urine culture     Status: Abnormal   Collection Time: 02/24/19 10:17 AM   Specimen: Urine, Catheterized  Result Value Ref Range Status   Specimen Description   Final    URINE, CATHETERIZED Performed at [facility], [address] [place], [place] [address]    Special Requests   Final    NONE Performed at [facility], [address] [place], [place] [address]    Culture (A)  Final    <10,000 COLONIES/mL INSIGNIFICANT GROWTH Performed at [facility], [place] [address] [place], [place] [address]    Report Status 02/25/2019 FINAL  Final         Radiology Studies: No results found.      Scheduled Meds: . amLODipine  10 mg Oral Daily  . Chlorhexidine Gluconate Cloth  6 each Topical Daily  . cyanocobalamin  1,000 mcg Subcutaneous Daily  . feeding supplement (PRO-STAT SUGAR FREE 64)  30 mL Per Tube Daily  . free water  30 mL Per Tube Q4H  .  hydrocerin   Topical BID  . nicotine  14 mg Transdermal Daily  . pantoprazole (PROTONIX) IV  40 mg Intravenous Q12H  . triamcinolone cream   Topical TID   Continuous Infusions: . acetaminophen 1,000 mg (02/28/19 0459)  . cefoTEtan (CEFOTAN) IV    . feeding supplement (OSMOLITE 1.5 CAL) 1,000 mL (02/27/19 1606)  . lactated ringers 75 mL/hr at 02/27/19 1931     LOS: 5 days    Time spent: 25-30 minutes    Ezekiel Slocumb, DO [facility] Pager: [phone]  If 7PM-7AM, please contact night-coverage [email or web address] Password [email or web address] 02/28/2019, 7:59 AM

## 2019-02-28 NOTE — Progress Notes (Addendum)
[place] Hospital Day(s): 5.   Post op day(s): 1 Day Post-Op.   Interval History:  Patient seen and examined no acute events or new complaints overnight.  Patient reports he is having significant abdominal soreness at his incisions No fever, chills, nausea,or emesis Started on trickle feedings through jejunostomy tube yesterday With NGT - 100 ccs out JP 50 ccs - serous Slight bump in renal function and leukocytosis Has not mobilized  Vital signs in last 24 hours: [min-max] current  Temp:  [96.5 F (35.8 C)-98.5 F (36.9 C)] 98.4 F (36.9 C) (11/19 0725) Pulse Rate:  [49-77] 72 (11/19 0725) Resp:  [9-20] 16 (11/19 0440) BP: (123-163)/(75-99) 163/94 (11/19 0725) SpO2:  [93 %-100 %] 100 % (11/19 0725) Weight:  [59.6 kg] 59.6 kg (11/19 0440)     Height: 5\' 7"  (170.2 cm) Weight: 59.6 kg BMI (Calculated): 20.56   Intake/Output last 2 shifts:  11/18 0701 - 11/19 0700 In: 1500 [I.V.:1500] Out: 2740 [Urine:2440; Drains:50; Blood:50]   Physical Exam:  Constitutional: alert, cooperative and no distress  HEENT: NGT in place Respiratory: breathing non-labored at rest  Cardiovascular: regular rate and sinus rhythm  Gastrointestinal: soft, expected incisional soreness, and non-distended. JP present with serosanguinous output. Jejunostomy in place, receiving tube feedings Integumentary: Laparotomy incision is CDI with staples and honeycomb dressing   Labs:  CBC Latest Ref Rng & Units 02/28/2019 02/27/2019 02/26/2019  WBC 4.0 - 10.5 K/uL 13.8(H) 10.7(H) 6.3  Hemoglobin 13.0 - 17.0 g/dL 11.8(L) 12.3(L) 11.5(L)  Hematocrit 39.0 - 52.0 % 33.5(L) 34.7(L) 33.7(L)  Platelets 150 - 400 K/uL 258 273 256   CMP Latest Ref Rng & Units 02/28/2019 02/27/2019 02/26/2019  Glucose 70 - 99 mg/dL 105(H) 77 86  BUN 8 - 23 mg/dL 11 7(L) 7(L)  Creatinine 0.61 - 1.24 mg/dL 1.26(H) 1.18 1.16  Sodium 135 - 145 mmol/L 132(L) 135 135  Potassium 3.5 - 5.1 mmol/L  4.3 4.2 3.9  Chloride 98 - 111 mmol/L 102 105 106  CO2 22 - 32 mmol/L 19(L) 22 24  Calcium 8.9 - 10.3 mg/dL 8.3(L) 8.6(L) 8.1(L)  Total Protein 6.5 - 8.1 g/dL - - -  Total Bilirubin 0.3 - 1.2 mg/dL - - -  Alkaline Phos 38 - 126 U/L - - -  AST 15 - 41 U/L - - -  ALT 0 - 44 U/L - - -     Imaging studies: No new pertinent imaging studies   Assessment/Plan:  68 y.o. male with expected post-surgical soreness and receiving trickle tube feedings 1 Day Post-Op s/p antrectomy with Bilroth 1 reconstruction, vagotomy, and feeding jejunostomy tube placement for GOO.   - Continue NPO + IVF  - Continue NGT decompression; he will need to be studied ~ 48 post-op to assess repair / anastomosis  - Continue tube feedings: advance as toelrates  - pain control prn; antiemetics prn  - monitor abdominal examination  - continue Blake drain; monitor output  - mobilization encouraged; may need to engage PT early with him   - further management per primary service    All of the above findings and recommendations were discussed with the patient, and the medical team, and all of patient's questions were answered to his expressed satisfaction.  -- Edison Simon, PA-C [facility] 02/28/2019, 7:37 AM [phone] M-F: 7am - 4pm

## 2019-02-28 NOTE — Evaluation (Signed)
Physical Therapy Evaluation Patient Details Name: Keith [name] MRN: [id number] DOB: 12/09/1950 Today's Date: 02/28/2019   History of Present Illness  presented to ER and admitted for management of chest, epigastric pain; noted with gastric outlet obstruction.  Status post gastrectomy, vagotomy and feeding jejunostomy (11/18).  Clinical Impression  Upon evaluation, patient alert and oriented; follows commands and agreeable to participation with session, min encouragement from therapist. Rates pain in abdomen at 9/10; meds requested/administered per RN. Bilat UE/LE strength and ROM grossly symmetrical and WFL; mildly guarded due to abdominal pain.  Educated in log rolling technique and abdominal bracing techqniues for pain management; min cuing for integration into session.  Currently requiring min assist for bed mobility, sit/stand, basic transfers and gait (5') with RW.  Demonstrates slow and deliberate, guarded with mod abdominal bracing for pain management.  Fair/good standing posture, mild reports of stretching in abdomen with upright posture. Do recommend continued use of RW and +1 at this time. Would benefit from skilled PT to address above deficits and promote optimal return to PLOF.;recommend transition to STR upon discharge from acute hospitalization.      Follow Up Recommendations SNF(will progress/update as medically appriate)    Equipment Recommendations  Rolling walker with 5" wheels;3in1 (PT)    Recommendations for Other Services       Precautions / Restrictions Precautions Precautions: Fall Restrictions Weight Bearing Restrictions: No      Mobility  Bed Mobility Overal bed mobility: Needs Assistance Bed Mobility: Supine to Sit     Supine to sit: Min assist     General bed mobility comments: educated in log rolling technique  Transfers Overall transfer level: Needs assistance Equipment used: Rolling walker (2 wheeled) Transfers: Sit to/from Stand Sit to  Stand: Min assist            Ambulation/Gait Ambulation/Gait assistance: Min Web designer (Feet): 5 Feet Assistive device: Rolling walker (2 wheeled)       General Gait Details: slow and deliberate, guarded with mod abdominal bracing for pain management.  Fair/good standing posture, mild reports of stretching in abdomen with upright posture.  Stairs            Wheelchair Mobility    Modified Rankin (Stroke Patients Only)       Balance Overall balance assessment: Needs assistance Sitting-balance support: No upper extremity supported;Feet supported Sitting balance-Leahy Scale: Good     Standing balance support: Bilateral upper extremity supported Standing balance-Leahy Scale: Fair                               Pertinent Vitals/Pain Pain Assessment: 0-10 Pain Score: 9  Pain Location: abdomen Pain Descriptors / Indicators: Aching;Guarding Pain Intervention(s): Limited activity within patient's tolerance;Monitored during session;Repositioned    Home Living Family/patient expects to be discharged to:: Private residence Living Arrangements: Alone Available Help at Discharge: Family;Available PRN/intermittently Type of Home: House Home Access: Level entry     Home Layout: One level Home Equipment: None      Prior Function Level of Independence: Independent               Hand Dominance        Extremity/Trunk Assessment   Upper Extremity Assessment Upper Extremity Assessment: Overall WFL for tasks assessed    Lower Extremity Assessment Lower Extremity Assessment: Overall WFL for tasks assessed(grossly 4/5 throughout)       Communication   Communication: No difficulties  Cognition  Arousal/Alertness: Awake/alert Behavior During Therapy: WFL for tasks assessed/performed Overall Cognitive Status: Within Functional Limits for tasks assessed                                        General Comments       Exercises Other Exercises Other Exercises: Educated in log rolling, abdominal bracing, pain management strategies to maximize safety/indep with functional activities.   Assessment/Plan    PT Assessment Patient needs continued PT services  PT Problem List Decreased strength;Decreased range of motion;Decreased activity tolerance;Decreased balance;Decreased mobility;Decreased knowledge of use of DME;Decreased safety awareness;Decreased knowledge of precautions;Cardiopulmonary status limiting activity;Decreased skin integrity;Pain       PT Treatment Interventions DME instruction;Gait training;Functional mobility training;Therapeutic activities;Therapeutic exercise;Balance training;Patient/family education    PT Goals (Current goals can be found in the Care Plan section)  Acute Rehab PT Goals Patient Stated Goal: to make the pain better PT Goal Formulation: With patient Time For Goal Achievement: 03/14/19 Potential to Achieve Goals: Good    Frequency Min 2X/week   Barriers to discharge        Co-evaluation               AM-PAC PT "6 Clicks" Mobility  Outcome Measure Help needed turning from your back to your side while in a flat bed without using bedrails?: A Little Help needed moving from lying on your back to sitting on the side of a flat bed without using bedrails?: A Little Help needed moving to and from a bed to a chair (including a wheelchair)?: A Little Help needed standing up from a chair using your arms (e.g., wheelchair or bedside chair)?: A Little Help needed to walk in hospital room?: A Little Help needed climbing 3-5 steps with a railing? : A Little 6 Click Score: 18    End of Session Equipment Utilized During Treatment: Gait belt Activity Tolerance: Patient tolerated treatment well Patient left: in chair;with call bell/phone within reach;with chair alarm set Nurse Communication: Mobility status PT Visit Diagnosis: Muscle weakness (generalized)  (M62.81);Difficulty in walking, not elsewhere classified (R26.2);Pain    Time: [id number] PT Time Calculation (min) (ACUTE ONLY): 30 min   Charges:   PT Evaluation $PT Eval Moderate Complexity: 1 Mod PT Treatments $Therapeutic Activity: 8-22 mins       Maisyn Nouri H. Owens Shark, PT, DPT, NCS 02/28/19, 4:41 PM [phone]

## 2019-02-28 NOTE — Care Management Important Message (Signed)
Important Message  Patient Details  Name: Keith [name] MRN: [id number] Date of Birth: 07-30-1950   Medicare Important Message Given:  Yes     Dannette Barbara 02/28/2019, 1:35 PM

## 2019-03-01 DIAGNOSIS — E538 Deficiency of other specified B group vitamins: Secondary | ICD-10-CM

## 2019-03-01 LAB — BASIC METABOLIC PANEL
Anion gap: 9 (ref 5–15)
BUN: 9 mg/dL (ref 8–23)
CO2: 21 mmol/L — ABNORMAL LOW (ref 22–32)
Calcium: 8.7 mg/dL — ABNORMAL LOW (ref 8.9–10.3)
Chloride: 103 mmol/L (ref 98–111)
Creatinine, Ser: 1.04 mg/dL (ref 0.61–1.24)
GFR calc Af Amer: >60 mL/min (ref >60)
GFR calc non Af Amer: >60 mL/min (ref >60)
Glucose, Bld: 116 mg/dL — ABNORMAL HIGH (ref 70–99)
Potassium: 4.1 mmol/L (ref 3.5–5.1)
Sodium: 133 mmol/L — ABNORMAL LOW (ref 135–145)

## 2019-03-01 LAB — CBC WITH DIFFERENTIAL/PLATELET
Abs Immature Granulocytes: 0.06 10*3/uL (ref 0.00–0.07)
Basophils Absolute: 0 10*3/uL (ref 0.0–0.1)
Basophils Relative: 0 %
Eosinophils Absolute: 0.1 10*3/uL (ref 0.0–0.5)
Eosinophils Relative: 1 %
HCT: 35 % — ABNORMAL LOW (ref 39.0–52.0)
Hemoglobin: 12.1 g/dL — ABNORMAL LOW (ref 13.0–17.0)
Immature Granulocytes: 0 %
Lymphocytes Relative: 9 %
Lymphs Abs: 1.3 10*3/uL (ref 0.7–4.0)
MCH: 28.9 pg (ref 26.0–34.0)
MCHC: 34.6 g/dL (ref 30.0–36.0)
MCV: 83.7 fL (ref 80.0–100.0)
Monocytes Absolute: 1 10*3/uL (ref 0.1–1.0)
Monocytes Relative: 8 %
Neutro Abs: 11.1 10*3/uL — ABNORMAL HIGH (ref 1.7–7.7)
Neutrophils Relative %: 82 %
Platelets: 268 10*3/uL (ref 150–400)
RBC: 4.18 MIL/uL — ABNORMAL LOW (ref 4.22–5.81)
RDW: 15.3 % (ref 11.5–15.5)
WBC: 13.6 10*3/uL — ABNORMAL HIGH (ref 4.0–10.5)
nRBC: 0 % (ref 0.0–0.2)

## 2019-03-01 LAB — MAGNESIUM: Magnesium: 1.9 mg/dL (ref 1.7–2.4)

## 2019-03-01 LAB — SURGICAL PATHOLOGY

## 2019-03-01 MED ORDER — HYDRALAZINE HCL 20 MG/ML IJ SOLN
10.0000 mg | INTRAMUSCULAR | Status: DC | PRN
  Administered 2019-03-01 – 2019-03-03 (×2): 10 mg via INTRAVENOUS
  Filled 2019-03-01 (×2): qty 1

## 2019-03-01 MED ORDER — LABETALOL HCL 5 MG/ML IV SOLN
10.0000 mg | INTRAVENOUS | Status: DC | PRN
  Administered 2019-03-01: 10 mg via INTRAVENOUS
  Filled 2019-03-01 (×2): qty 4

## 2019-03-01 MED ORDER — PHENOL 1.4 % MT LIQD
1.0000 | OROMUCOSAL | Status: DC | PRN
  Administered 2019-03-02 – 2019-03-03 (×2): 1 via OROMUCOSAL
  Filled 2019-03-01: qty 177

## 2019-03-01 MED ORDER — OSMOLITE 1.5 CAL PO LIQD
1000.0000 mL | ORAL | Status: DC
  Administered 2019-03-01: 1000 mL

## 2019-03-01 NOTE — Progress Notes (Signed)
PROGRESS NOTE    Keith [name]  [id number] DOB: 1950/06/23 DOA: 02/23/2019  PCP: Patient, No Pcp Per    LOS - 6   Brief Narrative:  68 y.o.malewith medical history ofPUD, hypertension, a-fibnot on anticoagulationsecondary toPUD,presented withchest and epigastric pain. EGD backon 5/6/2020which showed LA grade D esophagitis, non-bleeding gastric ulcer, congested friable mucosa in the pylorus s/p epinephrine and large duodenal ulcer with duodenal stenosis.plan was for pt ot have repeat egd and he did not follow up.Patient reports attempts to be seen at [facility] but has been unable to get through to schedule. In the ED, HR 40, BP 100/76, otherwise vitals normal. Abdominal xray showed nonobstructive gas pattern. Patient admitted for further evaluation. Gastroenterology consulted. Patient underwent EGD 11/17 which showed pyloric stenosis and LA grade D reflux esophagitis without active bleeding. General surgery consulted and patient taken to surgery on 11/18 for antrectomy with Bilroth 1 reconstruction, vagotomy and jejunostomy feeding tube placement.   Subjective 11/20: Patient seen and examined, awake laying in bed.  He continues to report significant pain 9-10 out of 10 most of the time, does report improvement after pain medicine, but it wears off too quickly.  He denies fevers or chills, nausea vomiting, diarrhea.  No acute events reported overnight.  Assessment & Plan:   Principal Problem:   Acute epigastric pain Active Problems:   Atrial fibrillation (HCC)   Abdominal pain   Left-sided chest pain   Essential hypertension   Tobacco use disorder   Moderate protein-energy malnutrition (HCC)   Acute kidney injury superimposed on CKD (HCC)   Hypokalemia   Troponin level elevated   Dehydration   Acute gastritis without hemorrhage   Vitamin B12 deficiency   Intractable vomiting   Postpyloric ulcer   Acute epigastric pain/chest painsecondary to Gastric Outlet  Obstruction due to pyloric stenosis History of Peptic Ulcer Disease, chronic, stable History of Reflux Esophagitis, chronic, stable See above narrative for hospital course to date. Surgery done for pyloric stenosis 11/18.  - GI and general surgery following   NG tube, tube feeds, drain etc per surgery - continue Protonix 40 mg IV q12h - pain management post-op - mobilize patient, PT  Paroxysmal atrial fibrillation- rate controlled - Not on anticoagulationdue to PUD  -follow-up with cardiologyoutpatient  Hypokalemia- repleted Hypomagnesemia- repleted - monitor daily  - replete as needed for K>4.0 and Mg>2.0  Mildly elevated high-sensitivity troponin Troponin levels flattened.Not consistent withACS, more likelydemand ischemia. - cardiology recommends outpatient stress test  Abnormal thyroid function studies Will need repeat thyroid function studies done in about 4 to 6 weeks in the outpatient setting and if abnormal will need further evaluation by PCP.  Hypertension- chronic, stable Blood pressure elevated today. - continue home Norvasc  Tobacco abuse Tobacco cessation stressed to patient. Continue nicotine patch.  Acute kidney injury on chronic kidney disease Unclear baseline. Renal function improving with hydration. Urinalysis nitrite negative, leukocytes negative, negative for protein. Urine sodium was 21. Urine creatinine of 90. Avoid nephrotoxins. DecreaseddIV fluids to 75 cc/h. Follow.   Constipation Patient states that bowel movements yesterday after sorbitol. Continue current bowel regimen of MiraLAX daily and Senokot-S nightly.   Moderate protein calorie malnutrition  Vitamin B12 deficiency Vitamin B12 levels at 173.Continuevitamin B12 1000 MCG subcutaneously daily x1 week, and then weekly x1 month, and then monthly. Will need outpatient follow-up with PCP.   DVT prophylaxis: SCDs   Code Status: Full Code  Family  Communication: None at bedside Disposition Plan: Pending clinical improvement and clearance  by GI and surgery   Consultants:   General surgery  Gastroenterology  Procedures:   EGD 11/18  Surgery 11/19  Antimicrobials:   None   Objective: Vitals:   02/28/19 2321 03/01/19 0515 03/01/19 0636 03/01/19 0720  BP: (!) 152/93 (!) 175/109 (!) 172/103 (!) 186/101  Pulse: 64 66 62 62  Resp:  16  18  Temp:  98.5 F (36.9 C)  98.4 F (36.9 C)  TempSrc:  Oral  Oral  SpO2:  99%  96%  Weight:  58.1 kg    Height:        Intake/Output Summary (Last 24 hours) at 03/01/2019 0847 Last data filed at 03/01/2019 0700 Gross per 24 hour  Intake 966.23 ml  Output 4645 ml  Net -3678.77 ml   Filed Weights   02/27/19 0427 02/28/19 0440 03/01/19 0515  Weight: 60.8 kg 59.6 kg 58.1 kg    Examination:  General exam: awake, alert, no acute distress HEENT: Dry mucus membranes, hearing grossly normal, NG tube in place Respiratory system: Decreased breath sounds secondary to poor inspiratory effort which is limited by pain, no wheezes, rales or rhonchi. Cardiovascular system: normal S1/S2, RRR, no JVD, murmurs, rubs, gallops, no pedal edema.   Gastrointestinal system: soft, diffusely tender, non-distended abdomen, surgical incision with dressing and drain in place Extremities: moves all, no edema, normal tone Skin: dry, intact, normal temperature Psychiatry: normal mood, congruent affect   Data Reviewed: I have personally reviewed following labs and imaging studies  CBC: Recent Labs  Lab 02/24/19 0834 02/25/19 0408 02/26/19 0504 02/27/19 0602 02/28/19 0519 03/01/19 0500  WBC 8.3 6.4 6.3 10.7* 13.8* 13.6*  NEUTROABS 4.5  --   --   --   --  11.1*  HGB 11.8* 10.6* 11.5* 12.3* 11.8* 12.1*  HCT 34.5* 31.6* 33.7* 34.7* 33.5* 35.0*  MCV 84.4 84.7 83.8 80.9 83.5 83.7  PLT 250 241 256 273 258 [id number]   Basic Metabolic Panel: Recent Labs  Lab 02/25/19 0408 02/26/19 0504 02/27/19 0602  02/28/19 0519 03/01/19 0500  NA 135 135 135 132* 133*  K 4.0 3.9 4.2 4.3 4.1  CL 106 106 105 102 103  CO2 23 24 22  19* 21*  GLUCOSE 85 86 77 105* 116*  BUN 16 7* 7* 11 9  CREATININE 1.26* 1.16 1.18 1.26* 1.04  CALCIUM 7.8* 8.1* 8.6* 8.3* 8.7*  MG 1.8 1.6* 2.1 1.8 1.9  PHOS  --   --  3.7 3.3  --    GFR: Estimated Creatinine Clearance: 55.9 mL/min (by C-G formula based on SCr of 1.04 mg/dL). Liver Function Tests: Recent Labs  Lab 02/23/19 1414 02/24/19 0611  AST 25 16  ALT 11 8  ALKPHOS 79 54  BILITOT 0.7 0.8  PROT 8.7* 6.2*  ALBUMIN 4.7 3.2*   Recent Labs  Lab 02/23/19 1414 02/24/19 0834  LIPASE 44 46   No results for input(s): AMMONIA in the last 168 hours. Coagulation Profile: No results for input(s): INR, PROTIME in the last 168 hours. Cardiac Enzymes: Recent Labs  Lab 02/24/19 0611  CKTOTAL 138   BNP (last 3 results) No results for input(s): PROBNP in the last 8760 hours. HbA1C: No results for input(s): HGBA1C in the last 72 hours. CBG: No results for input(s): GLUCAP in the last 168 hours. Lipid Profile: No results for input(s): CHOL, HDL, LDLCALC, TRIG, CHOLHDL, LDLDIRECT in the last 72 hours. Thyroid Function Tests: No results for input(s): TSH, T4TOTAL, FREET4, T3FREE, THYROIDAB in the last 72 hours.  Anemia Panel: No results for input(s): VITAMINB12, FOLATE, FERRITIN, TIBC, IRON, RETICCTPCT in the last 72 hours. Sepsis Labs: Recent Labs  Lab 02/23/19 2051 02/23/19 2142  LATICACIDVEN 1.0 1.2    Recent Results (from the past 240 hour(s))  SARS CORONAVIRUS 2 (TAT 6-24 HRS) Nasopharyngeal Nasopharyngeal Swab     Status: None   Collection Time: 02/23/19  4:30 PM   Specimen: Nasopharyngeal Swab  Result Value Ref Range Status   SARS Coronavirus 2 NEGATIVE NEGATIVE Final    Comment: (NOTE) SARS-CoV-2 target nucleic acids are NOT DETECTED. The SARS-CoV-2 RNA is generally detectable in upper and lower respiratory specimens during the acute phase of  infection. Negative results do not preclude SARS-CoV-2 infection, do not rule out co-infections with other pathogens, and should not be used as the sole basis for treatment or other patient management decisions. Negative results must be combined with clinical observations, patient history, and epidemiological information. The expected result is Negative. Fact Sheet for Patients: SugarRoll.be Fact Sheet for Healthcare Providers: https://www.woods-mathews.com/ This test is not yet approved or cleared by the [place] FDA and  has been authorized for detection and/or diagnosis of SARS-CoV-2 by FDA under an Emergency Use Authorization (EUA). This EUA will remain  in effect (meaning this test can be used) for the duration of the COVID-19 declaration under Section 56 4(b)(1) of the Act, 21 U.S.C. section 360bbb-3(b)(1), unless the authorization is terminated or revoked sooner. Performed at [facility], [place] [address] [place], [place] [address]   Urine culture     Status: Abnormal   Collection Time: 02/24/19 10:17 AM   Specimen: Urine, Catheterized  Result Value Ref Range Status   Specimen Description   Final    URINE, CATHETERIZED Performed at [facility], [address] [place], [place] [address]    Special Requests   Final    NONE Performed at [facility], [address] [place], [place] [address]    Culture (A)  Final    <10,000 COLONIES/mL INSIGNIFICANT GROWTH Performed at [facility], [place] [address], [place], [place] [address]    Report Status 02/25/2019 FINAL  Final         Radiology Studies: No results found.      Scheduled Meds: . amLODipine  10 mg Oral Daily  . Chlorhexidine Gluconate Cloth  6 each Topical Daily  . cyanocobalamin  1,000 mcg Subcutaneous Daily  . feeding supplement (PRO-STAT SUGAR FREE 64)  30 mL Per Tube Daily  . free water  30 mL Per Tube Q4H  . hydrocerin    Topical BID  . nicotine  14 mg Transdermal Daily  . pantoprazole (PROTONIX) IV  40 mg Intravenous Q12H  . triamcinolone cream   Topical TID   Continuous Infusions: . feeding supplement (OSMOLITE 1.5 CAL) 30 mL/hr at 02/28/19 1900  . lactated ringers 75 mL/hr at 03/01/19 0009     LOS: 6 days    Time spent: 30-35 minutes    Ezekiel Slocumb, DO [facility] Pager: [phone]  If 7PM-7AM, please contact night-coverage [email or web address] Password [facility] 03/01/2019, 8:47 AM

## 2019-03-01 NOTE — Plan of Care (Signed)
  Problem: Education: Goal: Knowledge of General Education information will improve Description: Including pain rating scale, medication(s)/side effects and non-pharmacologic comfort measures Outcome: Progressing   Problem: Clinical Measurements: Goal: Will remain free from infection Outcome: Progressing   Problem: Activity: Goal: Risk for activity intolerance will decrease Outcome: Progressing   Problem: Pain Managment: Goal: General experience of comfort will improve Outcome: Not Progressing   Problem: Safety: Goal: Ability to remain free from injury will improve Outcome: Not Progressing  Refusing bed alarm

## 2019-03-01 NOTE — Progress Notes (Signed)
[facility] Hospital Day(s): 6.   Post op day(s): 2 Days Post-Op.   Interval History:  Patient seen and examined no acute events or new complaints overnight.  Patient reports he continues to have incisional soreness worse with movement.  No fever, chills, nausea, or emesis.  He is passing flatus but no BM On tube feedings, tolerating, advancing to goal NGT - 475 Blake drain - 70, serosanguinous Strict NPO to allow anastomosis to heal  Vital signs in last 24 hours: [min-max] current  Temp:  [97.8 F (36.6 C)-98.5 F (36.9 C)] 98.4 F (36.9 C) (11/20 0720) Pulse Rate:  [60-91] 62 (11/20 0720) Resp:  [16-18] 18 (11/20 0720) BP: (131-186)/(81-109) 186/101 (11/20 0720) SpO2:  [96 %-100 %] 96 % (11/20 0720) Weight:  [58.1 kg] 58.1 kg (11/20 0515)     Height: 5\' 7"  (170.2 cm) Weight: 58.1 kg BMI (Calculated): 20.06   Intake/Output last 2 shifts:  11/19 0701 - 11/20 0700 In: -  Out: [id number] [Urine:4200; Emesis/NG output:475; Drains:70]   Physical Exam:  Constitutional: alert, cooperative and no distress  HEENT: NGT in place Respiratory: breathing non-labored at rest  Cardiovascular: regular rate and sinus rhythm  Gastrointestinal: soft, expected incisional soreness, and non-distended. JP present with serosanguinous output. Jejunostomy in place, receiving tube feedings Integumentary: Laparotomy incision is CDI with staples and honeycomb dressing   Labs:  CBC Latest Ref Rng & Units 03/01/2019 02/28/2019 02/27/2019  WBC 4.0 - 10.5 K/uL 13.6(H) 13.8(H) 10.7(H)  Hemoglobin 13.0 - 17.0 g/dL 12.1(L) 11.8(L) 12.3(L)  Hematocrit 39.0 - 52.0 % 35.0(L) 33.5(L) 34.7(L)  Platelets 150 - 400 K/uL 268 258 273   CMP Latest Ref Rng & Units 03/01/2019 02/28/2019 02/27/2019  Glucose 70 - 99 mg/dL 116(H) 105(H) 77  BUN 8 - 23 mg/dL 9 11 7(L)  Creatinine 0.61 - 1.24 mg/dL 1.04 1.26(H) 1.18  Sodium 135 - 145 mmol/L 133(L) 132(L) 135  Potassium 3.5 - 5.1  mmol/L 4.1 4.3 4.2  Chloride 98 - 111 mmol/L 103 102 105  CO2 22 - 32 mmol/L 21(L) 19(L) 22  Calcium 8.9 - 10.3 mg/dL 8.7(L) 8.3(L) 8.6(L)  Total Protein 6.5 - 8.1 g/dL - - -  Total Bilirubin 0.3 - 1.2 mg/dL - - -  Alkaline Phos 38 - 126 U/L - - -  AST 15 - 41 U/L - - -  ALT 0 - 44 U/L - - -     Imaging studies: No new pertinent imaging studies   Assessment/Plan:  68 y.o. male with expected post-surgical soreness otherwise doing okay 2 Days Post-Op s/p antrectomy with Bilroth 1 reconstruction, vagotomy, and feeding jejunostomy tube placement for GOO and PUD.   - Continue STRICT NPO + IVF             - Continue NGT decompression; he will need to be studied ~ 48 post-op to assess repair / anastomosis; this will likely happen Monday 11/23             - Continue tube feedings: advance as tolerates to goal             - pain control prn; antiemetics prn             - monitor abdominal examination             - continue Blake drain; monitor output             - mobilization encouraged; PT following             -  further management per primary service    All of the above findings and recommendations were discussed with the patient, and the medical team, and all of patient's questions were answered to his expressed satisfaction.  -- Edison Simon, PA-C [facility] 03/01/2019, 7:36 AM [phone] M-F: 7am - 4pm

## 2019-03-02 LAB — BASIC METABOLIC PANEL
Anion gap: 10 (ref 5–15)
BUN: 12 mg/dL (ref 8–23)
CO2: 19 mmol/L — ABNORMAL LOW (ref 22–32)
Calcium: 8.7 mg/dL — ABNORMAL LOW (ref 8.9–10.3)
Chloride: 104 mmol/L (ref 98–111)
Creatinine, Ser: 0.96 mg/dL (ref 0.61–1.24)
GFR calc Af Amer: >60 mL/min (ref >60)
GFR calc non Af Amer: >60 mL/min (ref >60)
Glucose, Bld: 131 mg/dL — ABNORMAL HIGH (ref 70–99)
Potassium: 3.9 mmol/L (ref 3.5–5.1)
Sodium: 133 mmol/L — ABNORMAL LOW (ref 135–145)

## 2019-03-02 LAB — CBC
HCT: 33.1 % — ABNORMAL LOW (ref 39.0–52.0)
Hemoglobin: 12.1 g/dL — ABNORMAL LOW (ref 13.0–17.0)
MCH: 29.4 pg (ref 26.0–34.0)
MCHC: 36.6 g/dL — ABNORMAL HIGH (ref 30.0–36.0)
MCV: 80.3 fL (ref 80.0–100.0)
Platelets: 286 10*3/uL (ref 150–400)
RBC: 4.12 MIL/uL — ABNORMAL LOW (ref 4.22–5.81)
RDW: 15.4 % (ref 11.5–15.5)
WBC: 11.8 10*3/uL — ABNORMAL HIGH (ref 4.0–10.5)
nRBC: 0 % (ref 0.0–0.2)

## 2019-03-02 LAB — MAGNESIUM: Magnesium: 2.1 mg/dL (ref 1.7–2.4)

## 2019-03-02 MED ORDER — OSMOLITE 1.5 CAL PO LIQD: 1000.0000 mL | ORAL | Status: DC

## 2019-03-02 MED ORDER — PANCRELIPASE (LIP-PROT-AMYL) 10440-39150 UNITS PO TABS
20880.0000 [IU] | ORAL_TABLET | Freq: Once | ORAL | Status: DC
  Filled 2019-03-02: qty 2

## 2019-03-02 MED ORDER — HYDROMORPHONE HCL 1 MG/ML IJ SOLN
1.0000 mg | INTRAMUSCULAR | Status: DC | PRN
  Administered 2019-03-03: 1 mg via INTRAVENOUS
  Filled 2019-03-02: qty 1

## 2019-03-02 MED ORDER — ACETAMINOPHEN 10 MG/ML IV SOLN
1000.0000 mg | Freq: Four times a day (QID) | INTRAVENOUS | Status: AC
  Administered 2019-03-02 – 2019-03-03 (×4): 1000 mg via INTRAVENOUS
  Filled 2019-03-02 (×4): qty 100

## 2019-03-02 MED ORDER — SODIUM BICARBONATE 650 MG PO TABS
650.0000 mg | ORAL_TABLET | Freq: Once | ORAL | Status: DC
  Filled 2019-03-02: qty 1

## 2019-03-02 NOTE — Progress Notes (Signed)
Still waiting for the arrival of med r/t J tube declotting protocol. Once arrives will administer and hopefully restart TF's. Wenda Low [facility]

## 2019-03-02 NOTE — Progress Notes (Signed)
TF tube completely occluded. Attempted multiple times to flush, unsuccessful. TF pump stopped. Will inform surgery. Sutures to site remain intact. Charge nurse informed. Awaiting response / call back. Wenda Low [facility]

## 2019-03-02 NOTE — Plan of Care (Signed)
  Problem: Education: Goal: Knowledge of General Education information will improve Description: Including pain rating scale, medication(s)/side effects and non-pharmacologic comfort measures Outcome: Progressing   Problem: Health Behavior/Discharge Planning: Goal: Ability to manage health-related needs will improve Outcome: Not Progressing Note: J tube occluded. Will attempt to declot once medication received to do so. Still waiting on pharmacy to send. TF's to be restarted afterward. Will continue to monitor GI status. Wenda Low [facility]

## 2019-03-02 NOTE — Progress Notes (Signed)
POD # 3 BI Jejunostomy tube clogged today, he has been tolerating TF well AVSS Pain improving JP serous NGT 700cc  PE NAD Abd: incision c/d/i, no infection, middle 5 mm area w serous drainage, no peritonitis. JP serous. Jejunostomy in place  A/P Doing well May do enzymes to unclogged tube May do oral meds w sips for now and only do TF via jejunostomy tube Upper GI series Monday until then keep NGT

## 2019-03-02 NOTE — Progress Notes (Signed)
PROGRESS NOTE    Keith [name]  [id number] DOB: 02-27-1951 DOA: 02/23/2019  PCP: Patient, No Pcp Per    LOS - 7   Brief Narrative:  68 y.o.malewith medical history ofPUD, hypertension, a-fibnot on anticoagulationsecondary toPUD,presented withchest and epigastric pain. EGD backon 5/6/2020which showed LA grade D esophagitis, non-bleeding gastric ulcer, congested friable mucosa in the pylorus s/p epinephrine and large duodenal ulcer with duodenal stenosis.plan was for pt ot have repeat egd and he did not follow up.Patient reports attempts to be seen at [facility] but has been unable to get through to schedule. In the ED, HR 40, BP 100/76, otherwise vitals normal. Abdominal xray showed nonobstructive gas pattern. Patient admitted for further evaluation. Gastroenterology consulted. Patient underwent EGD 11/17 which showed pyloric stenosis and LA grade D reflux esophagitis without active bleeding. General surgery consulted and patient taken to surgery on 11/18for antrectomy with Bilroth 1 reconstruction, vagotomy and jejunostomy feeding tube placement.  Subjective 11/21: Patient awake lying in bed resting examined this morning.  Continues to report 10 of 10 abdominal pain postop.  Currently writhing in bed and wincing.  Does stated his pain decreases to a 2 out of 10 after medications given.  Has had some throat irritation, states Chloraseptic spray helps.  Denies fevers or chills, nausea vomiting or diarrhea, chest pain or shortness of breath.  Assessment & Plan:   Principal Problem:   Acute epigastric pain Active Problems:   Atrial fibrillation (HCC)   Abdominal pain   Left-sided chest pain   Essential hypertension   Tobacco use disorder   Moderate protein-energy malnutrition (HCC)   Acute kidney injury superimposed on CKD (HCC)   Hypokalemia   Troponin level elevated   Dehydration   Acute gastritis without hemorrhage   Vitamin B12 deficiency   Intractable vomiting   Postpyloric ulcer  Acute epigastric pain/chest painsecondary to Gastric Outlet Obstruction due to pyloric stenosis History of Peptic Ulcer Disease, chronic, stable History of Reflux Esophagitis, chronic, stable See above narrative for hospital course to date. Surgery done for pyloric stenosis 11/18.  - GI and general surgery following NG tube, tube feeds, drain etc per surgery - continue Protonix 40 mg IV q12h - pain management post-op - mobilize patient, PT  Paroxysmal atrial fibrillation- rate controlled - Not on anticoagulationdue to PUD  -follow-up with cardiologyoutpatient  Hypokalemia- repleted Hypomagnesemia- repleted - monitor daily  - replete as needed for K>4.0 and Mg>2.0  Mildly elevated high-sensitivity troponin Troponin levels flattened.Not consistent withACS, more likelydemand ischemia. - cardiology recommends outpatient stress test  Abnormal thyroid function studies Will need repeat thyroid function studies done in about 4 to 6 weeks in the outpatient setting and if abnormal will need further evaluation by PCP.  Hypertension- chronic, stable Blood pressure elevated due to postop pain. - continue home Norvasc -Hydralazine or labetalol IV as needed  Tobacco abuse Tobacco cessation stressed to patient. Continue nicotine patch.  Acute kidney injury on chronic kidney disease Unclear baseline. Renal function improving with hydration. Urinalysis nitrite negative, leukocytes negative, negative for protein. Urine sodium was 21. Urine creatinine of 90. Avoid nephrotoxins. DecreaseddIV fluids to 75 cc/h. Follow.   Constipation Patient states that bowel movements yesterday after sorbitol. Continue current bowel regimen of MiraLAX daily and Senokot-S nightly.   Moderate protein calorie malnutrition  Vitamin B12 deficiency Vitamin B12 levels at 173.Continuevitamin B12 1000 MCG subcutaneously daily x1 week, and then weekly x1  month, and then monthly. Will need outpatient follow-up with PCP.   DVT prophylaxis: SCDs  Code Status: Full Code  Family Communication: None at bedside Disposition Plan: Pending clinical improvement and clearance by GI and surgery   Consultants:   General surgery  Gastroenterology  Procedures:   EGD 11/18  Surgery 11/19  Antimicrobials:   None      Objective: Vitals:   03/01/19 1656 03/01/19 1939 03/02/19 0400 03/02/19 0402  BP: (!) 160/114 (!) 160/118 (!) 149/111 (!) 163/98  Pulse: 76 77 65 69  Resp: 19     Temp: 98.3 F (36.8 C) 98.1 F (36.7 C) 98.2 F (36.8 C)   TempSrc:  Oral Oral   SpO2: 95% 98% 97% 96%  Weight:      Height:        Intake/Output Summary (Last 24 hours) at 03/02/2019 0836 Last data filed at 03/02/2019 0646 Gross per 24 hour  Intake 2166 ml  Output 2060 ml  Net 106 ml   Filed Weights   02/27/19 0427 02/28/19 0440 03/01/19 0515  Weight: 60.8 kg 59.6 kg 58.1 kg    Examination:  General exam: awake, alert, no acute distress HEENT: NG tube present, dry mucus membranes, hearing grossly normal  Respiratory system: clear to auscultation bilaterally, no wheezes, rales or rhonchi, normal respiratory effort. Cardiovascular system: normal S1/S2, RRR, no JVD, murmurs, rubs, gallops, no pedal edema.   Gastrointestinal system: Soft, diffusely tender, non-distended abdomen, normal bowel sounds. Central nervous system: alert and oriented x4. no gross focal neurologic deficits, normal speech Extremities: moves all, no edema, normal tone Psychiatry: normal mood, congruent affect, judgement and insight appear normal    Data Reviewed: I have personally reviewed following labs and imaging studies  CBC: Recent Labs  Lab 02/24/19 0834  02/26/19 0504 02/27/19 0602 02/28/19 0519 03/01/19 0500 03/02/19 0506  WBC 8.3   < > 6.3 10.7* 13.8* 13.6* 11.8*  NEUTROABS 4.5  --   --   --   --  11.1*  --   HGB 11.8*   < > 11.5* 12.3* 11.8*  12.1* 12.1*  HCT 34.5*   < > 33.7* 34.7* 33.5* 35.0* 33.1*  MCV 84.4   < > 83.8 80.9 83.5 83.7 80.3  PLT 250   < > 256 273 258 268 286   < > = values in this interval not displayed.   Basic Metabolic Panel: Recent Labs  Lab 02/26/19 0504 02/27/19 0602 02/28/19 0519 03/01/19 0500 03/02/19 0506  NA 135 135 132* 133* 133*  K 3.9 4.2 4.3 4.1 3.9  CL 106 105 102 103 104  CO2 24 22 19* 21* 19*  GLUCOSE 86 77 105* 116* 131*  BUN 7* 7* 11 9 12   CREATININE 1.16 1.18 1.26* 1.04 0.96  CALCIUM 8.1* 8.6* 8.3* 8.7* 8.7*  MG 1.6* 2.1 1.8 1.9 2.1  PHOS  --  3.7 3.3  --   --    GFR: Estimated Creatinine Clearance: 60.5 mL/min (by C-G formula based on SCr of 0.96 mg/dL). Liver Function Tests: Recent Labs  Lab 02/23/19 1414 02/24/19 0611  AST 25 16  ALT 11 8  ALKPHOS 79 54  BILITOT 0.7 0.8  PROT 8.7* 6.2*  ALBUMIN 4.7 3.2*   Recent Labs  Lab 02/23/19 1414 02/24/19 0834  LIPASE 44 46   No results for input(s): AMMONIA in the last 168 hours. Coagulation Profile: No results for input(s): INR, PROTIME in the last 168 hours. Cardiac Enzymes: Recent Labs  Lab 02/24/19 0611  CKTOTAL 138   BNP (last 3 results) No results for input(s): PROBNP  in the last 8760 hours. HbA1C: No results for input(s): HGBA1C in the last 72 hours. CBG: No results for input(s): GLUCAP in the last 168 hours. Lipid Profile: No results for input(s): CHOL, HDL, LDLCALC, TRIG, CHOLHDL, LDLDIRECT in the last 72 hours. Thyroid Function Tests: No results for input(s): TSH, T4TOTAL, FREET4, T3FREE, THYROIDAB in the last 72 hours. Anemia Panel: No results for input(s): VITAMINB12, FOLATE, FERRITIN, TIBC, IRON, RETICCTPCT in the last 72 hours. Sepsis Labs: Recent Labs  Lab 02/23/19 2051 02/23/19 2142  LATICACIDVEN 1.0 1.2    Recent Results (from the past 240 hour(s))  SARS CORONAVIRUS 2 (TAT 6-24 HRS) Nasopharyngeal Nasopharyngeal Swab     Status: None   Collection Time: 02/23/19  4:30 PM   Specimen:  Nasopharyngeal Swab  Result Value Ref Range Status   SARS Coronavirus 2 NEGATIVE NEGATIVE Final    Comment: (NOTE) SARS-CoV-2 target nucleic acids are NOT DETECTED. The SARS-CoV-2 RNA is generally detectable in upper and lower respiratory specimens during the acute phase of infection. Negative results do not preclude SARS-CoV-2 infection, do not rule out co-infections with other pathogens, and should not be used as the sole basis for treatment or other patient management decisions. Negative results must be combined with clinical observations, patient history, and epidemiological information. The expected result is Negative. Fact Sheet for Patients: SugarRoll.be Fact Sheet for Healthcare Providers: https://www.woods-mathews.com/ This test is not yet approved or cleared by the [place] FDA and  has been authorized for detection and/or diagnosis of SARS-CoV-2 by FDA under an Emergency Use Authorization (EUA). This EUA will remain  in effect (meaning this test can be used) for the duration of the COVID-19 declaration under Section 56 4(b)(1) of the Act, 21 U.S.C. section 360bbb-3(b)(1), unless the authorization is terminated or revoked sooner. Performed at [facility], [place] [address] [place], [place] [address]   Urine culture     Status: Abnormal   Collection Time: 02/24/19 10:17 AM   Specimen: Urine, Catheterized  Result Value Ref Range Status   Specimen Description   Final    URINE, CATHETERIZED Performed at [facility], [address] [place], [place] [address]    Special Requests   Final    NONE Performed at [facility], [address] [place], [place] [address]    Culture (A)  Final    <10,000 COLONIES/mL INSIGNIFICANT GROWTH Performed at [facility], [place] [address] [place], [place] [address]    Report Status 02/25/2019 FINAL  Final         Radiology Studies: No results found.       Scheduled Meds: . amLODipine  10 mg Oral Daily  . Chlorhexidine Gluconate Cloth  6 each Topical Daily  . cyanocobalamin  1,000 mcg Subcutaneous Daily  . feeding supplement (PRO-STAT SUGAR FREE 64)  30 mL Per Tube Daily  . free water  30 mL Per Tube Q4H  . hydrocerin   Topical BID  . nicotine  14 mg Transdermal Daily  . pantoprazole (PROTONIX) IV  40 mg Intravenous Q12H  . triamcinolone cream   Topical TID   Continuous Infusions: . feeding supplement (OSMOLITE 1.5 CAL) 1,000 mL (03/01/19 1400)  . lactated ringers 75 mL/hr at 03/02/19 0646     LOS: 7 days    Time spent: 25-30 minutes    Ezekiel Slocumb, DO [facility] Pager: [phone]  If 7PM-7AM, please contact night-coverage [email or web address] Password [email or web address] 03/02/2019, 8:36 AM

## 2019-03-02 NOTE — Progress Notes (Signed)
Physical Therapy Treatment Patient Details Name: Keith [name] MRN: [id number] DOB: 1950/10/11 Today's Date: 03/02/2019    History of Present Illness presented to ER and admitted for management of chest, epigastric pain; noted with gastric outlet obstruction.  Status post gastrectomy, vagotomy and feeding jejunostomy (11/18).    PT Comments    Pt in bed but ready to get up and walk.  Pt somewhat impulsive trying to get up before I could manage all tubes and drains.  RN called and in to unhook suction etc in prep for gait.  Feeding tube popping out several times during transition.  RN called in to check as it did not seem to be right.  Pt was however, able to stand and walk complete lap around unit pushing IV pole and holding handrail intermittently with gait.  Pt with flexed posture at times but overall did well with self initiated rests as needed.  Returned to bed with RN in to address feeding tube.  Pt progressing well with gait and mobility.  SNF recommended initially but he may be able to progress to HHPT depending on medical needs.   Follow Up Recommendations  SNF     Equipment Recommendations  Rolling walker with 5" wheels;3in1 (PT)    Recommendations for Other Services       Precautions / Restrictions Precautions Precautions: Fall Precaution Comments: NG, feeding tube and drain Restrictions Weight Bearing Restrictions: No    Mobility  Bed Mobility Overal bed mobility: Needs Assistance Bed Mobility: Supine to Sit     Supine to sit: Min assist        Transfers Overall transfer level: Needs assistance Equipment used: 1 person hand held assist Transfers: Sit to/from Stand Sit to Stand: Min assist;Min guard            Ambulation/Gait Ambulation/Gait assistance: Min guard Gait Distance (Feet): 160 Feet Assistive device: IV Pole;1 person hand held assist Gait Pattern/deviations: Step-through pattern;Trunk flexed     General Gait Details: slow gait with  self initiated reste breaks, pushes himself to complete lap   Stairs             Wheelchair Mobility    Modified Rankin (Stroke Patients Only)       Balance Overall balance assessment: Needs assistance Sitting-balance support: Feet supported Sitting balance-Leahy Scale: Good     Standing balance support: Single extremity supported Standing balance-Leahy Scale: Fair                              Cognition Arousal/Alertness: Awake/alert Behavior During Therapy: WFL for tasks assessed/performed;Impulsive Overall Cognitive Status: Within Functional Limits for tasks assessed                                 General Comments: somewhat impulsive trying to get out of bed before I was ready managing tubes and epuipment.      Exercises      General Comments        Pertinent Vitals/Pain Pain Assessment: Faces Faces Pain Scale: Hurts whole lot Pain Location: abdomen Pain Descriptors / Indicators: Aching;Guarding Pain Intervention(s): Limited activity within patient's tolerance;Monitored during session    Home Living                      Prior Function            PT Goals (  current goals can now be found in the care plan section) Progress towards PT goals: Progressing toward goals    Frequency    Min 2X/week      PT Plan Current plan remains appropriate;Other (comment)    Co-evaluation              AM-PAC PT "6 Clicks" Mobility   Outcome Measure  Help needed turning from your back to your side while in a flat bed without using bedrails?: A Little Help needed moving from lying on your back to sitting on the side of a flat bed without using bedrails?: A Little Help needed moving to and from a bed to a chair (including a wheelchair)?: A Little Help needed standing up from a chair using your arms (e.g., wheelchair or bedside chair)?: A Little Help needed to walk in hospital room?: A Little Help needed climbing 3-5  steps with a railing? : A Little 6 Click Score: 18    End of Session Equipment Utilized During Treatment: Gait belt Activity Tolerance: Patient tolerated treatment well;Patient limited by pain Patient left: in bed;with call bell/phone within reach;with bed alarm set;with nursing/sitter in room Nurse Communication: Other (comment)       Time: [id number] PT Time Calculation (min) (ACUTE ONLY): 19 min  Charges:  $Gait Training: 8-22 mins                    Chesley Noon, PTA 03/02/19, 11:30 AM

## 2019-03-02 NOTE — Progress Notes (Addendum)
Declotting of J tube failed. Clot so excessive cannot infuse enzyme through the tube. Charge nurse aware. Informed Dr. Dahlia Byes via personal cell phone message of above. Requested call back to the floor. Awaiting call back at this time. Keith [name]   Spoke to surgeon, explained situation. Ordered to leave everything paused and as is for now. Will continue to monitor. Can give medication orally. Keith [name] [facility]

## 2019-03-03 LAB — BASIC METABOLIC PANEL
Anion gap: 10 (ref 5–15)
BUN: 14 mg/dL (ref 8–23)
CO2: 20 mmol/L — ABNORMAL LOW (ref 22–32)
Calcium: 8.5 mg/dL — ABNORMAL LOW (ref 8.9–10.3)
Chloride: 102 mmol/L (ref 98–111)
Creatinine, Ser: 0.95 mg/dL (ref 0.61–1.24)
GFR calc Af Amer: >60 mL/min (ref >60)
GFR calc non Af Amer: >60 mL/min (ref >60)
Glucose, Bld: 77 mg/dL (ref 70–99)
Potassium: 4.3 mmol/L (ref 3.5–5.1)
Sodium: 132 mmol/L — ABNORMAL LOW (ref 135–145)

## 2019-03-03 LAB — CBC
HCT: 33.2 % — ABNORMAL LOW (ref 39.0–52.0)
Hemoglobin: 11.4 g/dL — ABNORMAL LOW (ref 13.0–17.0)
MCH: 29 pg (ref 26.0–34.0)
MCHC: 34.3 g/dL (ref 30.0–36.0)
MCV: 84.5 fL (ref 80.0–100.0)
Platelets: 318 10*3/uL (ref 150–400)
RBC: 3.93 MIL/uL — ABNORMAL LOW (ref 4.22–5.81)
RDW: 15.3 % (ref 11.5–15.5)
WBC: 11.1 10*3/uL — ABNORMAL HIGH (ref 4.0–10.5)
nRBC: 0 % (ref 0.0–0.2)

## 2019-03-03 LAB — PHOSPHORUS: Phosphorus: 4.5 mg/dL (ref 2.5–4.6)

## 2019-03-03 LAB — MAGNESIUM: Magnesium: 2 mg/dL (ref 1.7–2.4)

## 2019-03-03 MED ORDER — DEXTROSE-NACL 5-0.9 % IV SOLN
INTRAVENOUS | Status: DC
  Administered 2019-03-03 – 2019-03-06 (×8): via INTRAVENOUS

## 2019-03-03 NOTE — Progress Notes (Addendum)
Pt osmolite was not running on change of shift. As per day time nurse Osmolite was paused due to clogged issue and states surgeon will assess pt in the morning. Will notify incoming shift. Will continue to monitor.

## 2019-03-03 NOTE — Plan of Care (Signed)
  Problem: Education: Goal: Knowledge of General Education information will improve Description: Including pain rating scale, medication(s)/side effects and non-pharmacologic comfort measures Outcome: Progressing   Problem: Pain Managment: Goal: General experience of comfort will improve Outcome: Progressing   Problem: Safety: Goal: Ability to remain free from injury will improve Outcome: Progressing   

## 2019-03-03 NOTE — Progress Notes (Signed)
PROGRESS NOTE    Keith [name]  [id number] DOB: 21-Apr-1950 DOA: 02/23/2019  PCP: Patient, No Pcp Per    LOS - 8   Brief Narrative:  68 y.o.malewith medical history ofPUD, hypertension, a-fibnot on anticoagulationsecondary toPUD,presented withchest and epigastric pain. EGD backon 5/6/2020which showed LA grade D esophagitis, non-bleeding gastric ulcer, congested friable mucosa in the pylorus s/p epinephrine and large duodenal ulcer with duodenal stenosis.plan was for pt ot have repeat egd and he did not follow up.Patient reports attempts to be seen at [facility] but has been unable to get through to schedule. In the ED, HR 40, BP 100/76, otherwise vitals normal. Abdominal xray showed nonobstructive gas pattern. Patient admitted for further evaluation. Gastroenterology consulted. Patient underwent EGD 11/17 which showed pyloric stenosis and LA grade D reflux esophagitis without active bleeding. General surgery consulted and patient taken to surgery on 11/18for antrectomy with Bilroth 1 reconstruction, vagotomy and jejunostomy feeding tube placement.  Subjective 11/22: Patient sleeping comfortably, woke easily.  States pain slightly better today.  As we talk, pain seems to progress, patient writhes and grips bed rails.  Denies fever/chills, N/V/D.  Does report some phlegm that he cannot cough up due to pain, states he uses the NG suction.  States he is anxious to return to his new apartment, he'd only lived there one day before this admission.  Assessment & Plan:   Principal Problem:   Acute epigastric pain Active Problems:   Atrial fibrillation (HCC)   Abdominal pain   Left-sided chest pain   Essential hypertension   Tobacco use disorder   Moderate protein-energy malnutrition (HCC)   Acute kidney injury superimposed on CKD (HCC)   Hypokalemia   Troponin level elevated   Dehydration   Acute gastritis without hemorrhage   Vitamin B12 deficiency   Intractable vomiting    Postpyloric ulcer   Acute epigastric pain/chest painsecondary to Gastric Outlet Obstruction due to pyloric stenosis History of Peptic Ulcer Disease, chronic, stable History of Reflux Esophagitis, chronic, stable See above narrative for hospital course to date. Surgery done for pyloric stenosis 11/18.  - GI and general surgery following NG tube, tube feeds, drain etc per surgery   J tube clogged yesterday, surgery to address today - continue Protonix 40 mg IV q12h - pain management post-op - mobilize patient, PT  Paroxysmal atrial fibrillation- rate controlled - Not on anticoagulationdue to PUD  -follow-up with cardiologyoutpatient  Hypokalemia- repleted Hypomagnesemia- repleted - monitor daily  - replete as needed for K>4.0 and Mg>2.0  Mildly elevated high-sensitivity troponin Troponin levels flattened.Not consistent withACS, more likelydemand ischemia. - cardiology recommends outpatient stress test  Abnormal thyroid function studies Will need repeat thyroid function studies done in about 4 to 6 weeks in the outpatient setting and if abnormal will need further evaluation by PCP.  Hypertension- chronic, stable Blood pressure continues to be elevated, likely due to postop pain. - continue home Norvasc - Hydralazine or labetalol IV as needed - med history appears not given very often, requested RN to use as needed / ordered for better control  Tobacco abuse Tobacco cessation stressed to patient. Continue nicotine patch.  Acute kidney injury on chronic kidney disease Unclear baseline. Renal function improving with hydration. Urinalysis nitrite negative, leukocytes negative, negative for protein. Urine sodium was 21. Urine creatinine of 90. Avoid nephrotoxins. DecreaseddIV fluids to 75 cc/h. Follow.   Constipation Patient states that bowel movements yesterday after sorbitol. Continue current bowel regimen of MiraLAX daily and Senokot-S  nightly.   Moderate  protein calorie malnutrition  Vitamin B12 deficiency Vitamin B12 levels at 173.Continuevitamin B12 1000 MCG subcutaneously daily x1 week, and then weekly x1 month, and then monthly. Will need outpatient follow-up with PCP.   DVT prophylaxis:SCDs Code Status: Full Code Family Communication:None at bedside Disposition Plan:Pending clinical improvement and clearance by GI and surgery   Consultants:  General surgery  Gastroenterology  Procedures:  EGD 11/18  Surgery 11/19  Antimicrobials:  None   Objective: Vitals:   03/03/19 0210 03/03/19 0525 03/03/19 0548 03/03/19 0731  BP: (!) 154/98 (!) 149/100  (!) 162/100  Pulse: 64 72  68  Resp:  18  16  Temp: 98.4 F (36.9 C) 97.8 F (36.6 C)  98.2 F (36.8 C)  TempSrc: Oral Oral  Oral  SpO2: 98% 96%  98%  Weight:   56.8 kg   Height:        Intake/Output Summary (Last 24 hours) at 03/03/2019 [id number] Last data filed at 03/03/2019 0612 Gross per 24 hour  Intake 835.89 ml  Output 845 ml  Net -9.11 ml   Filed Weights   02/28/19 0440 03/01/19 0515 03/03/19 0548  Weight: 59.6 kg 58.1 kg 56.8 kg    Examination:  General exam: awake, alert, no acute distress HEENT: NG tube in place, moist mucus membranes, hearing grossly normal  Respiratory system: exam limited by patient groaning, upper airway secretion sounds, no wheezes, rales or rhonchi, normal respiratory effort. Cardiovascular system: normal S1/S2, RRR, no JVD, murmurs, rubs, gallops, no pedal edema.   Gastrointestinal system: soft, non-tender, non-distended abdomen, no organomegaly or masses felt, normal bowel sounds. Central nervous system: alert and oriented x4. no gross focal neurologic deficits, normal speech Extremities: moves all, no edema, normal tone Psychiatry: normal mood, congruent affect, judgement and insight appear normal    Data Reviewed: I have personally reviewed following labs and imaging studies   CBC: Recent Labs  Lab 02/24/19 0834  02/27/19 0602 02/28/19 0519 03/01/19 0500 03/02/19 0506 03/03/19 0625  WBC 8.3   < > 10.7* 13.8* 13.6* 11.8* 11.1*  NEUTROABS 4.5  --   --   --  11.1*  --   --   HGB 11.8*   < > 12.3* 11.8* 12.1* 12.1* 11.4*  HCT 34.5*   < > 34.7* 33.5* 35.0* 33.1* 33.2*  MCV 84.4   < > 80.9 83.5 83.7 80.3 84.5  PLT 250   < > 273 258 268 286 318   < > = values in this interval not displayed.   Basic Metabolic Panel: Recent Labs  Lab 02/27/19 0602 02/28/19 0519 03/01/19 0500 03/02/19 0506 03/03/19 0625  NA 135 132* 133* 133* 132*  K 4.2 4.3 4.1 3.9 4.3  CL 105 102 103 104 102  CO2 22 19* 21* 19* 20*  GLUCOSE 77 105* 116* 131* 77  BUN 7* 11 9 12 14   CREATININE 1.18 1.26* 1.04 0.96 0.95  CALCIUM 8.6* 8.3* 8.7* 8.7* 8.5*  MG 2.1 1.8 1.9 2.1 2.0  PHOS 3.7 3.3  --   --  4.5   GFR: Estimated Creatinine Clearance: 59.8 mL/min (by C-G formula based on SCr of 0.95 mg/dL). Liver Function Tests: No results for input(s): AST, ALT, ALKPHOS, BILITOT, PROT, ALBUMIN in the last 168 hours. Recent Labs  Lab 02/24/19 0834  LIPASE 46   No results for input(s): AMMONIA in the last 168 hours. Coagulation Profile: No results for input(s): INR, PROTIME in the last 168 hours. Cardiac Enzymes: No results for input(s): CKTOTAL, CKMB,  CKMBINDEX, TROPONINI in the last 168 hours. BNP (last 3 results) No results for input(s): PROBNP in the last 8760 hours. HbA1C: No results for input(s): HGBA1C in the last 72 hours. CBG: No results for input(s): GLUCAP in the last 168 hours. Lipid Profile: No results for input(s): CHOL, HDL, LDLCALC, TRIG, CHOLHDL, LDLDIRECT in the last 72 hours. Thyroid Function Tests: No results for input(s): TSH, T4TOTAL, FREET4, T3FREE, THYROIDAB in the last 72 hours. Anemia Panel: No results for input(s): VITAMINB12, FOLATE, FERRITIN, TIBC, IRON, RETICCTPCT in the last 72 hours. Sepsis Labs: No results for input(s): PROCALCITON, LATICACIDVEN  in the last 168 hours.  Recent Results (from the past 240 hour(s))  SARS CORONAVIRUS 2 (TAT 6-24 HRS) Nasopharyngeal Nasopharyngeal Swab     Status: None   Collection Time: 02/23/19  4:30 PM   Specimen: Nasopharyngeal Swab  Result Value Ref Range Status   SARS Coronavirus 2 NEGATIVE NEGATIVE Final    Comment: (NOTE) SARS-CoV-2 target nucleic acids are NOT DETECTED. The SARS-CoV-2 RNA is generally detectable in upper and lower respiratory specimens during the acute phase of infection. Negative results do not preclude SARS-CoV-2 infection, do not rule out co-infections with other pathogens, and should not be used as the sole basis for treatment or other patient management decisions. Negative results must be combined with clinical observations, patient history, and epidemiological information. The expected result is Negative. Fact Sheet for Patients: SugarRoll.be Fact Sheet for Healthcare Providers: https://www.woods-mathews.com/ This test is not yet approved or cleared by the [place] FDA and  has been authorized for detection and/or diagnosis of SARS-CoV-2 by FDA under an Emergency Use Authorization (EUA). This EUA will remain  in effect (meaning this test can be used) for the duration of the COVID-19 declaration under Section 56 4(b)(1) of the Act, 21 U.S.C. section 360bbb-3(b)(1), unless the authorization is terminated or revoked sooner. Performed at [facility], [place] [address], [place], [place] [address]   Urine culture     Status: Abnormal   Collection Time: 02/24/19 10:17 AM   Specimen: Urine, Catheterized  Result Value Ref Range Status   Specimen Description   Final    URINE, CATHETERIZED Performed at [facility], [address], [place], [place] [address]    Special Requests   Final    NONE Performed at [facility], [address] [place], [place] [address]    Culture (A)  Final    <10,000  COLONIES/mL INSIGNIFICANT GROWTH Performed at [facility], [place] [address] [place], [place] [address]    Report Status 02/25/2019 FINAL  Final         Radiology Studies: No results found.      Scheduled Meds: . amLODipine  10 mg Oral Daily  . Chlorhexidine Gluconate Cloth  6 each Topical Daily  . cyanocobalamin  1,000 mcg Subcutaneous Daily  . feeding supplement (PRO-STAT SUGAR FREE 64)  30 mL Per Tube Daily  . free water  30 mL Per Tube Q4H  . hydrocerin   Topical BID  . lipase/protease/amylase)  20,880 Units Per Tube Once   And  . sodium bicarbonate  650 mg Per Tube Once  . nicotine  14 mg Transdermal Daily  . pantoprazole (PROTONIX) IV  40 mg Intravenous Q12H  . triamcinolone cream   Topical TID   Continuous Infusions: . feeding supplement (OSMOLITE 1.5 CAL) Stopped (03/02/19 1910)  . lactated ringers 75 mL/hr at 03/02/19 2018     LOS: 8 days  Time spent: 25-30 minutes    Ezekiel Slocumb, DO [facility] Pager: [phone]  If 7PM-7AM, please contact night-coverage [email or web address] Password [facility] 03/03/2019, 8:12 AM

## 2019-03-03 NOTE — Progress Notes (Signed)
POD # 4 BI Jejunostomy tube clogged unable to fix it AVSS Pain improving JP 15 serous NGT 240 cc  PE NAD Abd: incision c/d/i, no infection, drainage, no peritonitis. JP serous. Jejunostomy in place, I placed cocacola w/o success  A/P Doing well Upper GI series tomorrow until then keep NGT I expect we can start feeding him by mouth if his study is nml

## 2019-03-04 ENCOUNTER — Inpatient Hospital Stay: Payer: Medicare HMO

## 2019-03-04 LAB — CBC WITH DIFFERENTIAL/PLATELET
Abs Immature Granulocytes: 0.05 10*3/uL (ref 0.00–0.07)
Basophils Absolute: 0 10*3/uL (ref 0.0–0.1)
Basophils Relative: 0 %
Eosinophils Absolute: 0.1 10*3/uL (ref 0.0–0.5)
Eosinophils Relative: 1 %
HCT: 33.4 % — ABNORMAL LOW (ref 39.0–52.0)
Hemoglobin: 12 g/dL — ABNORMAL LOW (ref 13.0–17.0)
Immature Granulocytes: 0 %
Lymphocytes Relative: 9 %
Lymphs Abs: 1 10*3/uL (ref 0.7–4.0)
MCH: 29.3 pg (ref 26.0–34.0)
MCHC: 35.9 g/dL (ref 30.0–36.0)
MCV: 81.5 fL (ref 80.0–100.0)
Monocytes Absolute: 1.1 10*3/uL — ABNORMAL HIGH (ref 0.1–1.0)
Monocytes Relative: 9 %
Neutro Abs: 9.8 10*3/uL — ABNORMAL HIGH (ref 1.7–7.7)
Neutrophils Relative %: 81 %
Platelets: 329 10*3/uL (ref 150–400)
RBC: 4.1 MIL/uL — ABNORMAL LOW (ref 4.22–5.81)
RDW: 15.2 % (ref 11.5–15.5)
WBC: 12.1 10*3/uL — ABNORMAL HIGH (ref 4.0–10.5)
nRBC: 0 % (ref 0.0–0.2)

## 2019-03-04 LAB — BASIC METABOLIC PANEL
Anion gap: 11 (ref 5–15)
BUN: 10 mg/dL (ref 8–23)
CO2: 18 mmol/L — ABNORMAL LOW (ref 22–32)
Calcium: 8.7 mg/dL — ABNORMAL LOW (ref 8.9–10.3)
Chloride: 106 mmol/L (ref 98–111)
Creatinine, Ser: 0.93 mg/dL (ref 0.61–1.24)
GFR calc Af Amer: >60 mL/min (ref >60)
GFR calc non Af Amer: >60 mL/min (ref >60)
Glucose, Bld: 99 mg/dL (ref 70–99)
Potassium: 4.1 mmol/L (ref 3.5–5.1)
Sodium: 135 mmol/L (ref 135–145)

## 2019-03-04 LAB — MAGNESIUM: Magnesium: 1.9 mg/dL (ref 1.7–2.4)

## 2019-03-04 MED ORDER — ALBUTEROL SULFATE (2.5 MG/3ML) 0.083% IN NEBU
3.0000 mL | INHALATION_SOLUTION | Freq: Four times a day (QID) | RESPIRATORY_TRACT | Status: DC
  Administered 2019-03-04 – 2019-03-05 (×4): 3 mL via RESPIRATORY_TRACT
  Filled 2019-03-04 (×4): qty 3

## 2019-03-04 MED ORDER — IOPAMIDOL (ISOVUE-300) INJECTION 61%
150.0000 mL | Freq: Once | INTRAVENOUS | Status: AC | PRN
  Administered 2019-03-04: 100 mL

## 2019-03-04 MED ORDER — BOOST / RESOURCE BREEZE PO LIQD CUSTOM
1.0000 | Freq: Three times a day (TID) | ORAL | Status: DC
  Administered 2019-03-04 (×2): 1 via ORAL

## 2019-03-04 MED ORDER — ENSURE ENLIVE PO LIQD
237.0000 mL | Freq: Three times a day (TID) | ORAL | Status: DC
  Administered 2019-03-05 – 2019-03-06 (×2): 237 mL via ORAL

## 2019-03-04 MED ORDER — GUAIFENESIN ER 600 MG PO TB12
1200.0000 mg | ORAL_TABLET | Freq: Two times a day (BID) | ORAL | Status: DC
  Administered 2019-03-04 – 2019-03-06 (×4): 1200 mg via ORAL
  Filled 2019-03-04 (×5): qty 2

## 2019-03-04 NOTE — Progress Notes (Signed)
Physical Therapy Treatment Patient Details Name: Keith [name] MRN: [id number] DOB: [birth year] Today's Date: 03/04/2019    History of Present Illness Pt is a 68 year old male who presented to ER and admitted for management of chest, epigastric pain; noted with gastric outlet obstruction.  Status post gastrectomy, vagotomy and feeding jejunostomy (11/18).    PT Comments    Pt slightly less impulsive today but still required VC's for log roll and management of IV.  Pt able to slowly roll to side and sit up and return to bed with education.  Pt reported high pain level in abdomen, affecting ability to participate with supine there ex.  RN notified that pt is requesting pain medication. He was able to ambulate 200 ft with IV pole and PT recommended SPC to which pt was agreeable to trial.  No LOB's or outstanding deviations during gait.  Pt will continue to benefit from skilled PT with focus on strength, tolerance to activity and safe functional mobility.  DC plan updated to home with home health PT due to improvement in mobility.  Follow Up Recommendations  Home health PT;Supervision - Intermittent     Equipment Recommendations  Rolling walker with 5" wheels;3in1 (PT)    Recommendations for Other Services       Precautions / Restrictions Precautions Precautions: Fall Precaution Comments: NG, feeding tube and drain Restrictions Weight Bearing Restrictions: No    Mobility  Bed Mobility Overal bed mobility: Needs Assistance Bed Mobility: Sidelying to Sit   Sidelying to sit: Supervision Supine to sit: Supervision     General bed mobility comments: VC's for log roll technique; pt slow with bed mobility but able.  Transfers Overall transfer level: Needs assistance Equipment used: None Transfers: Sit to/from Stand Sit to Stand: Min guard         General transfer comment: Stands with use of UE's.  Flexed posture and mildly unsteady.  Ambulation/Gait Ambulation/Gait  assistance: Min guard Gait Distance (Feet): 200 Feet Assistive device: IV Pole     Gait velocity interpretation: 1.31 - 2.62 ft/sec, indicative of limited community ambulator General Gait Details: Flexed posture, increased step length, fair foot clearance with mild unsteadiness.  When asked if pt would use a SPC he replied "nope" but stated that he would try it after some education.   Stairs             Wheelchair Mobility    Modified Rankin (Stroke Patients Only)       Balance Overall balance assessment: Needs assistance Sitting-balance support: Feet supported Sitting balance-Leahy Scale: Good     Standing balance support: Single extremity supported Standing balance-Leahy Scale: Fair                              Cognition Arousal/Alertness: Awake/alert Behavior During Therapy: WFL for tasks assessed/performed;Impulsive Overall Cognitive Status: Within Functional Limits for tasks assessed                                 General Comments: somewhat impulsive trying to get out of bed before I was ready managing tubes and epuipment.  Follows directions as long as you stay a step ahead.      Exercises Other Exercises Other Exercises: Reviewed supine ther ex including ankle pumps, heel slides, pillow squeeze and quad sets and pt stated he is in too much pain to perform.  RN notified pt requesting pain meds.    General Comments        Pertinent Vitals/Pain Pain Assessment: Faces Faces Pain Scale: Hurts even more Pain Location: abdomen Pain Descriptors / Indicators: Aching;Grimacing;Guarding Pain Intervention(s): Limited activity within patient's tolerance;Monitored during session    Home Living                      Prior Function            PT Goals (current goals can now be found in the care plan section) Acute Rehab PT Goals Patient Stated Goal: to make the pain better PT Goal Formulation: With patient Time For Goal  Achievement: 03/14/19 Potential to Achieve Goals: Good Progress towards PT goals: Progressing toward goals    Frequency    Min 2X/week      PT Plan Current plan remains appropriate;Other (comment)    Co-evaluation              AM-PAC PT "6 Clicks" Mobility   Outcome Measure  Help needed turning from your back to your side while in a flat bed without using bedrails?: A Little Help needed moving from lying on your back to sitting on the side of a flat bed without using bedrails?: A Little Help needed moving to and from a bed to a chair (including a wheelchair)?: A Little Help needed standing up from a chair using your arms (e.g., wheelchair or bedside chair)?: A Little Help needed to walk in hospital room?: A Little Help needed climbing 3-5 steps with a railing? : A Little 6 Click Score: 18    End of Session Equipment Utilized During Treatment: Gait belt Activity Tolerance: Patient tolerated treatment well;Patient limited by pain Patient left: in bed;with call bell/phone within reach;with bed alarm set;with nursing/sitter in room Nurse Communication: Other (comment)       Time: [id number] PT Time Calculation (min) (ACUTE ONLY): 11 min  Charges:  $Therapeutic Exercise: 8-22 mins                     Roxanne Gates, PT, DPT    Roxanne Gates 03/04/2019, 3:16 PM

## 2019-03-04 NOTE — Progress Notes (Signed)
PROGRESS NOTE    SARIM PROPER  [id number] DOB: Jun 20, 1950 DOA: 02/23/2019  PCP: Patient, No Pcp Per    LOS - 9   Brief Narrative:  68 y.o.malewith medical history ofPUD, hypertension, a-fibnot on anticoagulationsecondary toPUD,presented withchest and epigastric pain. EGD backon 5/6/2020which showed LA grade D esophagitis, non-bleeding gastric ulcer, congested friable mucosa in the pylorus s/p epinephrine and large duodenal ulcer with duodenal stenosis.plan was for pt ot have repeat egd and he did not follow up.Patient reports attempts to be seen at [facility] but has been unable to get through to schedule. In the ED, HR 40, BP 100/76, otherwise vitals normal. Abdominal xray showed nonobstructive gas pattern. Patient admitted for further evaluation. Gastroenterology consulted. Patient underwent EGD 11/17 which showed pyloric stenosis and LA grade D reflux esophagitis without active bleeding. General surgery consulted and patient taken to surgery on 11/18for antrectomy with Bilroth 1 reconstruction, vagotomy and jejunostomy feeding tube placement.  Subjective 11/23: Patient seen awake in bed this AM.  He is in very good spirits today because he says he was told he gets to go home Wednesday.  NG tube is out and he is drinking liquids, tolerating.  He reports ongoing pain postop, but slowly improving and appears in much less discomfort today.  Denies fever/chills, chest pain or SOB, N/V/D.  No acute events reported overnight.   Assessment & Plan:   Principal Problem:   Acute epigastric pain Active Problems:   Atrial fibrillation (HCC)   Abdominal pain   Left-sided chest pain   Essential hypertension   Tobacco use disorder   Moderate protein-energy malnutrition (HCC)   Acute kidney injury superimposed on CKD (HCC)   Hypokalemia   Troponin level elevated   Dehydration   Acute gastritis without hemorrhage   Vitamin B12 deficiency   Intractable vomiting   Postpyloric  ulcer  Acute epigastric pain/chest painsecondary to Gastric Outlet Obstruction due to pyloric stenosis History of Peptic Ulcer Disease, chronic, stable History of Reflux Esophagitis, chronic, stable See above narrative for hospital course to date. Surgery done for pyloric stenosis 11/18.  - GI and general surgery following  NG tube (out 11/23)    Tube feeds (stopped, tube clogged)    Drain etc per surgery - continue Protonix 40 mg IV q12h until taking PO meds - pain management post-op - mobilize patient, PT  Paroxysmal atrial fibrillation- rate controlled - Not on anticoagulationdue to PUD  -follow-up with cardiologyoutpatient  Hypokalemia- repleted Hypomagnesemia- repleted - monitor daily  - replete as needed for K>4.0 and Mg>2.0  Mildly elevated high-sensitivity troponin Troponin levels flattened.Not consistent withACS, more likelydemand ischemia. - cardiology recommends outpatient stress test  Abnormal thyroid function studies Will need repeat thyroid function studies done in about 4 to 6 weeks in the outpatient setting and if abnormal will need further evaluation by PCP.  Hypertension- chronic, stable Blood pressure has been elevated post-op, but improving - continue home Norvasc - Hydralazine or labetalol IV as needed - med history appears not given very often, requested RN to use as needed / ordered for better control  Tobacco abuse Tobacco cessation stressed to patient. Continue nicotine patch.  Acute kidney injury on chronic kidney disease Unclear baseline. Renal function improving with hydration. Urinalysis nitrite negative, leukocytes negative, negative for protein. Urine sodium was 21. Urine creatinine of 90. Avoid nephrotoxins. DecreaseddIV fluids to 75 cc/h. Follow.   Constipation Patient states that bowel movements yesterday after sorbitol. Continue current bowel regimen of MiraLAX daily and Senokot-S nightly.  Moderate  protein calorie malnutrition  Vitamin B12 deficiency Vitamin B12 levels at 173.Continuevitamin B12 1000 MCG subcutaneously daily x1 week, and then weekly x1 month, and then monthly. Will need outpatient follow-up with PCP.   DVT prophylaxis:SCDs Code Status: Full Code Family Communication:None at bedside Disposition Plan:Pending clinical improvement and clearance by GI and surgery   Consultants:  General surgery  Gastroenterology  Procedures:  EGD 11/18  Surgery 11/19  Antimicrobials:  None    Objective: Vitals:   03/03/19 1557 03/03/19 1938 03/04/19 0336 03/04/19 0721  BP: 136/90 (!) 145/94 (!) 143/82 (!) 143/98  Pulse: 73 89 88 75  Resp: 16   17  Temp: 98 F (36.7 C) 98.6 F (37 C) 99.4 F (37.4 C) 98.7 F (37.1 C)  TempSrc:  Oral Oral Oral  SpO2: 94% 96% 97% 97%  Weight:   56.3 kg   Height:        Intake/Output Summary (Last 24 hours) at 03/04/2019 0810 Last data filed at 03/04/2019 [id number] Gross per 24 hour  Intake 1743.19 ml  Output 1715 ml  Net 28.19 ml   Filed Weights   03/01/19 0515 03/03/19 0548 03/04/19 0336  Weight: 58.1 kg 56.8 kg 56.3 kg    Examination:  General exam: awake, alert, no acute distress, frail Respiratory system: upper airway secretion sounds greater on right than left, no wheezes, rales or rhonchi, normal respiratory effort. Cardiovascular system: normal S1/S2, RRR, no JVD, murmurs, rubs, gallops, no pedal edema.   Gastrointestinal system: soft, non-tender, non-distended abdomen, no organomegaly or masses felt, normal bowel sounds. Central nervous system: alert and oriented x4. no gross focal neurologic deficits, normal speech Extremities: moves all, no edema, normal tone Skin: dry, intact, normal temperature Psychiatry: normal mood, congruent affect, judgement and insight appear normal    Data Reviewed: I have personally reviewed following labs and imaging studies  CBC: Recent Labs  Lab 02/28/19  0519 03/01/19 0500 03/02/19 0506 03/03/19 0625 03/04/19 0628  WBC 13.8* 13.6* 11.8* 11.1* 12.1*  NEUTROABS  --  11.1*  --   --  9.8*  HGB 11.8* 12.1* 12.1* 11.4* 12.0*  HCT 33.5* 35.0* 33.1* 33.2* 33.4*  MCV 83.5 83.7 80.3 84.5 81.5  PLT 258 268 286 318 [id number]   Basic Metabolic Panel: Recent Labs  Lab 02/27/19 0602 02/28/19 0519 03/01/19 0500 03/02/19 0506 03/03/19 0625 03/04/19 0628  NA 135 132* 133* 133* 132* 135  K 4.2 4.3 4.1 3.9 4.3 4.1  CL 105 102 103 104 102 106  CO2 22 19* 21* 19* 20* 18*  GLUCOSE 77 105* 116* 131* 77 99  BUN 7* 11 9 12 14 10   CREATININE 1.18 1.26* 1.04 0.96 0.95 0.93  CALCIUM 8.6* 8.3* 8.7* 8.7* 8.5* 8.7*  MG 2.1 1.8 1.9 2.1 2.0 1.9  PHOS 3.7 3.3  --   --  4.5  --    GFR: Estimated Creatinine Clearance: 60.5 mL/min (by C-G formula based on SCr of 0.93 mg/dL). Liver Function Tests: No results for input(s): AST, ALT, ALKPHOS, BILITOT, PROT, ALBUMIN in the last 168 hours. No results for input(s): LIPASE, AMYLASE in the last 168 hours. No results for input(s): AMMONIA in the last 168 hours. Coagulation Profile: No results for input(s): INR, PROTIME in the last 168 hours. Cardiac Enzymes: No results for input(s): CKTOTAL, CKMB, CKMBINDEX, TROPONINI in the last 168 hours. BNP (last 3 results) No results for input(s): PROBNP in the last 8760 hours. HbA1C: No results for input(s): HGBA1C in the last 72  hours. CBG: No results for input(s): GLUCAP in the last 168 hours. Lipid Profile: No results for input(s): CHOL, HDL, LDLCALC, TRIG, CHOLHDL, LDLDIRECT in the last 72 hours. Thyroid Function Tests: No results for input(s): TSH, T4TOTAL, FREET4, T3FREE, THYROIDAB in the last 72 hours. Anemia Panel: No results for input(s): VITAMINB12, FOLATE, FERRITIN, TIBC, IRON, RETICCTPCT in the last 72 hours. Sepsis Labs: No results for input(s): PROCALCITON, LATICACIDVEN in the last 168 hours.  Recent Results (from the past 240 hour(s))  SARS CORONAVIRUS 2  (TAT 6-24 HRS) Nasopharyngeal Nasopharyngeal Swab     Status: None   Collection Time: 02/23/19  4:30 PM   Specimen: Nasopharyngeal Swab  Result Value Ref Range Status   SARS Coronavirus 2 NEGATIVE NEGATIVE Final    Comment: (NOTE) SARS-CoV-2 target nucleic acids are NOT DETECTED. The SARS-CoV-2 RNA is generally detectable in upper and lower respiratory specimens during the acute phase of infection. Negative results do not preclude SARS-CoV-2 infection, do not rule out co-infections with other pathogens, and should not be used as the sole basis for treatment or other patient management decisions. Negative results must be combined with clinical observations, patient history, and epidemiological information. The expected result is Negative. Fact Sheet for Patients: SugarRoll.be Fact Sheet for Healthcare Providers: https://www.woods-mathews.com/ This test is not yet approved or cleared by the [place] FDA and  has been authorized for detection and/or diagnosis of SARS-CoV-2 by FDA under an Emergency Use Authorization (EUA). This EUA will remain  in effect (meaning this test can be used) for the duration of the COVID-19 declaration under Section 56 4(b)(1) of the Act, 21 U.S.C. section 360bbb-3(b)(1), unless the authorization is terminated or revoked sooner. Performed at [facility], [place] [address], [place], [place] [address]   Urine culture     Status: Abnormal   Collection Time: 02/24/19 10:17 AM   Specimen: Urine, Catheterized  Result Value Ref Range Status   Specimen Description   Final    URINE, CATHETERIZED Performed at [facility], [address] [place], [place] [address]    Special Requests   Final    NONE Performed at [facility], [address] [place], [place] [address]    Culture (A)  Final    <10,000 COLONIES/mL INSIGNIFICANT GROWTH Performed at [facility], [place] [address],  [place], [place] [address]    Report Status 02/25/2019 FINAL  Final         Radiology Studies: No results found.      Scheduled Meds: . amLODipine  10 mg Oral Daily  . Chlorhexidine Gluconate Cloth  6 each Topical Daily  . cyanocobalamin  1,000 mcg Subcutaneous Daily  . feeding supplement (PRO-STAT SUGAR FREE 64)  30 mL Per Tube Daily  . free water  30 mL Per Tube Q4H  . hydrocerin   Topical BID  . lipase/protease/amylase)  20,880 Units Per Tube Once   And  . sodium bicarbonate  650 mg Per Tube Once  . nicotine  14 mg Transdermal Daily  . pantoprazole (PROTONIX) IV  40 mg Intravenous Q12H  . triamcinolone cream   Topical TID   Continuous Infusions: . dextrose 5 % and 0.9% NaCl 100 mL/hr at 03/04/19 0556  . feeding supplement (OSMOLITE 1.5 CAL) Stopped (03/02/19 1910)     LOS: 9 days    Time spent: 25-30 minutes    Ezekiel Slocumb, DO [facility] Pager: [phone]  If 7PM-7AM, please contact night-coverage [email or web address] Password [facility] 03/04/2019,  8:10 AM

## 2019-03-04 NOTE — Care Management Important Message (Signed)
Important Message  Patient Details  Name: Keith [name] MRN: [id number] Date of Birth: 1950-06-23   Medicare Important Message Given:  Yes     Dannette Barbara 03/04/2019, 11:19 AM

## 2019-03-04 NOTE — Progress Notes (Signed)
Nutrition Follow Up Note   DOCUMENTATION CODES:   Non-severe (moderate) malnutrition in context of social or environmental circumstances  INTERVENTION:   Ensure Enlive po TID, each supplement provides 350 kcal and 20 grams of protein  MVI daily   NUTRITION DIAGNOSIS:   Moderate Malnutrition related to social / environmental circumstances(polysubstance abuse) as evidenced by moderate fat depletion, moderate muscle depletion, severe muscle depletion.  GOAL:   Patient will meet greater than or equal to 90% of their needs  -progressing    MONITOR:   PO intake, Supplement acceptance, Labs, Weight trends, Skin, I & O's  ASSESSMENT:   68 year old male with PMHx of paroxysmal A-fib, HTN, polysubstance abuse (cocaine positive), hx of gastric and duodenal ulcers admitted with epigastric pain likely from underlying ulcers.   Pt s/p EGD 11/17, found to have LA Grade D reflux esophagitis with no bleeding, gastric stenosis was found at the pylorus which was dilated, non-bleeding duodenal ulcer  Pt s/p Billroth I with anastomosis created between the antrum and the second portion of the duodenum and s/p placement of red rubber catheter #16 French Jejunostomy tube and 19 Blake drain 11/18  Pt tolerating tube feeds well up until Saturday when J-tube become clogged. J-tube has been unable to be unclogged. Pt advanced to a full liquid diet today. RD will change pt to Ensure supplements to help him meet his estimated needs. Per chart, pt is weight stable since admit.   Medications reviewed and include: B12, MVI, nicotine, protonix, miralax, senokot, LRS @75ml /hr  Labs reviewed: K 4.2 wnl, P 3.7 wnl, Mg 2.1 wnl Wbc- 10.7(H) B12 173(L)- 11/16  Diet Order:   Diet Order            Diet full liquid Room service appropriate? Yes; Fluid consistency: Thin  Diet effective 1400             EDUCATION NEEDS:   No education needs have been identified at this time  Skin:  Skin Assessment: Reviewed  RN Assessment  Last BM:  11/20- type 6  Height:   Ht Readings from Last 1 Encounters:  02/23/19 5\' 7"  (1.702 m)   Weight:   Wt Readings from Last 1 Encounters:  03/04/19 56.3 kg   Ideal Body Weight:  67.3 kg  BMI:  Body mass index is 19.44 kg/m.  Estimated Nutritional Needs:   Kcal:  1700-2000kcal/day  Protein:  85-100g/day  Fluid:  1.5-1.8L/day  Koleen Distance MS, RD, LDN Pager #- [phone] Office#- [phone] After Hours Pager: [phone]

## 2019-03-04 NOTE — Progress Notes (Signed)
POD # 5 s/p Billroth I Doing well Upper gi series no leak no obstruction NGT 10cc JP 25cc  PE NAD Abd: staples in place, no infection. JP serous. No peritonitis . Jejunostomy tube clogged.  A/P Doing well DC NGT, start liquid diet no carbonation May advance diet slowly Mobilize DC Wednesday

## 2019-03-05 ENCOUNTER — Inpatient Hospital Stay: Payer: Medicare HMO

## 2019-03-05 MED ORDER — ALBUTEROL SULFATE (2.5 MG/3ML) 0.083% IN NEBU
3.0000 mL | INHALATION_SOLUTION | Freq: Three times a day (TID) | RESPIRATORY_TRACT | Status: DC
  Administered 2019-03-05 – 2019-03-06 (×2): 3 mL via RESPIRATORY_TRACT
  Filled 2019-03-05 (×2): qty 3

## 2019-03-05 MED ORDER — ONDANSETRON HCL 4 MG/2ML IJ SOLN
4.0000 mg | Freq: Four times a day (QID) | INTRAMUSCULAR | Status: DC | PRN
  Administered 2019-03-05: 4 mg via INTRAVENOUS
  Filled 2019-03-05: qty 2

## 2019-03-05 MED ORDER — PROMETHAZINE HCL 25 MG/ML IJ SOLN: 12.5000 mg | Freq: Four times a day (QID) | INTRAMUSCULAR | Status: DC | PRN

## 2019-03-05 NOTE — Progress Notes (Signed)
PROGRESS NOTE    Keith [name]  [id number] DOB: 1950/11/27 DOA: 02/23/2019  PCP: Patient, No Pcp Per    LOS - 10   Brief Narrative:  68 y.o.malewith medical history ofPUD, hypertension, a-fibnot on anticoagulationsecondary toPUD,presented withchest and epigastric pain. EGD backon 5/6/2020which showed LA grade D esophagitis, non-bleeding gastric ulcer, congested friable mucosa in the pylorus s/p epinephrine and large duodenal ulcer with duodenal stenosis.plan was for pt ot have repeat egd and he did not follow up.Patient reports attempts to be seen at [facility] but has been unable to get through to schedule. In the ED, HR 40, BP 100/76, otherwise vitals normal. Abdominal xray showed nonobstructive gas pattern. Patient admitted for further evaluation. Gastroenterology consulted. Patient underwent EGD 11/17 which showed pyloric stenosis and LA grade D reflux esophagitis without active bleeding. General surgery consulted and patient taken to surgery on 11/18for antrectomy with Bilroth 1 reconstruction, vagotomy and jejunostomy feeding tube placement.  Subjective 11/24: Patient awake laying in bed.  Surgery PA at bedside.  Patient had NG removed yesterday, now on full liquid diet.  Tolerating fair, but having a lot of belching and reflux.  Patient states he is going home 8AM tomorrow, but states he is too weak to walk.  States he has to cook [place] for his family.  Advised patient of need to tolerate oral intake.  No acute events overnight.  Patient denies fever/chills, N/V/D or other acute complaints.  Assessment & Plan:   Principal Problem:   Acute epigastric pain Active Problems:   Atrial fibrillation (HCC)   Abdominal pain   Left-sided chest pain   Essential hypertension   Tobacco use disorder   Moderate protein-energy malnutrition (HCC)   Acute kidney injury superimposed on CKD (HCC)   Hypokalemia   Troponin level elevated   Dehydration   Acute gastritis without  hemorrhage   Vitamin B12 deficiency   Intractable vomiting   Postpyloric ulcer   Acute epigastric pain/chest painsecondary to Gastric Outlet Obstruction due to pyloric stenosis History of Peptic Ulcer Disease, chronic, stable History of Reflux Esophagitis, chronic, stable See above narrative for hospital course to date. Surgery done for pyloric stenosis 11/18.  - GI and general surgery following  NG tube (out 11/23)    Tube feeds (stopped, tube clogged)    Drain etc per surgery    J tube clogged, not to use - full liquid diet + supplemental shakes - continue Protonix 40 mg IV q12h until taking PO meds - pain management post-op - mobilize patient, PT recommends home health PT & RW  Paroxysmal atrial fibrillation- rate controlled - Not on anticoagulationdue to PUD  -follow-up with cardiologyoutpatient  Hypokalemia- repleted Hypomagnesemia- repleted - monitor daily  - replete as needed for K>4.0 and Mg>2.0  Mildly elevated high-sensitivity troponin Troponin levels flattened.Not consistent withACS, more likelydemand ischemia. - cardiology recommends outpatient stress test  Abnormal thyroid function studies Will need repeat thyroid function studies done in about 4 to 6 weeks in the outpatient setting and if abnormal will need further evaluation by PCP.  Hypertension- chronic, stable Blood pressurehas been elevated post-op, but improving - continue home Norvasc - Hydralazine or labetalol IV as needed- med history appears not given very often, requested RN to use as needed / ordered for better control  Tobacco abuse Tobacco cessation stressed to patient. Continue nicotine patch.  Acute kidney injury on chronic kidney disease Unclear baseline. Renal function improving with hydration. Urinalysis nitrite negative, leukocytes negative, negative for protein. Urine sodium was 21.  Urine creatinine of 90. Avoid nephrotoxins. DecreaseddIV fluids to 75  cc/h. Follow.   Constipation Patient states that bowel movements yesterday after sorbitol. Continue current bowel regimen of MiraLAX daily and Senokot-S nightly.   Moderate protein calorie malnutrition  Vitamin B12 deficiency Vitamin B12 levels at 173.Continuevitamin B12 1000 MCG subcutaneously daily x1 week, and then weekly x1 month, and then monthly. Will need outpatient follow-up with PCP.   DVT prophylaxis:SCDs Code Status: Full Code Family Communication:None at bedside Disposition Plan:Pending clinical improvement and clearance by GI and surgery   Consultants:  General surgery  Gastroenterology  Procedures:  EGD 11/18  Surgery 11/19  Antimicrobials:  None   Objective: Vitals:   03/04/19 1542 03/04/19 2051 03/04/19 2101 03/05/19 0448  BP: 123/77 (!) 139/96  (!) 137/98  Pulse: 83 71  75  Resp: 18 20  20   Temp: 97.8 F (36.6 C) 98.6 F (37 C)  98.1 F (36.7 C)  TempSrc: Oral Oral  Oral  SpO2: 96% 98% 96% 95%  Weight:      Height:        Intake/Output Summary (Last 24 hours) at 03/05/2019 0753 Last data filed at 03/05/2019 0203 Gross per 24 hour  Intake --  Output 995 ml  Net -995 ml   Filed Weights   03/01/19 0515 03/03/19 0548 03/04/19 0336  Weight: 58.1 kg 56.8 kg 56.3 kg    Examination:  General exam: awake, alert, no acute distress, underweight HEENT: moist mucus membranes, hearing grossly normal  Respiratory system: clear to auscultation bilaterally, no wheezes, rales or rhonchi, normal respiratory effort. Cardiovascular system: normal S1/S2, RRR, no JVD, murmurs, rubs, gallops, no pedal edema.   Gastrointestinal system: soft, appropriate postop tenderness, non-distended drain in place, J tube in place. Central nervous system: alert and oriented x4. no gross focal neurologic deficits, normal speech Extremities: moves all, no edema, normal tone Skin: dry, intact, normal temperature Psychiatry: normal mood, congruent  affect, judgement and insight appear normal    Data Reviewed: I have personally reviewed following labs and imaging studies  CBC: Recent Labs  Lab 02/28/19 0519 03/01/19 0500 03/02/19 0506 03/03/19 0625 03/04/19 0628  WBC 13.8* 13.6* 11.8* 11.1* 12.1*  NEUTROABS  --  11.1*  --   --  9.8*  HGB 11.8* 12.1* 12.1* 11.4* 12.0*  HCT 33.5* 35.0* 33.1* 33.2* 33.4*  MCV 83.5 83.7 80.3 84.5 81.5  PLT 258 268 286 318 [id number]   Basic Metabolic Panel: Recent Labs  Lab 02/27/19 0602 02/28/19 0519 03/01/19 0500 03/02/19 0506 03/03/19 0625 03/04/19 0628  NA 135 132* 133* 133* 132* 135  K 4.2 4.3 4.1 3.9 4.3 4.1  CL 105 102 103 104 102 106  CO2 22 19* 21* 19* 20* 18*  GLUCOSE 77 105* 116* 131* 77 99  BUN 7* 11 9 12 14 10   CREATININE 1.18 1.26* 1.04 0.96 0.95 0.93  CALCIUM 8.6* 8.3* 8.7* 8.7* 8.5* 8.7*  MG 2.1 1.8 1.9 2.1 2.0 1.9  PHOS 3.7 3.3  --   --  4.5  --    GFR: Estimated Creatinine Clearance: 60.5 mL/min (by C-G formula based on SCr of 0.93 mg/dL). Liver Function Tests: No results for input(s): AST, ALT, ALKPHOS, BILITOT, PROT, ALBUMIN in the last 168 hours. No results for input(s): LIPASE, AMYLASE in the last 168 hours. No results for input(s): AMMONIA in the last 168 hours. Coagulation Profile: No results for input(s): INR, PROTIME in the last 168 hours. Cardiac Enzymes: No results for input(s): CKTOTAL, CKMB,  CKMBINDEX, TROPONINI in the last 168 hours. BNP (last 3 results) No results for input(s): PROBNP in the last 8760 hours. HbA1C: No results for input(s): HGBA1C in the last 72 hours. CBG: No results for input(s): GLUCAP in the last 168 hours. Lipid Profile: No results for input(s): CHOL, HDL, LDLCALC, TRIG, CHOLHDL, LDLDIRECT in the last 72 hours. Thyroid Function Tests: No results for input(s): TSH, T4TOTAL, FREET4, T3FREE, THYROIDAB in the last 72 hours. Anemia Panel: No results for input(s): VITAMINB12, FOLATE, FERRITIN, TIBC, IRON, RETICCTPCT in the last 72  hours. Sepsis Labs: No results for input(s): PROCALCITON, LATICACIDVEN in the last 168 hours.  Recent Results (from the past 240 hour(s))  SARS CORONAVIRUS 2 (TAT 6-24 HRS) Nasopharyngeal Nasopharyngeal Swab     Status: None   Collection Time: 02/23/19  4:30 PM   Specimen: Nasopharyngeal Swab  Result Value Ref Range Status   SARS Coronavirus 2 NEGATIVE NEGATIVE Final    Comment: (NOTE) SARS-CoV-2 target nucleic acids are NOT DETECTED. The SARS-CoV-2 RNA is generally detectable in upper and lower respiratory specimens during the acute phase of infection. Negative results do not preclude SARS-CoV-2 infection, do not rule out co-infections with other pathogens, and should not be used as the sole basis for treatment or other patient management decisions. Negative results must be combined with clinical observations, patient history, and epidemiological information. The expected result is Negative. Fact Sheet for Patients: SugarRoll.be Fact Sheet for Healthcare Providers: https://www.woods-mathews.com/ This test is not yet approved or cleared by the [place] FDA and  has been authorized for detection and/or diagnosis of SARS-CoV-2 by FDA under an Emergency Use Authorization (EUA). This EUA will remain  in effect (meaning this test can be used) for the duration of the COVID-19 declaration under Section 56 4(b)(1) of the Act, 21 U.S.C. section 360bbb-3(b)(1), unless the authorization is terminated or revoked sooner. Performed at [facility], [place] [address] [place], [place] [address]   Urine culture     Status: Abnormal   Collection Time: 02/24/19 10:17 AM   Specimen: Urine, Catheterized  Result Value Ref Range Status   Specimen Description   Final    URINE, CATHETERIZED Performed at [facility], [address], [place], [place] [address]    Special Requests   Final    NONE Performed at [facility], [address] [place], [place] [address]    Culture (A)  Final    <10,000 COLONIES/mL INSIGNIFICANT GROWTH Performed at [facility], [place] [address], [place], [place] [address]    Report Status 02/25/2019 FINAL  Final         Radiology Studies: Dg Ugi W Single Cm (sol Or Thin Ba)  Result Date: 03/04/2019 CLINICAL DATA:  Status post Billroth partial gastrectomy, assess for anastomotic leak EXAM: WATER SOLUBLE UPPER GI SERIES TECHNIQUE: Single-column upper GI series was performed using water soluble contrast. CONTRAST:  150 mL dilute Omnipaque 300 iodinated contrast COMPARISON:  None. FLUOROSCOPY TIME:  Fluoroscopy Time:  00:54 Number of Acquired Spot Images: 6 FINDINGS: 150 mL dilute Omnipaque 300 water soluble contrast was injected via nasogastric tube. The patient was placed in right decubitus to assist with gastric emptying. The gastric remnant and proximal small bowel were opacified without evidence of anastomotic leak. The remaining contrast was gently aspirated from the stomach. IMPRESSION: No evidence of anastomotic leak status post Billroth partial gastrectomy. Electronically Signed   By: Eddie Candle M.D.   On: 03/04/2019 10:33  Scheduled Meds:  albuterol  3 mL Inhalation Q6H   amLODipine  10 mg Oral Daily   Chlorhexidine Gluconate Cloth  6 each Topical Daily   cyanocobalamin  1,000 mcg Subcutaneous Daily   feeding supplement (ENSURE ENLIVE)  237 mL Oral TID BM   guaiFENesin  1,200 mg Oral BID   hydrocerin   Topical BID   nicotine  14 mg Transdermal Daily   pantoprazole (PROTONIX) IV  40 mg Intravenous Q12H   triamcinolone cream   Topical TID   Continuous Infusions:  dextrose 5 % and 0.9% NaCl 100 mL/hr at 03/05/19 0103     LOS: 10 days    Time spent: 25-30 minutes    Ezekiel Slocumb, DO [facility] Pager: [phone]  If 7PM-7AM, please contact night-coverage [email or web address] Password [facility] 03/05/2019, 7:53 AM

## 2019-03-05 NOTE — Progress Notes (Signed)
[facility] Hospital Day(s): 10.   Post op day(s): 6 Days Post-Op.   Interval History:  Patient seen and examined no acute events or new complaints overnight.  Patient reports he is feeling somewhat better but his biggest complaint this morning is frequent burping.  Incisional pain improving. No significant nausea or emesis.  He reports no flatus of bowel function this morning.  Jejunostomy tube clogged over the weekend Started on CLD and tolerating well, decreased appetite  Vital signs in last 24 hours: [min-max] current  Temp:  [97.8 F (36.6 C)-98.6 F (37 C)] 98.1 F (36.7 C) (11/24 0448) Pulse Rate:  [71-83] 75 (11/24 0448) Resp:  [18-20] 20 (11/24 0448) BP: (123-139)/(77-98) 137/98 (11/24 0448) SpO2:  [95 %-98 %] 95 % (11/24 0448)     Height: 5\' 7"  (170.2 cm) Weight: 56.3 kg BMI (Calculated): 19.43   Intake/Output last 2 shifts:  11/23 0701 - 11/24 0700 In: -  Out: 995 [Urine:975; Drains:20]   Physical Exam:  Constitutional: alert, cooperative and no distress  Respiratory: breathing non-labored at rest  Cardiovascular: regular rate and sinus rhythm  Gastrointestinal: soft, incisional tenderness, and non-distended. No rebound/guarding. JP in RLQ with serosanguinous output. Jejunostomy tube in left abdomen, clogged.  Integumentary: laparotomy incision is CDI with staples, no erythema or drainage  Labs:  CBC Latest Ref Rng & Units 03/04/2019 03/03/2019 03/02/2019  WBC 4.0 - 10.5 K/uL 12.1(H) 11.1(H) 11.8(H)  Hemoglobin 13.0 - 17.0 g/dL 12.0(L) 11.4(L) 12.1(L)  Hematocrit 39.0 - 52.0 % 33.4(L) 33.2(L) 33.1(L)  Platelets 150 - 400 K/uL 329 318 286   CMP Latest Ref Rng & Units 03/04/2019 03/03/2019 03/02/2019  Glucose 70 - 99 mg/dL 99 77 131(H)  BUN 8 - 23 mg/dL 10 14 12   Creatinine 0.61 - 1.24 mg/dL 0.93 0.95 0.96  Sodium 135 - 145 mmol/L 135 132(L) 133(L)  Potassium 3.5 - 5.1 mmol/L 4.1 4.3 3.9  Chloride 98 - 111 mmol/L 106  102 104  CO2 22 - 32 mmol/L 18(L) 20(L) 19(L)  Calcium 8.9 - 10.3 mg/dL 8.7(L) 8.5(L) 8.7(L)  Total Protein 6.5 - 8.1 g/dL - - -  Total Bilirubin 0.3 - 1.2 mg/dL - - -  Alkaline Phos 38 - 126 U/L - - -  AST 15 - 41 U/L - - -  ALT 0 - 44 U/L - - -    Imaging studies: No new pertinent imaging studies   Assessment/Plan: 68 y.o. male overall slowly improving with possible ileus 6 Days Post-Op s/p antrectomy with Bilroth 1 reconstruction, vagotomy, and feeding jejunostomy tube placementfor GOO and PUD.   - continue on CLD; monitor for bowel function - if passing gas and tolerating PO we can advance diet   - pain control prn; antiemetics prn  - monitor abdominal examination  - continue JP drain for now; likely will d/c prior to leaving hospital  - mobilization encouraged; PT following  - medical management per primary team    All of the above findings and recommendations were discussed with the patient, and the medical team, and all of patient's questions were answered to his expressed satisfaction.  -- Edison Simon, PA-C [facility] 03/05/2019, 7:37 AM [phone] M-F: 7am - 4pm

## 2019-03-05 NOTE — Progress Notes (Signed)
PT Cancellation Note  Patient Details Name: Keith [name] MRN: [id number] DOB: [birth year]   Cancelled Treatment:    Reason Eval/Treat Not Completed: Medical issues which prohibited therapy Pt laying in bed lights off and feeling poorly.  He was willing to talk but he had gagging/hiccups t/o the brief discussion and had multiple small spit ups in the emesis bag.  Pt reports he has felt poorly all day, felt very nauseated and nearly passed out when trying to get up to the bathroom earlier today and asks me to speak to nurse to see if he can get pain meds.  Pt clearly uncomfortable and this PT agrees with him that he does not need to be doing any physical activity at this time, today's session deferred.  Kreg Shropshire, DPT 03/05/2019, 5:20 PM

## 2019-03-05 NOTE — Progress Notes (Signed)
Patient had excessive dark green vomiting and persistent nausea despite zofran, with c/o 10 out of 10 abdominal pain. MD notified, orders given to place NG tube however patient refused. MD notified of refusal. Orders placed for KUB and phenergan PRN.

## 2019-03-06 LAB — BASIC METABOLIC PANEL
Anion gap: 8 (ref 5–15)
BUN: 10 mg/dL (ref 8–23)
CO2: 19 mmol/L — ABNORMAL LOW (ref 22–32)
Calcium: 8.5 mg/dL — ABNORMAL LOW (ref 8.9–10.3)
Chloride: 105 mmol/L (ref 98–111)
Creatinine, Ser: 0.83 mg/dL (ref 0.61–1.24)
GFR calc Af Amer: >60 mL/min (ref >60)
GFR calc non Af Amer: >60 mL/min (ref >60)
Glucose, Bld: 115 mg/dL — ABNORMAL HIGH (ref 70–99)
Potassium: 3.7 mmol/L (ref 3.5–5.1)
Sodium: 132 mmol/L — ABNORMAL LOW (ref 135–145)

## 2019-03-06 LAB — CBC
HCT: 31.9 % — ABNORMAL LOW (ref 39.0–52.0)
Hemoglobin: 11.3 g/dL — ABNORMAL LOW (ref 13.0–17.0)
MCH: 29 pg (ref 26.0–34.0)
MCHC: 35.4 g/dL (ref 30.0–36.0)
MCV: 81.8 fL (ref 80.0–100.0)
Platelets: 383 10*3/uL (ref 150–400)
RBC: 3.9 MIL/uL — ABNORMAL LOW (ref 4.22–5.81)
RDW: 15.4 % (ref 11.5–15.5)
WBC: 13.1 10*3/uL — ABNORMAL HIGH (ref 4.0–10.5)
nRBC: 0 % (ref 0.0–0.2)

## 2019-03-06 LAB — MAGNESIUM: Magnesium: 1.8 mg/dL (ref 1.7–2.4)

## 2019-03-06 MED ORDER — ALBUTEROL SULFATE (2.5 MG/3ML) 0.083% IN NEBU: 3.0000 mL | INHALATION_SOLUTION | Freq: Four times a day (QID) | RESPIRATORY_TRACT | Status: DC | PRN

## 2019-03-06 MED ORDER — LORAZEPAM 2 MG/ML IJ SOLN: 0.5000 mg | Freq: Four times a day (QID) | INTRAMUSCULAR | Status: DC | PRN

## 2019-03-06 NOTE — Progress Notes (Signed)
Physical Therapy Treatment Patient Details Name: Keith [name] MRN: [id number] DOB: [birth year] Today's Date: 03/06/2019    History of Present Illness Pt is a 68 year old male who presented to ER and admitted for management of chest, epigastric pain; noted with gastric outlet obstruction.  Status post gastrectomy, vagotomy and feeding jejunostomy (11/18).    PT Comments    Patient received in bed, talking on phone. Reports he is ready to get out of here. Agrees to PT. Patient requires no assistance for bed mobility or transfers. He ambulated 200 feet without AD and min guard. No overt LOB or unsteadiness noted. Patient dancing in his room upon return. Patient has no further PT needs at this time.     Follow Up Recommendations  No PT follow up     Equipment Recommendations  None recommended by PT    Recommendations for Other Services       Precautions / Restrictions Precautions Precautions: Fall Precaution Comments: drain Restrictions Weight Bearing Restrictions: No    Mobility  Bed Mobility Overal bed mobility: Independent                Transfers Overall transfer level: Independent                  Ambulation/Gait Ambulation/Gait assistance: Supervision Gait Distance (Feet): 200 Feet Assistive device: None Gait Pattern/deviations: Step-through pattern Gait velocity: normal Gait velocity interpretation: >4.37 ft/sec, indicative of normal walking speed General Gait Details: no LOB or significant unsteadiness noted, patient dancing in room singing " don't rock the boat"   Stairs             Wheelchair Mobility    Modified Rankin (Stroke Patients Only)       Balance Overall balance assessment: Independent   Sitting balance-Leahy Scale: Normal     Standing balance support: No upper extremity supported;During functional activity Standing balance-Leahy Scale: Normal                              Cognition  Arousal/Alertness: Awake/alert Behavior During Therapy: WFL for tasks assessed/performed Overall Cognitive Status: Within Functional Limits for tasks assessed                                        Exercises      General Comments        Pertinent Vitals/Pain Pain Assessment: No/denies pain    Home Living                      Prior Function            PT Goals (current goals can now be found in the care plan section) Acute Rehab PT Goals Patient Stated Goal: to go home today PT Goal Formulation: With patient Time For Goal Achievement: 03/14/19 Potential to Achieve Goals: Good Progress towards PT goals: Goals met/education completed, patient discharged from PT    Frequency    Min 2X/week      PT Plan      Co-evaluation              AM-PAC PT "6 Clicks" Mobility   Outcome Measure  Help needed turning from your back to your side while in a flat bed without using bedrails?: None Help needed moving from lying on your back to sitting on  the side of a flat bed without using bedrails?: None Help needed moving to and from a bed to a chair (including a wheelchair)?: None Help needed standing up from a chair using your arms (e.g., wheelchair or bedside chair)?: None Help needed to walk in hospital room?: None Help needed climbing 3-5 steps with a railing? : None 6 Click Score: 24    End of Session Equipment Utilized During Treatment: Gait belt Activity Tolerance: Patient tolerated treatment well Patient left: in bed;with call bell/phone within reach Nurse Communication: Mobility status PT Visit Diagnosis: Muscle weakness (generalized) (M62.81)     Time: 1315-1330 PT Time Calculation (min) (ACUTE ONLY): 15 min  Charges:  $Gait Training: 8-22 mins                     Lilyonna Steidle, PT, GCS 03/06/19,1:40 PM

## 2019-03-06 NOTE — Care Management Important Message (Signed)
Important Message  Patient Details  Name: Keith [name] MRN: [id number] Date of Birth: 1950/08/22   Medicare Important Message Given:  Yes     Dannette Barbara 03/06/2019, 12:09 PM

## 2019-03-06 NOTE — Progress Notes (Signed)
[facility] Hospital Day(s): 11.   Post op day(s): 7 Days Post-Op.   Interval History:  Patient seen and examined, Had issues with NV yesterday evening, made NPO, refused placement of NGT This morning, patient reports he is feeling much better. He believes his symptoms yesterday were related to the throat spray. No fever, chills. His nausea and emesis have reportedly resolved He said he ate an oatmeal pie at 0200 Reportedly passing flatus again, 1 BM in last 24 hours recorded JP remains serosanguinous  Patient is very adamant about needing to go home today   Vital signs in last 24 hours: [min-max] current  Temp:  [97.5 F (36.4 C)-98.6 F (37 C)] 98.6 F (37 C) (11/25 0453) Pulse Rate:  [66-79] 66 (11/25 0453) Resp:  [18] 18 (11/24 2128) BP: (136-168)/(93-106) 168/98 (11/25 0453) SpO2:  [94 %-97 %] 96 % (11/25 0453) Weight:  [57.2 kg] 57.2 kg (11/25 0453)     Height: 5\' 7"  (170.2 cm) Weight: 57.2 kg BMI (Calculated): 19.73   Intake/Output last 2 shifts:  11/24 0701 - 11/25 0700 In: -  Out: 3070 [Urine:800; Emesis/NG output:2200; Drains:70]   Physical Exam:  Constitutional: alert, cooperative and no distress  Respiratory: breathing non-labored at rest  Cardiovascular: regular rate and sinus rhythm  Gastrointestinal: soft, incisional tenderness much improved, and non-distended. No rebound/guarding. JP in RLQ with serosanguinous output. Jejunostomy tube in left abdomen, clogged.  Integumentary: laparotomy incision is CDI with staples, no erythema or drainage   Labs:  CBC Latest Ref Rng & Units 03/04/2019 03/03/2019 03/02/2019  WBC 4.0 - 10.5 K/uL 12.1(H) 11.1(H) 11.8(H)  Hemoglobin 13.0 - 17.0 g/dL 12.0(L) 11.4(L) 12.1(L)  Hematocrit 39.0 - 52.0 % 33.4(L) 33.2(L) 33.1(L)  Platelets 150 - 400 K/uL 329 318 286   CMP Latest Ref Rng & Units 03/06/2019 03/04/2019 03/03/2019  Glucose 70 - 99 mg/dL 115(H) 99 77  BUN 8 - 23 mg/dL 10 10  14   Creatinine 0.61 - 1.24 mg/dL 0.83 0.93 0.95  Sodium 135 - 145 mmol/L 132(L) 135 132(L)  Potassium 3.5 - 5.1 mmol/L 3.7 4.1 4.3  Chloride 98 - 111 mmol/L 105 106 102  CO2 22 - 32 mmol/L 19(L) 18(L) 20(L)  Calcium 8.9 - 10.3 mg/dL 8.5(L) 8.7(L) 8.5(L)  Total Protein 6.5 - 8.1 g/dL - - -  Total Bilirubin 0.3 - 1.2 mg/dL - - -  Alkaline Phos 38 - 126 U/L - - -  AST 15 - 41 U/L - - -  ALT 0 - 44 U/L - - -     Imaging studies:   KUB (03/05/2019) personally reviewed and agree with radiologist report reviewed:  IMPRESSION: Nonobstructive bowel gas pattern.  Postsurgical changes as above   Assessment/Plan: 68 y.o. male with resolved pain, nausea, and emesis with reports of flatus 7 Days Post-Op s/p antrectomy with Bilroth 1 reconstruction, vagotomy, and feeding jejunostomy tube placementfor GOOand PUD   - Okay to resume full liquid diet; advance if tolerates   - pain control prn; antiemetics prn             - monitor abdominal examination             - continue JP drain for now; likely will d/c prior to leaving hospital             - mobilization encouraged; PT following             - medical management per primary team     -  Discharge planning: I would be very hesitant to discharge today despite patient wishes, would like to ensure he is able to tolerate PO without issues  All of the above findings and recommendations were discussed with the patient, and the medical team, and all of patient's questions were answered to his expressed satisfaction.  -- Edison Simon, PA-C [facility] 03/06/2019, 7:36 AM [phone] M-F: 7am - 4pm

## 2019-03-06 NOTE — TOC Transition Note (Signed)
Transition of Care [facility]) - CM/SW Discharge Note   Patient Details  Name: Keith [name] MRN: [id number] Date of Birth: Feb 16, 1951  Transition of Care [facility]) CM/SW Contact:  Ross Ludwig, LCSW Phone Number: 03/06/2019, 6:56 PM   Clinical Narrative:     Patient left AMA, home health will not be ordered, CSW updated [place] at [facility]  CSW signing off.   Final next level of care: Against Medical Advice Barriers to Discharge: Continued Medical Work up   Patient Goals and CMS Choice   CMS Medicare.gov Compare Post Acute Care list provided to:: Patient Choice offered to / list presented to : Patient  Discharge Placement                       Discharge Plan and Services                                     Social Determinants of Health (SDOH) Interventions     Readmission Risk Interventions No flowsheet data found.

## 2019-03-06 NOTE — TOC Progression Note (Deleted)
Transition of Care [place] [facility]) - Progression Note    Patient Details  Name: Keith [name] MRN: [id number] Date of Birth: 09-05-1950  Transition of Care [facility]) CM/SW Contact  Ross Ludwig, [place] Phone Number: 03/06/2019, 6:55 PM  Clinical Narrative:     Patient leaving AMA, home health will not be ordered, CSW updated [place] at [facility].   Expected Discharge Plan: [place] Barriers to Discharge: Continued Medical Work up  Expected Discharge Plan and Services Expected Discharge Plan: Show Low                                               Social Determinants of Health (SDOH) Interventions    Readmission Risk Interventions No flowsheet data found.

## 2019-03-06 NOTE — Progress Notes (Signed)
Patient is leaving AMA, attending and surgery MD notified. IV and tele removed. JP drain to remain in place and patient to f/u with surgery on Monday. AMA formed signed, patient awaiting transportation.

## 2019-03-06 NOTE — Discharge Summary (Signed)
CARMICHAEL HOPPS [id number] DOB: 08/01/1950 DOA: 02/23/2019  PCP: Patient, No Pcp Per  Admit date: 02/23/2019 SIGNED OUT AMA date: 03/06/2019  Admitted From: home Disposition:  SIGNED OUT AMA  Recommendations for Outpatient Follow-up:  SURGERY ON Monday FOR [facility]:NONE   Discharge Condition:Stable CODE STATUS:FULL Diet recommendation:signed out ama  Brief/Interim Summary: 68 y.o.malewith medical history ofPUD, hypertension, a-fibnot on anticoagulationsecondary toPUD,presented withchest and epigastric pain. EGD backon 5/6/2020which showed LA grade D esophagitis, non-bleeding gastric ulcer, congested friable mucosa in the pylorus s/p epinephrine and large duodenal ulcer with duodenal stenosis.plan was for pt ot have repeat egd and he did not follow up.Patient reports attempts to be seen at [facility] but has been unable to get through to schedule. In the ED, HR 40, BP 100/76, otherwise vitals normal. Abdominal xray showed nonobstructive gas pattern. Patient admitted for further evaluation. Gastroenterology consulted. Patient underwent EGD 11/17 which showed pyloric stenosis and LA grade D reflux esophagitis without active bleeding. General surgery consulted and patient taken to surgery on 11/18for antrectomy with Bilroth 1 reconstruction, vagotomy and jejunostomy feeding tube placement. Overnight patient had excessive dark green vomiting and persistent nausea despite Zofran with complaining of 10 out of 10 abdominal pain.  NG tube was ordered however patient refused it.  This a.m. he has decided to sign out AMA as he has to go home and cook for Thanksgiving.  After much discussion he still refusing to stay in house.  I spoke to surgical PA Sharol Roussel and he stated patient should stay overnight to see if he tolerates his diet but still patient would like to sign out AMA. I discussed with the patient if he leaves he is at risk of becoming sicker and having further  issues and likely endup coming back sicker but still refuses to stay.  Discharge Diagnoses:  Principal Problem:   Acute epigastric pain Active Problems:   Atrial fibrillation (HCC)   Abdominal pain   Left-sided chest pain   Essential hypertension   Tobacco use disorder   Moderate protein-energy malnutrition (HCC)   Acute kidney injury superimposed on CKD (HCC)   Hypokalemia   Troponin level elevated   Dehydration   Acute gastritis without hemorrhage   Vitamin B12 deficiency   Intractable vomiting   Postpyloric ulcer    Discharge Instructions F/u with surgery on Monday for JP drain   Follow-up Information    Theora Gianotti, NP. Go on 03/14/2019.   Specialties: Nurse Practitioner, Cardiology, Radiology Why: APPOINTMENT AT 8:50AM Contact information: [address] [place] [address] [phone]          No Known Allergies  Consultations:  GSx   Procedures/Studies: Ct Abdomen Pelvis Wo Contrast  Result Date: 02/24/2019 CLINICAL DATA:  Upper abdominal and pelvic pain for several weeks with nausea and vomiting. EXAM: CT ABDOMEN AND PELVIS WITHOUT CONTRAST TECHNIQUE: Multidetector CT imaging of the abdomen and pelvis was performed following the standard protocol without IV contrast. COMPARISON:  Abdominal ultrasound from 02/23/2019 FINDINGS: Lower chest: Dependent atelectasis in both lower lobes. Hepatobiliary: Unremarkable Pancreas: Equivocal indistinct margins of the pancreas, likely incidental especially in light of the patient's normal lipase level today. Spleen: Unremarkable Adrenals/Urinary Tract: 1.6 cm nonspecific hypodense lesion in the right mid kidney. Enhancement characteristics are on study. No urinary tract calculi are identified. Urinary bladder unremarkable. Stomach/Bowel: The stomach is somewhat distended with contrast medium. Contrast medium makes its way through to the colon. Prominent stool throughout the colon favors  constipation. Scattered sigmoid colon diverticula. Normal appendix. Vascular/Lymphatic: Aortoiliac atherosclerotic vascular disease. No pathologic adenopathy is identified. Reproductive: Unremarkable Other: No supplemental non-categorized findings. Musculoskeletal: Unremarkable IMPRESSION: 1. A specific cause for the patient's abdominal pain is not identified. 2. Prominent stool throughout the colon favors constipation. Scattered sigmoid colon diverticula. 3. Aortic atherosclerosis. Aortic Atherosclerosis (ICD10-I70.0). Electronically Signed   By: Van Clines M.D.   On: 02/24/2019 19:52   Dg Abd 1 View  Result Date: 03/05/2019 CLINICAL DATA:  Abdominal distension EXAM: ABDOMEN - 1 VIEW COMPARISON:  None. FINDINGS: A surgical drain projects over the patient's midline abdomen. Multiple staples are noted. The bowel gas pattern is nonobstructive. A feeding tube projects over the lower midline abdomen. There is a small amount of free air likely related to recent surgical intervention. IMPRESSION: Nonobstructive bowel gas pattern.  Postsurgical changes as above. Electronically Signed   By: Constance Holster M.D.   On: 03/05/2019 20:04   US Abdomen Complete  Result Date: 02/23/2019 CLINICAL DATA:  Abdominal pain EXAM: ABDOMEN ULTRASOUND COMPLETE COMPARISON:  None. FINDINGS: Gallbladder: No gallstones or wall thickening visualized. No sonographic Murphy sign noted by sonographer. Common bile duct: Diameter: Normal caliber, 3 mm Liver: 1.6 cm echogenic area within the right hepatic lobe most consistent with hemangioma. Normal echotexture. No biliary ductal dilatation. Portal vein is patent on color Doppler imaging with normal direction of blood flow towards the liver. IVC: No abnormality visualized. Pancreas: Visualized portion unremarkable. Spleen: Size and appearance within normal limits. Right Kidney: Length: 8.9 cm. Echogenicity within normal limits. No mass or hydronephrosis visualized. Left Kidney:  Length: 10.3 cm. Echogenicity within normal limits. No mass or hydronephrosis visualized. Abdominal aorta: No aneurysm visualized. Other findings: None. IMPRESSION: No acute findings. 1.6 cm echogenic area within the right hepatic lobe most compatible with hemangioma. Electronically Signed   By: Rolm Baptise M.D.   On: 02/23/2019 21:44   Dg Chest Portable 1 View  Result Date: 02/23/2019 CLINICAL DATA:  Chest pain and near syncopal episode following a bowel movement. Nausea and vomiting for the past 2 days. Smoker. EXAM: PORTABLE CHEST 1 VIEW COMPARISON:  None. FINDINGS: Normal sized heart. Tortuous aorta. Moderate central peribronchial thickening. Hyperexpanded, clear lungs. Unremarkable bones. IMPRESSION: Moderate changes of COPD and chronic bronchitis. No acute abnormality. Electronically Signed   By: Claudie Revering M.D.   On: 02/23/2019 14:43   Dg Abd Portable 2 Views  Result Date: 02/23/2019 CLINICAL DATA:  Abdomen pain, chest pain nausea and vomiting EXAM: PORTABLE ABDOMEN - 2 VIEW COMPARISON:  None. FINDINGS: Visualized lung bases are clear. Questionable lucency at the epigastric region. Nonobstructed bowel-gas pattern. High-riding colon at the right upper quadrant. IMPRESSION: 1. Nonobstructed gas pattern. 2. Questionable lucency at the epigastric region, consider decubitus view or CT to exclude intraperitoneal air. Electronically Signed   By: Donavan Foil M.D.   On: 02/23/2019 15:20   Dg Duanne Limerick W Single Cm (sol Or Thin Ba)  Result Date: 03/04/2019 CLINICAL DATA:  Status post Billroth partial gastrectomy, assess for anastomotic leak EXAM: WATER SOLUBLE UPPER GI SERIES TECHNIQUE: Single-column upper GI series was performed using water soluble contrast. CONTRAST:  150 mL dilute Omnipaque 300 iodinated contrast COMPARISON:  None. FLUOROSCOPY TIME:  Fluoroscopy Time:  00:54 Number of Acquired Spot Images: 6 FINDINGS: 150 mL dilute Omnipaque 300 water soluble contrast was injected via nasogastric tube.  The patient was placed in right decubitus to assist with gastric emptying. The gastric remnant and proximal small bowel were opacified without evidence  of anastomotic leak. The remaining contrast was gently aspirated from the stomach. IMPRESSION: No evidence of anastomotic leak status post Billroth partial gastrectomy. Electronically Signed   By: Eddie Candle M.D.   On: 03/04/2019 10:33       Subjective: Pt seen and examined earlier this am. He states no nausea or vomiting, blamed it on antiseptic spray. Had water and tolerated. States feels "great" despite vomiting and tolerating feeding .  Discharge Exam: Vitals:   03/06/19 0453 03/06/19 0800  BP: (!) 168/98 (!) 147/96  Pulse: 66 69  Resp:  19  Temp: 98.6 F (37 C) 97.9 F (36.6 C)  SpO2: 96% 98%   Vitals:   03/05/19 2006 03/05/19 2128 03/06/19 0453 03/06/19 0800  BP:  (!) 136/93 (!) 168/98 (!) 147/96  Pulse:  75 66 69  Resp:  18  19  Temp:  98 F (36.7 C) 98.6 F (37 C) 97.9 F (36.6 C)  TempSrc:  Oral Oral Oral  SpO2: 96% 94% 96% 98%  Weight:   57.2 kg   Height:        General: Pt is alert, awake, not in acute distress Cardiovascular: RRR, S1/S2 +, no rubs, no gallops Respiratory: CTA bilaterally, no wheezing, no rhonchi Abdominal: Soft,TTP diffusely mildly, nd. JP in place Extremities: no edema, no cyanosis    The results of significant diagnostics from this hospitalization (including imaging, microbiology, ancillary and laboratory) are listed below for reference.     Microbiology: No results found for this or any previous visit (from the past 240 hour(s)).   Labs: BNP (last 3 results) No results for input(s): BNP in the last 8760 hours. Basic Metabolic Panel: Recent Labs  Lab 02/28/19 0519 03/01/19 0500 03/02/19 0506 03/03/19 0625 03/04/19 0628 03/06/19 0446  NA 132* 133* 133* 132* 135 132*  K 4.3 4.1 3.9 4.3 4.1 3.7  CL 102 103 104 102 106 105  CO2 19* 21* 19* 20* 18* 19*  GLUCOSE 105* 116* 131*  77 99 115*  BUN 11 9 12 14 10 10   CREATININE 1.26* 1.04 0.96 0.95 0.93 0.83  CALCIUM 8.3* 8.7* 8.7* 8.5* 8.7* 8.5*  MG 1.8 1.9 2.1 2.0 1.9 1.8  PHOS 3.3  --   --  4.5  --   --    Liver Function Tests: No results for input(s): AST, ALT, ALKPHOS, BILITOT, PROT, ALBUMIN in the last 168 hours. No results for input(s): LIPASE, AMYLASE in the last 168 hours. No results for input(s): AMMONIA in the last 168 hours. CBC: Recent Labs  Lab 03/01/19 0500 03/02/19 0506 03/03/19 0625 03/04/19 0628 03/06/19 0446  WBC 13.6* 11.8* 11.1* 12.1* 13.1*  NEUTROABS 11.1*  --   --  9.8*  --   HGB 12.1* 12.1* 11.4* 12.0* 11.3*  HCT 35.0* 33.1* 33.2* 33.4* 31.9*  MCV 83.7 80.3 84.5 81.5 81.8  PLT 268 286 318 329 383   Cardiac Enzymes: No results for input(s): CKTOTAL, CKMB, CKMBINDEX, TROPONINI in the last 168 hours. BNP: Invalid input(s): POCBNP CBG: No results for input(s): GLUCAP in the last 168 hours. D-Dimer No results for input(s): DDIMER in the last 72 hours. Hgb A1c No results for input(s): HGBA1C in the last 72 hours. Lipid Profile No results for input(s): CHOL, HDL, LDLCALC, TRIG, CHOLHDL, LDLDIRECT in the last 72 hours. Thyroid function studies No results for input(s): TSH, T4TOTAL, T3FREE, THYROIDAB in the last 72 hours.  Invalid input(s): FREET3 Anemia work up No results for input(s): VITAMINB12, FOLATE, FERRITIN, TIBC,  IRON, RETICCTPCT in the last 72 hours. Urinalysis    Component Value Date/Time   COLORURINE STRAW (A) 02/24/2019 1017   APPEARANCEUR CLEAR (A) 02/24/2019 1017   LABSPEC 1.008 02/24/2019 1017   PHURINE 6.0 02/24/2019 1017   GLUCOSEU NEGATIVE 02/24/2019 1017   HGBUR NEGATIVE 02/24/2019 [place] 02/24/2019 1017   KETONESUR NEGATIVE 02/24/2019 1017   PROTEINUR NEGATIVE 02/24/2019 1017   NITRITE NEGATIVE 02/24/2019 1017   LEUKOCYTESUR NEGATIVE 02/24/2019 1017   Sepsis Labs Invalid input(s): PROCALCITONIN,  WBC,   LACTICIDVEN Microbiology No results found for this or any previous visit (from the past 240 hour(s)).   Time coordinating discharge: Over 45 minutes.  SIGNED:   Nolberto Hanlon, MD  [facility] 03/06/2019, 3:53 PM Pager

## 2019-03-09 ENCOUNTER — Inpatient Hospital Stay
Admission: EM | Admit: 2019-03-09 | Discharge: 2019-03-12 | DRG: 389 | Disposition: A | Discharge status: Home / Self Care | Payer: Medicare HMO | Attending: Internal Medicine | Admitting: Internal Medicine

## 2019-03-09 ENCOUNTER — Other Ambulatory Visit: Payer: Self-pay

## 2019-03-09 ENCOUNTER — Encounter: Payer: Self-pay | Admitting: Emergency Medicine

## 2019-03-09 ENCOUNTER — Emergency Department: Payer: Medicare HMO

## 2019-03-09 DIAGNOSIS — F17210 Nicotine dependence, cigarettes, uncomplicated: Secondary | ICD-10-CM | POA: Diagnosis present

## 2019-03-09 DIAGNOSIS — E871 Hypo-osmolality and hyponatremia: Secondary | ICD-10-CM | POA: Diagnosis present

## 2019-03-09 DIAGNOSIS — J449 Chronic obstructive pulmonary disease, unspecified: Secondary | ICD-10-CM | POA: Diagnosis present

## 2019-03-09 DIAGNOSIS — E860 Dehydration: Secondary | ICD-10-CM | POA: Diagnosis present

## 2019-03-09 DIAGNOSIS — K9189 Other postprocedural complications and disorders of digestive system: Secondary | ICD-10-CM | POA: Diagnosis not present

## 2019-03-09 DIAGNOSIS — R112 Nausea with vomiting, unspecified: Secondary | ICD-10-CM | POA: Diagnosis not present

## 2019-03-09 DIAGNOSIS — Z20828 Contact with and (suspected) exposure to other viral communicable diseases: Secondary | ICD-10-CM | POA: Diagnosis present

## 2019-03-09 DIAGNOSIS — D72829 Elevated white blood cell count, unspecified: Secondary | ICD-10-CM | POA: Diagnosis present

## 2019-03-09 DIAGNOSIS — K567 Ileus, unspecified: Secondary | ICD-10-CM | POA: Diagnosis present

## 2019-03-09 DIAGNOSIS — K858 Other acute pancreatitis without necrosis or infection: Secondary | ICD-10-CM | POA: Diagnosis not present

## 2019-03-09 DIAGNOSIS — I480 Paroxysmal atrial fibrillation: Secondary | ICD-10-CM | POA: Diagnosis present

## 2019-03-09 DIAGNOSIS — Z903 Acquired absence of stomach [part of]: Secondary | ICD-10-CM

## 2019-03-09 DIAGNOSIS — N182 Chronic kidney disease, stage 2 (mild): Secondary | ICD-10-CM | POA: Diagnosis present

## 2019-03-09 DIAGNOSIS — I129 Hypertensive chronic kidney disease with stage 1 through stage 4 chronic kidney disease, or unspecified chronic kidney disease: Secondary | ICD-10-CM | POA: Diagnosis present

## 2019-03-09 DIAGNOSIS — E861 Hypovolemia: Secondary | ICD-10-CM | POA: Diagnosis present

## 2019-03-09 DIAGNOSIS — N179 Acute kidney failure, unspecified: Secondary | ICD-10-CM | POA: Diagnosis present

## 2019-03-09 DIAGNOSIS — E876 Hypokalemia: Secondary | ICD-10-CM | POA: Diagnosis present

## 2019-03-09 DIAGNOSIS — Z7901 Long term (current) use of anticoagulants: Secondary | ICD-10-CM

## 2019-03-09 LAB — URINALYSIS, ROUTINE W REFLEX MICROSCOPIC
Bilirubin Urine: NEGATIVE
Glucose, UA: NEGATIVE mg/dL
Ketones, ur: NEGATIVE mg/dL
Leukocytes,Ua: NEGATIVE
Nitrite: NEGATIVE
Protein, ur: 30 mg/dL — AB
Specific Gravity, Urine: 1.016 (ref 1.005–1.030)
pH: 5 (ref 5.0–8.0)

## 2019-03-09 LAB — COMPREHENSIVE METABOLIC PANEL
ALT: 12 U/L (ref 0–44)
AST: 19 U/L (ref 15–41)
Albumin: 5.3 g/dL — ABNORMAL HIGH (ref 3.5–5.0)
Alkaline Phosphatase: 101 U/L (ref 38–126)
Anion gap: 26 — ABNORMAL HIGH (ref 5–15)
BUN: 63 mg/dL — ABNORMAL HIGH (ref 8–23)
CO2: 30 mmol/L (ref 22–32)
Calcium: 10.4 mg/dL — ABNORMAL HIGH (ref 8.9–10.3)
Chloride: 75 mmol/L — ABNORMAL LOW (ref 98–111)
Creatinine, Ser: 6.34 mg/dL — ABNORMAL HIGH (ref 0.61–1.24)
GFR calc Af Amer: 10 mL/min — ABNORMAL LOW (ref >60)
GFR calc non Af Amer: 8 mL/min — ABNORMAL LOW (ref >60)
Glucose, Bld: 119 mg/dL — ABNORMAL HIGH (ref 70–99)
Potassium: 3.4 mmol/L — ABNORMAL LOW (ref 3.5–5.1)
Sodium: 131 mmol/L — ABNORMAL LOW (ref 135–145)
Total Bilirubin: 0.7 mg/dL (ref 0.3–1.2)
Total Protein: 10.9 g/dL — ABNORMAL HIGH (ref 6.5–8.1)

## 2019-03-09 LAB — LACTIC ACID, PLASMA: Lactic Acid, Venous: 2.2 mmol/L (ref 0.5–1.9)

## 2019-03-09 LAB — CBC
HCT: 44 % (ref 39.0–52.0)
HCT: 35.8 % — ABNORMAL LOW (ref 39.0–52.0)
Hemoglobin: 15.3 g/dL (ref 13.0–17.0)
Hemoglobin: 12.5 g/dL — ABNORMAL LOW (ref 13.0–17.0)
MCH: 28.4 pg (ref 26.0–34.0)
MCH: 28.8 pg (ref 26.0–34.0)
MCHC: 34.8 g/dL (ref 30.0–36.0)
MCHC: 34.9 g/dL (ref 30.0–36.0)
MCV: 81.8 fL (ref 80.0–100.0)
MCV: 82.5 fL (ref 80.0–100.0)
Platelets: 684 10*3/uL — ABNORMAL HIGH (ref 150–400)
Platelets: 545 10*3/uL — ABNORMAL HIGH (ref 150–400)
RBC: 5.38 MIL/uL (ref 4.22–5.81)
RBC: 4.34 MIL/uL (ref 4.22–5.81)
RDW: 14.1 % (ref 11.5–15.5)
RDW: 14.1 % (ref 11.5–15.5)
WBC: 24.4 10*3/uL — ABNORMAL HIGH (ref 4.0–10.5)
WBC: 25.5 10*3/uL — ABNORMAL HIGH (ref 4.0–10.5)
nRBC: 0 % (ref 0.0–0.2)
nRBC: 0 % (ref 0.0–0.2)

## 2019-03-09 LAB — TSH: TSH: 6.872 u[IU]/mL — ABNORMAL HIGH (ref 0.350–4.500)

## 2019-03-09 LAB — CREATININE, SERUM
Creatinine, Ser: 5.73 mg/dL — ABNORMAL HIGH (ref 0.61–1.24)
GFR calc Af Amer: 11 mL/min — ABNORMAL LOW (ref >60)
GFR calc non Af Amer: 9 mL/min — ABNORMAL LOW (ref >60)

## 2019-03-09 LAB — BLOOD GAS, ARTERIAL
Acid-Base Excess: 4.2 mmol/L — ABNORMAL HIGH (ref 0.0–2.0)
Bicarbonate: 28.4 mmol/L — ABNORMAL HIGH (ref 20.0–28.0)
FIO2: 0.21
O2 Saturation: 96 %
Patient temperature: 37
pCO2 arterial: 40 mmHg (ref 32.0–48.0)
pH, Arterial: 7.46 — ABNORMAL HIGH (ref 7.350–7.450)
pO2, Arterial: 77 mmHg — ABNORMAL LOW (ref 83.0–108.0)

## 2019-03-09 LAB — HEMOGLOBIN A1C
Hgb A1c MFr Bld: 6.4 % — ABNORMAL HIGH (ref 4.8–5.6)
Mean Plasma Glucose: 136.98 mg/dL

## 2019-03-09 LAB — LIPASE, BLOOD: Lipase: 386 U/L — ABNORMAL HIGH (ref 11–51)

## 2019-03-09 LAB — SODIUM, URINE, RANDOM: Sodium, Ur: 44 mmol/L

## 2019-03-09 LAB — PROCALCITONIN: Procalcitonin: 0.56 ng/mL

## 2019-03-09 LAB — CREATININE, URINE, RANDOM: Creatinine, Urine: 227 mg/dL

## 2019-03-09 MED ORDER — INSULIN ASPART 100 UNIT/ML ~~LOC~~ SOLN: 0.0000 [IU] | Freq: Three times a day (TID) | SUBCUTANEOUS | Status: DC

## 2019-03-09 MED ORDER — ALBUTEROL SULFATE (2.5 MG/3ML) 0.083% IN NEBU: 2.5000 mg | INHALATION_SOLUTION | Freq: Four times a day (QID) | RESPIRATORY_TRACT | Status: DC | PRN

## 2019-03-09 MED ORDER — SODIUM CHLORIDE 0.9 % IV SOLN
INTRAVENOUS | Status: AC
  Administered 2019-03-09 – 2019-03-10 (×4): via INTRAVENOUS

## 2019-03-09 MED ORDER — ONDANSETRON HCL 4 MG/2ML IJ SOLN
4.0000 mg | Freq: Four times a day (QID) | INTRAMUSCULAR | Status: DC | PRN
  Administered 2019-03-10: 4 mg via INTRAVENOUS
  Filled 2019-03-09: qty 2

## 2019-03-09 MED ORDER — HEPARIN SODIUM (PORCINE) 5000 UNIT/ML IJ SOLN
5000.0000 [IU] | Freq: Three times a day (TID) | INTRAMUSCULAR | Status: DC
  Administered 2019-03-09 – 2019-03-12 (×8): 5000 [IU] via SUBCUTANEOUS
  Filled 2019-03-09 (×8): qty 1

## 2019-03-09 MED ORDER — ONDANSETRON HCL 4 MG/2ML IJ SOLN
4.0000 mg | Freq: Once | INTRAMUSCULAR | Status: AC
  Administered 2019-03-09: 4 mg via INTRAVENOUS
  Filled 2019-03-09: qty 2

## 2019-03-09 MED ORDER — ACETAMINOPHEN 650 MG RE SUPP: 650.0000 mg | Freq: Four times a day (QID) | RECTAL | Status: DC | PRN

## 2019-03-09 MED ORDER — ACETAMINOPHEN 325 MG PO TABS
650.0000 mg | ORAL_TABLET | Freq: Four times a day (QID) | ORAL | Status: DC | PRN
  Administered 2019-03-11 – 2019-03-12 (×2): 650 mg via ORAL
  Filled 2019-03-09: qty 2

## 2019-03-09 MED ORDER — NICOTINE 21 MG/24HR TD PT24
21.0000 mg | MEDICATED_PATCH | Freq: Every day | TRANSDERMAL | Status: DC
  Administered 2019-03-09 – 2019-03-12 (×4): 21 mg via TRANSDERMAL
  Filled 2019-03-09 (×4): qty 1

## 2019-03-09 MED ORDER — SODIUM CHLORIDE 0.9 % IV BOLUS
1000.0000 mL | Freq: Once | INTRAVENOUS | Status: AC
  Administered 2019-03-09: 1000 mL via INTRAVENOUS

## 2019-03-09 MED ORDER — ONDANSETRON HCL 4 MG PO TABS: 4.0000 mg | ORAL_TABLET | Freq: Four times a day (QID) | ORAL | Status: DC | PRN

## 2019-03-09 MED ORDER — IOHEXOL 9 MG/ML PO SOLN
500.0000 mL | Freq: Two times a day (BID) | ORAL | Status: DC | PRN
  Administered 2019-03-09 (×2): 500 mL via ORAL
  Filled 2019-03-09 (×2): qty 500

## 2019-03-09 MED ORDER — MORPHINE SULFATE (PF) 4 MG/ML IV SOLN
4.0000 mg | Freq: Once | INTRAVENOUS | Status: AC
  Administered 2019-03-09: 4 mg via INTRAVENOUS
  Filled 2019-03-09: qty 1

## 2019-03-09 MED ORDER — SODIUM CHLORIDE 0.9 % IV SOLN
Freq: Once | INTRAVENOUS | Status: AC
  Administered 2019-03-09: 20:00:00 via INTRAVENOUS

## 2019-03-09 NOTE — H&P (Addendum)
History and Physical    MARCHELLO ROHAL [id number] DOB: [birth year] DOA: 03/09/2019  PCP: Patient, No Pcp Per  Patient coming from: ED  I have personally briefly reviewed patient's old medical records in [place]  Chief Complaint:   Refractory nausea and vomiting   HPI: CARBON TREADWAY is a 68 y.o. male with medical history significant for P Afib not on anticoagulation, tobacco abuse, history of cocaine abuse in remission x 6 mo, hypertension, gastritis and gastric ulcers, status post recent antrectomy with Bilroth 1 reconstruction, vagotomy and jejunostomy feeding tube placement on 11/18. Patient was discharged on 03/06/19 against medical advice due to poor ability to tolerate po.  Patient now returns to ed with progressive symptoms of nausea/vomitting and inability to tolerate po. Per patient states 1st two days at home went well. However the night of the third day home he began to have significant nausea/vomitting as well as loose stools. He stated am of presentation symptoms become severe so he presented to the ED. He notes that he has continued to make stool with last bm being this am. He also states that he has continued to make urine as well. He notes abdominal discomfort associated with his refractory nausea and vomiting as well as surgical incisions. He denies fever,chills, palpitations, chest pain , or current shortness of breath.   ED Course:  In ED: Vitals 101/65, pulse is 76 respiratory 20 temperature 97.7 sats 90 sat 95% on room air. Labs include creatinine of 6.34 up from 0.83 lipase of 386 white count of 24.4 up from 13.1 hemoglobin concentrated at 15.3 up from 11.3 platelets 684 up from 383. CT scan: Suggestive of small bowel ileus otherwise postsurgical changes no other acute findings.  Please see full report below Surgery consult Dr. Lysle Pearl was called who has agreed to see patient in the ED and will leave final recommendations. Currently patient is stable and will be  admitted to hospitalist service under telemetry floor.   Review of Systems: As per HPI otherwise 10 point review of systems negative. + weight loss, no HA, no focal weakness, no blood in stool or emesis. + chronic skin condition, + cold intolerance.   Past Medical History:  Diagnosis Date  . CKD (chronic kidney disease), stage II   . Cocaine abuse ([place])   . Duodenal ulcer   . Gastric ulcer   . Hypertension   . Noncompliance   . Paroxysmal atrial fibrillation (H[facility])   . Tobacco abuse     Past Surgical History:  Procedure Laterality Date  . ESOPHAGOGASTRODUODENOSCOPY (EGD) WITH PROPOFOL N/A 02/26/2019   Procedure: ESOPHAGOGASTRODUODENOSCOPY (EGD) WITH PROPOFOL;  Surgeon: Lucilla Lame, MD;  Location[facility] ENDOSCOPY;  Service: Endoscopy;  Laterality: N/A;  . GASTROJEJUNOSTOMY N/A 02/27/2019   Procedure: GASTROJEJUNOSTOMY;  Surgeon: Jules Husbands, MD;  Location: [facility] ORS;  Service: General;  Laterality: N/A;     reports that he has been smoking cigarettes. He does not have any smokeless tobacco history on file. He reports previous alcohol use. No history on file for drug. He notes last tobacco use on thanksgiving. He denies cocaine use x 23mo's, he also denies ETOH abuse time 30 years.  No Known Allergies  Family History  Problem Relation Age of Onset  . Diabetes Mother   . Hypertension Mother   . CAD Father   . Diabetes Sister   . Hypertension Sister   . Kidney failure Sister    Prior to Admission medications   Not on File  Physical Exam: Vitals:   03/09/19 1730 03/09/19 1800 03/09/19 1830 03/09/19 1930  BP: (!) 139/108 115/83 (!) 123/96 119/80  Pulse: (!) 49 73 (!) 59 69  Resp: 15 15 15    Temp:      TempSrc:      SpO2: 96% 94% 97% 100%  Weight:      Height:        Constitutional: NAD, calm, comfortable Vitals:   03/09/19 1730 03/09/19 1800 03/09/19 1830 03/09/19 1930  BP: (!) 139/108 115/83 (!) 123/96 119/80  Pulse: (!) 49 73 (!) 59 69  Resp: 15 15 15     Temp:      TempSrc:      SpO2: 96% 94% 97% 100%  Weight:      Height:       Eyes: PERRL, lids and conjunctivae normal ENMT: Mucous membranes are moist. Posterior pharynx clear of any exudate or lesions.Normal dentition.  Neck: normal, supple, no masses, no thyromegaly Respiratory: clear to auscultation bilaterally, no wheezing, no crackles. Normal respiratory effort. No accessory muscle use.  Cardiovascular: Regular rate and rhythm, no murmurs / rubs / gallops. No extremity edema. 2+ pedal pulses. No carotid bruits.  Abdomen: Abdomen is flat nondistended , surgical incisions/staple line intact , drains noted intact ,positive bowel sounds in all quadrants, appropriate incisional tenderness ,no masses palpated. No hepatosplenomegaly. Musculoskeletal: no clubbing / cyanosis. No joint deformity upper and lower extremities. Good ROM, no contractures. Normal muscle tone.  Skin: Chronic eczematous rash, no ulcers. No induration Neurologic: CN 2-12 grossly intact. Sensation intact, DTR normal. Strength 5/5 in all 4.  Psychiatric: Normal judgment and insight. Alert and oriented x 3. Normal mood.    Labs on Admission: I have personally reviewed following labs and imaging studies  CBC: Recent Labs  Lab 03/03/19 0625 03/04/19 0628 03/06/19 0446 03/09/19 1547  WBC 11.1* 12.1* 13.1* 24.4*  NEUTROABS  --  9.8*  --   --   HGB 11.4* 12.0* 11.3* 15.3  HCT 33.2* 33.4* 31.9* 44.0  MCV 84.5 81.5 81.8 81.8  PLT 318 329 383 [id number]*   Basic Metabolic Panel: Recent Labs  Lab 03/03/19 0625 03/04/19 0628 03/06/19 0446 03/09/19 1547  NA 132* 135 132* 131*  K 4.3 4.1 3.7 3.4*  CL 102 106 105 75*  CO2 20* 18* 19* 30  GLUCOSE 77 99 115* 119*  BUN 14 10 10  63*  CREATININE 0.95 0.93 0.83 6.34*  CALCIUM 8.5* 8.7* 8.5* 10.4*  MG 2.0 1.9 1.8  --   PHOS 4.5  --   --   --    GFR: Estimated Creatinine Clearance: 9 mL/min (A) (by C-G formula based on SCr of 6.34 mg/dL (H)). Liver Function Tests: Recent  Labs  Lab 03/09/19 1547  AST 19  ALT 12  ALKPHOS 101  BILITOT 0.7  PROT 10.9*  ALBUMIN 5.3*   Recent Labs  Lab 03/09/19 1547  LIPASE 386*   No results for input(s): AMMONIA in the last 168 hours. Coagulation Profile: No results for input(s): INR, PROTIME in the last 168 hours. Cardiac Enzymes: No results for input(s): CKTOTAL, CKMB, CKMBINDEX, TROPONINI in the last 168 hours. BNP (last 3 results) No results for input(s): PROBNP in the last 8760 hours. HbA1C: No results for input(s): HGBA1C in the last 72 hours. CBG: No results for input(s): GLUCAP in the last 168 hours. Lipid Profile: No results for input(s): CHOL, HDL, LDLCALC, TRIG, CHOLHDL, LDLDIRECT in the last 72 hours. Thyroid Function Tests: No results for  input(s): TSH, T4TOTAL, FREET4, T3FREE, THYROIDAB in the last 72 hours. Anemia Panel: No results for input(s): VITAMINB12, FOLATE, FERRITIN, TIBC, IRON, RETICCTPCT in the last 72 hours. Urine analysis:    Component Value Date/Time   COLORURINE STRAW (A) 02/24/2019 1017   APPEARANCEUR CLEAR (A) 02/24/2019 1017   LABSPEC 1.008 02/24/2019 1017   PHURINE 6.0 02/24/2019 1017   GLUCOSEU NEGATIVE 02/24/2019 1017   HGBUR NEGATIVE 02/24/2019 1017   BILIRUBINUR NEGATIVE 02/24/2019 [place] 02/24/2019 1017   PROTEINUR NEGATIVE 02/24/2019 1017   NITRITE NEGATIVE 02/24/2019 1017   LEUKOCYTESUR NEGATIVE 02/24/2019 1017    Radiological Exams on Admission: Ct Abdomen Pelvis Wo Contrast  Result Date: 03/09/2019 CLINICAL DATA:  68 year old male with recent gastric surgery (Billroth 1) presenting with vomiting. EXAM: CT ABDOMEN AND PELVIS WITHOUT CONTRAST TECHNIQUE: Multidetector CT imaging of the abdomen and pelvis was performed following the standard protocol without IV contrast. COMPARISON:  Abdominal radiograph dated 03/05/2019 and CT dated 02/24/2019 FINDINGS: Evaluation of this exam is limited in the absence of intravenous contrast. Lower chest:  Emphysematous changes of the visualized lung bases. No intra-abdominal free air or free fluid. Hepatobiliary: The liver is unremarkable. No intrahepatic biliary ductal dilatation. There is layering sludge within the gallbladder. No pericholecystic fluid or evidence of acute cholecystitis. Pancreas: Unremarkable. No pancreatic ductal dilatation or surrounding inflammatory changes. Spleen: Normal in size without focal abnormality. Adrenals/Urinary Tract: The adrenal glands are unremarkable. There is no hydronephrosis or nephrolithiasis on either side. A 12 mm hypodense lesion in the interpolar aspect of the right kidney is not well characterized but may represent a cyst. The visualized ureters appear unremarkable. The urinary bladder is grossly unremarkable. Stomach/Bowel: Postsurgical changes of partial gastrectomy and gastroduodenostomy. No evidence of obstruction at the gastrojejunostomy. Mildly dilated proximal small bowel loops without evidence of obstruction most likely represent an ileus. A percutaneous jejunostomy tube is noted. There is scattered sigmoid diverticula without active inflammatory changes. The appendix is normal. Vascular/Lymphatic: Moderate atherosclerotic calcification of the aorta. The IVC is unremarkable. No portal venous gas. There is no adenopathy. A percutaneous drainage tube is noted with tip in the left anterior abdomen anterior to the stomach. No drainable fluid collection. Reproductive: The prostate and seminal vesicles are grossly unremarkable. No pelvic mass. Other: Midline vertical anterior abdominal wall surgical incision and cutaneous clips. No fluid collection. Musculoskeletal: No acute or significant osseous findings. IMPRESSION: 1. Postsurgical changes of partial gastrectomy and gastroduodenostomy. No evidence of obstruction at the gastrojejunostomy. 2. Mildly dilated proximal small bowel likely ileus. No evidence of obstruction. 3. Colonic diverticulosis. Normal appendix. 4.  Aortic Atherosclerosis (ICD10-I70.0). Electronically Signed   By: Anner Crete M.D.   On: 03/09/2019 18:34    EKG: Independently reviewed.  Sinus bradycardia nonspecific T wave changes noted there is a poor baseline, LVH  Assessment/Plan Active Problems:   Ileus following gastrointestinal surgery [facility])   Patient is a 68 year old male with past medical history is significant for paroxysmal atrial fibrillation on anticoagulation hypertension gastritis and gastric ulcer status post recent antrectomy with Billroth I reconstruction vagotomy and jejunostomy tube placement on 1118.  Patient signed out [place] on 03/06/2019.  Patient now returns with around 24 hours of progressive nausea and vomiting and inability to tolerate p.o. on evaluation in the ED patient noted to have stable vital signs laboratory data noted acute renal failure with creatinine increasing from 0.83 to 6.34.  Other labs were suggestive of hemoconcentration with elevation in white count to 24  up from 13 and hemoglobin at 15 up from 11.  Of note patient had a CT scan done at which noted no acute obstruction but was suggestive of a small bowel ileus.  Case was discussed with Dr. Lysle Pearl who will see patient in ED at this time patient is being admitted to hospitalist service for treatment of postgastrectomy ileus with sequelae of refractory nausea and vomiting and severe dehydration leading to acute renal failure.   PUD/ulcer s/p  Gastrectomy with sequela of ileus vs post gastrectomy dumping syndrome. -N.p.o. status, IV fluids, antiemetics, supportive care -Follow-up on surgical consultation recommendations -GI prophylaxis with PPI  Acute renal failure due to severe dehydration related to refractory nausea and vomiting -Associated anion gap: check lactic acid/abg to be complete -Hold all nephrotoxic medications -Check urine sodium and urine creatinine -IV fluids -Strict I's and O's Place Foley catheter -Renal  consult -CT scan noted no renal pathology   Elevated lipase - CT scan is negative for any pancreatic inflammation -Elevation most likely related to the current GI pathology and bowel inflammation/nausea and vomiting -Continue to monitor labs  Hypertension -Currently stable -Resume medications as blood pressure tolerates once med rec has been completed  Tobacco abuse -nicotine patch per patient request -Encourage cessation   Paroxysmal atrial fibrillation -Patient not on anticoagulation/due to history of bleeding ulcers -Currently patient is in sinus   History of asthma/COPD -Patient notes a history of asthma/copd -No recent flares -Supportive care with  Nebs prn  Fluids electrolytes nutrition:  Npo/ice chips ok ivfs at 150 cc/h Hypokalemia replete prn   DVT prophylaxis: Heparin subcu  Code Status: Full code  Family Communication:  I discussed care with patient and close friend Ollen Bowl.  Disposition Plan: expected to be admitted greater than 2 midnights  Consults called: Nephrology( Dr Driscilla Moats), general surgery Lysle Pearl) Admission status: inpatient tele  Clance Boll MD [facility] Pager [phone]  If 7PM-7AM, please contact night-coverage [email or web address] Password [facility]  03/09/2019, 7:53 PM

## 2019-03-09 NOTE — ED Triage Notes (Signed)
FIRST NURSE NOTE: Pt recently discharged from hospital on Wednesday, c/o patient being weak and unable to speak.      [phone] Angela Nevin Palmer-Daughter)

## 2019-03-09 NOTE — ED Notes (Signed)
Lab called for venipuncture assist. Iv flushes but will not draw back.

## 2019-03-09 NOTE — ED Provider Notes (Signed)
[facility] Emergency Department Provider Note  Time seen: 3:34 PM  I have reviewed the triage vital signs and the nursing notes.   HISTORY  Chief Complaint Weakness Nausea vomiting   HPI Keith [name] is a 68 y.o. male with a past medical history of CKD, hypertension, gastritis and gastric ulcers, status post recent antrectomy with Billroth 1 reconstruction approximately 4 days ago presents to the emergency department for nausea vomiting.  In reading the patient's recent discharge summary patient had an antrectomy and Billroth I reconstruction due to pyloric stenosis and significant ulcer disease.  Patient was noted to have emesis following the surgery however the patient left AMA prior to resolution so he could go home and cook Thanksgiving meal.  Patient states he did okay Thursday however beginning yesterday he became very nauseated and cannot keep anything down.  States today he has not been able to keep anything down.  Patient is having normal bowel movements last of which occurred this morning.  States he is still creating normal amount of urine.  Today he is feeling very weak and feels like he is dehydrated, and also experiencing abdominal spasms at times per patient.  Has a JP drain in place.   Past Medical History:  Diagnosis Date  . CKD (chronic kidney disease), stage II   . Cocaine abuse ([place])   . Duodenal ulcer   . Gastric ulcer   . Hypertension   . Noncompliance   . Paroxysmal atrial fibrillation (HCC)   . Tobacco abuse     Patient Active Problem List   Diagnosis Date Noted  . Intractable vomiting   . Postpyloric ulcer   . Vitamin B12 deficiency 02/25/2019  . Acute epigastric pain 02/24/2019  . Dehydration   . Acute gastritis without hemorrhage   . Abdominal pain 02/23/2019  . Left-sided chest pain 02/23/2019  . Moderate protein-energy malnutrition ([place]) 02/23/2019  . Acute kidney injury superimposed on CKD ([place]) 02/23/2019  . Hypokalemia  02/23/2019  . Troponin level elevated 02/23/2019  . Peptic ulcer disease 08/16/2018  . Cocaine use disorder ([place]) 08/15/2018  . Teeth missing, unspecified edentulism 08/15/2018  . Atrial fibrillation ([place]) 08/14/2018  . Tobacco use disorder 05/31/2018  . Essential hypertension 07/25/2017  . Duodenal stricture 07/25/2017  . Unavailability of medical care 03/12/2016    Past Surgical History:  Procedure Laterality Date  . ESOPHAGOGASTRODUODENOSCOPY (EGD) WITH PROPOFOL N/A 02/26/2019   Procedure: ESOPHAGOGASTRODUODENOSCOPY (EGD) WITH PROPOFOL;  Surgeon: Lucilla Lame, MD;  Location: [facility] ENDOSCOPY;  Service: Endoscopy;  Laterality: N/A;  . GASTROJEJUNOSTOMY N/A 02/27/2019   Procedure: GASTROJEJUNOSTOMY;  Surgeon: Jules Husbands, MD;  Location: [facility] ORS;  Service: General;  Laterality: N/A;    Prior to Admission medications   Medication Sig Start Date End Date Taking? Authorizing Provider  albuterol (VENTOLIN HFA) 108 (90 Base) MCG/ACT inhaler Inhale 2 puffs into the lungs every 6 (six) hours as needed. 08/16/18 08/16/19  [provider]  amLODipine (NORVASC) 10 MG tablet Take 10 mg by mouth daily. 08/16/18 08/16/19  [provider]    No Known Allergies  Family History  Problem Relation Age of Onset  . Diabetes Mother   . Hypertension Mother   . CAD Father   . Diabetes Sister   . Hypertension Sister   . Kidney failure Sister     Social History Social History   Tobacco Use  . Smoking status: Current Every Day Smoker    Types: Cigarettes  Substance Use Topics  .  Alcohol use: Not Currently  . Drug use: Not on file    Review of Systems Constitutional: Negative for fever.  Generalized fatigue/weakness.   Cardiovascular: Negative for chest pain. Respiratory: Negative for shortness of breath. Gastrointestinal: Intermittent abdominal spasms, moderate.  Positive for nausea and vomiting at times.  Normal bowel movement this morning. Genitourinary: Negative for urinary  compaints Musculoskeletal: Negative for musculoskeletal complaints Neurological: Negative for headache All other ROS negative  ____________________________________________   PHYSICAL EXAM:  VITAL SIGNS: ED Triage Vitals  Enc Vitals Group     BP 03/09/19 1454 101/65     Pulse Rate 03/09/19 1454 76     Resp 03/09/19 1454 20     Temp 03/09/19 1454 97.7 F (36.5 C)     Temp Source 03/09/19 1454 Oral     SpO2 03/09/19 1454 95 %     Weight 03/09/19 1459 126 lb (57.2 kg)     Height 03/09/19 1459 5\' 7"  (1.702 m)     Head Circumference --      Peak Flow --      Pain Score 03/09/19 1459 10     Pain Loc --      Pain Edu? --      Excl. in [place]? --    Constitutional: Alert and oriented. Well appearing and in no distress. Eyes: Normal exam ENT      Head: Normocephalic and atraumatic.      Mouth/Throat: Mucous membranes are moist. Cardiovascular: Normal rate, regular rhythm.  Respiratory: Normal respiratory effort without tachypnea nor retractions. Breath sounds are clear  Gastrointestinal: Soft and nontender. No distention.   Musculoskeletal: Nontender with normal range of motion in all extremities.  Neurologic:  Normal speech and language. No gross focal neurologic deficits  Skin:  Skin is warm, dry and intact.  Psychiatric: Mood and affect are normal.   ____________________________________________   INITIAL IMPRESSION / ASSESSMENT AND PLAN / ED COURSE  Pertinent labs & imaging results that were available during my care of the patient were reviewed by me and considered in my medical decision making (see chart for details).   Patient status post recent antrectomy with Billroth I reconstruction approximately 4 days ago presents to the emergency department generalized fatigue nausea and vomiting.  Differential would include dehydration, surgical complication, electrolyte or metabolic abnormality, less likely obstructive pathology.  We will check labs, IV hydrate treat pain nausea and  obtain CT imaging to further evaluate.  Will likely discuss with surgery once results are known.  CT scan shows what appears to be most consistent with ileus.  Labs show an elevated lipase which could be inflammatory due to recent surgery.  Patient does have a significant leukocytosis of 24,000,, most concerning is the patient's creatinine has gone from 0.8-6.3 with an anion gap of 26 consistent with significant dehydration likely due to the ileus and continued nausea vomiting over the past several days.  We will continue with IV hydration.  I discussed the patient with Dr. Lysle Pearl of general surgery, who is aware and will be consulting on the patient.  We will discuss with the hospitalist for admission.  EKG viewed and interpreted by myself shows a sinus rhythm at 53 bpm with a narrow QRS, normal axis, largely normal intervals, nonspecific ST changes but no ST elevation.  Keith [name] was evaluated in Emergency Department on 03/09/2019 for the symptoms described in the history of present illness. He was evaluated in the context of the global COVID-19 pandemic, which necessitated consideration  that the patient might be at risk for infection with the SARS-CoV-2 virus that causes COVID-19. Institutional protocols and algorithms that pertain to the evaluation of patients at risk for COVID-19 are in a state of rapid change based on information released by regulatory bodies including the CDC and federal and state organizations. These policies and algorithms were followed during the patient's care in the ED.  ____________________________________________   FINAL CLINICAL IMPRESSION(S) / ED DIAGNOSES  Weakness Nausea vomiting Acute renal failure Ileus   Harvest Dark, MD 03/09/19 1916

## 2019-03-09 NOTE — ED Triage Notes (Addendum)
States was discharged from hospital Wednesday. States has been weak since. States has surgery while in hospital. Has midline healing incision with staples intact, has JP to R abdomen with serosanguinous fluid noted. Has feeding tube to L abdomen which is plugged at this time. Patient states he has not been able to use tube feelings since his discharge due to being plugged. States has tried po fluids but vomits. Skin turgor poor.

## 2019-03-09 NOTE — Consult Note (Addendum)
Subjective:   CC: Nausea vomiting, status post Billroth I  HPI:  Keith [name] is a 68 y.o. male who was consulted by [facility] for evaluation of above.  Recent history of Billroth I operation secondary to peptic ulcer disease causing stricture and perforation.  Patient left AMA before fully recovering for it, with issues of some nausea and vomiting when he left hospital to cook dinner for Thanksgiving.  Patient states he was able to keep some food down a couple days ago, but started to have some nausea and vomiting yesterday.  Abdominal pain followed the vomiting episodes.  Overall this patient states that he has been maintaining good urine output as well as regular bowel movements, last one being earlier today.  Only other complaint he has was persistent throat pain and hiccups that started shortly before nausea vomiting episodes.  Past Medical History:  has a past medical history of CKD (chronic kidney disease), stage II, Cocaine abuse ([place]), Duodenal ulcer, Gastric ulcer, Hypertension, Noncompliance, Paroxysmal atrial fibrillation ([place]), and Tobacco abuse.  Past Surgical History:  has a past surgical history that includes Gastrojejunostomy (N/A, 02/27/2019) and Esophagogastroduodenoscopy (egd) with propofol (N/A, 02/26/2019).  Family History: family history includes CAD in his father; Diabetes in his mother and sister; Hypertension in his mother and sister; Kidney failure in his sister.  Social History:  reports that he has been smoking cigarettes. He does not have any smokeless tobacco history on file. He reports previous alcohol use. No history on file for drug.  Current Medications: None reported  Allergies:  No Known Allergies  ROS:  General: Denies weight loss, weight gain, fatigue, fevers, chills, and night sweats. Eyes: Denies blurry vision, double vision, eye pain, itchy eyes, and tearing. Ears: Denies hearing loss, earache, and ringing in ears. Nose: Denies sinus pain,  congestion, infections, runny nose, and nosebleeds. Mouth/throat: Denies hoarseness, sore throat, bleeding gums, and difficulty swallowing. Heart: Denies chest pain, palpitations, racing heart, irregular heartbeat, leg pain or swelling, and decreased activity tolerance. Respiratory: Denies breathing difficulty, shortness of breath, wheezing, cough, and sputum. GI: Denies change in appetite, constipation, diarrhea, and blood in stool. GU: Denies difficulty urinating, pain with urinating, urgency, frequency, blood in urine. Musculoskeletal: Denies joint stiffness, pain, swelling, muscle weakness. Skin: Denies rash, itching, mass, tumors, sores, and boils Neurologic: Denies headache, fainting, dizziness, seizures, numbness, and tingling. Psychiatric: Denies depression, anxiety, difficulty sleeping, and memory loss. Endocrine: Denies heat or cold intolerance, and increased thirst or urination. Blood/lymph: Denies easy bruising, easy bruising, and swollen glands     Objective:     BP 119/80   Pulse 69   Temp 97.7 F (36.5 C) (Oral)   Resp 15   Ht 5\' 7"  (1.702 m)   Wt 57.2 kg   SpO2 100%   BMI 19.73 kg/m   Constitutional :  alert, cooperative, appears stated age and no distress  Lymphatics/Throat:  no asymmetry, masses, or scars  Respiratory:  clear to auscultation bilaterally  Cardiovascular:  regular rate and rhythm  Gastrointestinal: Soft, no guarding, focal tenderness to palpation along the midline staples which are clean dry and intact.  JP with serous drainage noted.  Clogged jejunostomy tube that is open to air but is not leaking anything..  Musculoskeletal: Steady gait and movement  Skin: Cool and moist  Psychiatric: Normal affect, non-agitated, not confused       LABS:  CMP Latest Ref Rng & Units 03/09/2019 03/06/2019 03/04/2019  Glucose 70 - 99 mg/dL 119(H) 115(H) 99  BUN 8 - 23 mg/dL 63(H) 10 10  Creatinine 0.61 - 1.24 mg/dL 6.34(H) 0.83 0.93  Sodium 135 - 145 mmol/L  131(L) 132(L) 135  Potassium 3.5 - 5.1 mmol/L 3.4(L) 3.7 4.1  Chloride 98 - 111 mmol/L 75(L) 105 106  CO2 22 - 32 mmol/L 30 19(L) 18(L)  Calcium 8.9 - 10.3 mg/dL 10.4(H) 8.5(L) 8.7(L)  Total Protein 6.5 - 8.1 g/dL 10.9(H) - -  Total Bilirubin 0.3 - 1.2 mg/dL 0.7 - -  Alkaline Phos 38 - 126 U/L 101 - -  AST 15 - 41 U/L 19 - -  ALT 0 - 44 U/L 12 - -   CBC Latest Ref Rng & Units 03/09/2019 03/06/2019 03/04/2019  WBC 4.0 - 10.5 K/uL 24.4(H) 13.1(H) 12.1(H)  Hemoglobin 13.0 - 17.0 g/dL 15.3 11.3(L) 12.0(L)  Hematocrit 39.0 - 52.0 % 44.0 31.9(L) 33.4(L)  Platelets 150 - 400 K/uL 684(H) 383 329    RADS: CLINICAL DATA:  68 year old male with recent gastric surgery (Billroth 1) presenting with vomiting.  EXAM: CT ABDOMEN AND PELVIS WITHOUT CONTRAST  TECHNIQUE: Multidetector CT imaging of the abdomen and pelvis was performed following the standard protocol without IV contrast.  COMPARISON:  Abdominal radiograph dated 03/05/2019 and CT dated 02/24/2019  FINDINGS: Evaluation of this exam is limited in the absence of intravenous contrast.  Lower chest: Emphysematous changes of the visualized lung bases.  No intra-abdominal free air or free fluid.  Hepatobiliary: The liver is unremarkable. No intrahepatic biliary ductal dilatation. There is layering sludge within the gallbladder. No pericholecystic fluid or evidence of acute cholecystitis.  Pancreas: Unremarkable. No pancreatic ductal dilatation or surrounding inflammatory changes.  Spleen: Normal in size without focal abnormality.  Adrenals/Urinary Tract: The adrenal glands are unremarkable. There is no hydronephrosis or nephrolithiasis on either side. A 12 mm hypodense lesion in the interpolar aspect of the right kidney is not well characterized but may represent a cyst. The visualized ureters appear unremarkable. The urinary bladder is grossly unremarkable.  Stomach/Bowel: Postsurgical changes of partial  gastrectomy and gastroduodenostomy. No evidence of obstruction at the gastrojejunostomy. Mildly dilated proximal small bowel loops without evidence of obstruction most likely represent an ileus. A percutaneous jejunostomy tube is noted. There is scattered sigmoid diverticula without active inflammatory changes. The appendix is normal.  Vascular/Lymphatic: Moderate atherosclerotic calcification of the aorta. The IVC is unremarkable. No portal venous gas. There is no adenopathy. A percutaneous drainage tube is noted with tip in the left anterior abdomen anterior to the stomach. No drainable fluid collection.  Reproductive: The prostate and seminal vesicles are grossly unremarkable. No pelvic mass.  Other: Midline vertical anterior abdominal wall surgical incision and cutaneous clips. No fluid collection.  Musculoskeletal: No acute or significant osseous findings.  IMPRESSION: 1. Postsurgical changes of partial gastrectomy and gastroduodenostomy. No evidence of obstruction at the gastrojejunostomy. 2. Mildly dilated proximal small bowel likely ileus. No evidence of obstruction. 3. Colonic diverticulosis. Normal appendix. 4. Aortic Atherosclerosis (ICD10-I70.0).   Electronically Signed   By: Anner Crete M.D.   On: 03/09/2019 18:34   Assessment:      Nausea and vomiting, likely dumping syndrome s/p gastric resection Acute kidney injury Status post Billroth I for pyloric stricture and perforation  Plan:      Patient states nausea vomiting started first before any abdominal pain. His last bowel movement was today, and CT does have dilated loops of small bowel but not diffuse like we typically see an ileus. Contrast is going through the new anastomosis  staple line as well so I doubt there is an obstructive type picture. Abdominal exam was benign as well. I do not think this is necessarily a postoperative ileus or some sort of surgical complication at this point.  His acute kidney injury is the main issue at this point, and maybe that is potentially causing the nausea vomiting? Along with component of dumping syndrome  Recommend n.p.o. for now to see if nausea vomiting resolves as we treat his acute kidney injury.  At this point I will be hesitant to place a NG tube blindly at bedside.  If the vomiting persists, will need to consult for IR guided placement to see if that would be a safer option.  There still may be a chance anastomosis line may be injured by placing a NG tube blindly into the stomach.  Surgery will continue to follow

## 2019-03-10 ENCOUNTER — Encounter: Payer: Self-pay | Admitting: Internal Medicine

## 2019-03-10 DIAGNOSIS — K9189 Other postprocedural complications and disorders of digestive system: Secondary | ICD-10-CM

## 2019-03-10 DIAGNOSIS — K858 Other acute pancreatitis without necrosis or infection: Secondary | ICD-10-CM

## 2019-03-10 DIAGNOSIS — K567 Ileus, unspecified: Principal | ICD-10-CM

## 2019-03-10 DIAGNOSIS — N179 Acute kidney failure, unspecified: Secondary | ICD-10-CM

## 2019-03-10 DIAGNOSIS — R112 Nausea with vomiting, unspecified: Secondary | ICD-10-CM

## 2019-03-10 LAB — COMPREHENSIVE METABOLIC PANEL
ALT: 10 U/L (ref 0–44)
AST: 13 U/L — ABNORMAL LOW (ref 15–41)
Albumin: 3.3 g/dL — ABNORMAL LOW (ref 3.5–5.0)
Alkaline Phosphatase: 66 U/L (ref 38–126)
Anion gap: 14 (ref 5–15)
BUN: 61 mg/dL — ABNORMAL HIGH (ref 8–23)
CO2: 24 mmol/L (ref 22–32)
Calcium: 7.9 mg/dL — ABNORMAL LOW (ref 8.9–10.3)
Chloride: 93 mmol/L — ABNORMAL LOW (ref 98–111)
Creatinine, Ser: 3.92 mg/dL — ABNORMAL HIGH (ref 0.61–1.24)
GFR calc Af Amer: 17 mL/min — ABNORMAL LOW (ref >60)
GFR calc non Af Amer: 15 mL/min — ABNORMAL LOW (ref >60)
Glucose, Bld: 85 mg/dL (ref 70–99)
Potassium: 3.3 mmol/L — ABNORMAL LOW (ref 3.5–5.1)
Sodium: 131 mmol/L — ABNORMAL LOW (ref 135–145)
Total Bilirubin: 0.6 mg/dL (ref 0.3–1.2)
Total Protein: 6.8 g/dL (ref 6.5–8.1)

## 2019-03-10 LAB — CBC WITH DIFFERENTIAL/PLATELET
Abs Immature Granulocytes: 0.26 10*3/uL — ABNORMAL HIGH (ref 0.00–0.07)
Basophils Absolute: 0.1 10*3/uL (ref 0.0–0.1)
Basophils Relative: 0 %
Eosinophils Absolute: 0.1 10*3/uL (ref 0.0–0.5)
Eosinophils Relative: 1 %
HCT: 32.2 % — ABNORMAL LOW (ref 39.0–52.0)
Hemoglobin: 11.3 g/dL — ABNORMAL LOW (ref 13.0–17.0)
Immature Granulocytes: 1 %
Lymphocytes Relative: 13 %
Lymphs Abs: 2.3 10*3/uL (ref 0.7–4.0)
MCH: 28.8 pg (ref 26.0–34.0)
MCHC: 35.1 g/dL (ref 30.0–36.0)
MCV: 82.1 fL (ref 80.0–100.0)
Monocytes Absolute: 1.4 10*3/uL — ABNORMAL HIGH (ref 0.1–1.0)
Monocytes Relative: 8 %
Neutro Abs: 14 10*3/uL — ABNORMAL HIGH (ref 1.7–7.7)
Neutrophils Relative %: 77 %
Platelets: 495 10*3/uL — ABNORMAL HIGH (ref 150–400)
RBC: 3.92 MIL/uL — ABNORMAL LOW (ref 4.22–5.81)
RDW: 14.8 % (ref 11.5–15.5)
WBC: 18.2 10*3/uL — ABNORMAL HIGH (ref 4.0–10.5)
nRBC: 0 % (ref 0.0–0.2)

## 2019-03-10 LAB — GLUCOSE, CAPILLARY
Glucose-Capillary: 79 mg/dL (ref 70–99)
Glucose-Capillary: 78 mg/dL (ref 70–99)
Glucose-Capillary: 80 mg/dL (ref 70–99)
Glucose-Capillary: 66 mg/dL — ABNORMAL LOW (ref 70–99)

## 2019-03-10 LAB — LIPASE, BLOOD: Lipase: 152 U/L — ABNORMAL HIGH (ref 11–51)

## 2019-03-10 LAB — SARS CORONAVIRUS 2 (TAT 6-24 HRS): SARS Coronavirus 2: NEGATIVE

## 2019-03-10 LAB — C-REACTIVE PROTEIN: CRP: 2.2 mg/dL — ABNORMAL HIGH (ref <1.0)

## 2019-03-10 LAB — PROCALCITONIN: Procalcitonin: 0.38 ng/mL

## 2019-03-10 MED ORDER — KETOROLAC TROMETHAMINE 15 MG/ML IJ SOLN
15.0000 mg | Freq: Once | INTRAMUSCULAR | Status: AC
  Administered 2019-03-10: 15 mg via INTRAVENOUS

## 2019-03-10 MED ORDER — POTASSIUM CHLORIDE 10 MEQ/100ML IV SOLN
10.0000 meq | INTRAVENOUS | Status: AC
  Administered 2019-03-10 (×3): 10 meq via INTRAVENOUS
  Filled 2019-03-10 (×4): qty 100

## 2019-03-10 MED ORDER — KETOROLAC TROMETHAMINE 15 MG/ML IJ SOLN
INTRAMUSCULAR | Status: AC
  Filled 2019-03-10: qty 1

## 2019-03-10 MED ORDER — CHLORHEXIDINE GLUCONATE CLOTH 2 % EX PADS
6.0000 | MEDICATED_PAD | Freq: Every day | CUTANEOUS | Status: DC
  Administered 2019-03-10 – 2019-03-12 (×3): 6 via TOPICAL

## 2019-03-10 NOTE — Consult Note (Signed)
Consult note       [facility] CONSULT NOTE    Date: 03/10/2019                  Patient Name:  Keith [name]  MRN: [id number]  DOB: 1950-10-02  Age / Sex: 68 y.o., male         PCP: Patient, No Pcp Per                 Service Requesting Consult:  Hospitalist                 Reason for Consult:  Acute kidney injury            History of Present Illness: Patient is a 68 y.o. male with a PMHx of cocaine abuse, duodenal ulcer, gastric ulcer, hypertension, paroxysmal atrial fibrillation, tobacco abuse, who was admitted to [facility] on 03/09/2019 for evaluation of nausea, vomiting, dehydration and acute kidney injury status post recent Billroth type I procedure.  At his last admission the patient had Billroth type I operation for peptic ulcer disease causing stricture and perforation.  Patient however left AGAINST MEDICAL ADVICE before fully recovering from the last procedure.  He states that on Thanksgiving day he had a significant amount of food and also ate significantly on Friday as well.  Friday evening he began experiencing nausea and vomiting and by Saturday he had had multiple vomiting events.  We are now asked to see him for evaluation management of acute kidney injury.  On 11/25-/20 the patient had normal renal function with a creatinine of 0.8 and EGFR greater than 60.  When he presented back on 03/09/2019 creatinine was up to 6.34.  Calcium was high at 10.4 as well.  With IV fluid hydration he has had rather rapid renal recovery and creatinine is now down to 3.92.  Serum calcium also down to 7.9.  CT scan abdomen pelvis was negative for hydronephrosis.   Medications: Outpatient medications: No medications prior to admission.    Current medications: Current Facility-Administered Medications  Medication Dose Route Frequency Provider Last Rate Last Dose  . 0.9 %  sodium chloride infusion   Intravenous Continuous Myles Rosenthal A, MD 150 mL/hr at 03/10/19 1155     . acetaminophen (TYLENOL) tablet 650 mg  650 mg Oral Q6H PRN Clance Boll, MD       Or  . acetaminophen (TYLENOL) suppository 650 mg  650 mg Rectal Q6H PRN Myles Rosenthal A, MD      . albuterol (PROVENTIL) (2.5 MG/3ML) 0.083% nebulizer solution 2.5 mg  2.5 mg Nebulization Q6H PRN Clance Boll, MD      . Chlorhexidine Gluconate Cloth 2 % PADS 6 each  6 each Topical Daily Jennye Boroughs, MD      . heparin injection 5,000 Units  5,000 Units Subcutaneous Q8H Clance Boll, MD   5,000 Units at 03/10/19 0450  . insulin aspart (novoLOG) injection 0-9 Units  0-9 Units Subcutaneous TID WC Myles Rosenthal A, MD      . iohexol (OMNIPAQUE) 9 MG/ML oral solution 500 mL  500 mL Oral BID PRN Harvest Dark, MD   500 mL at 03/09/19 1626  . nicotine (NICODERM CQ - dosed in mg/24 hours) patch 21 mg  21 mg Transdermal Daily Clance Boll, MD   21 mg at 03/10/19 0917  . ondansetron (ZOFRAN) tablet 4 mg  4 mg Oral Q6H PRN Clance Boll, MD  Or  . ondansetron (ZOFRAN) injection 4 mg  4 mg Intravenous Q6H PRN Clance Boll, MD   4 mg at 03/10/19 0449      Allergies: No Known Allergies    Past Medical History: Past Medical History:  Diagnosis Date  . CKD (chronic kidney disease), stage II   . Cocaine abuse ([place])   . Duodenal ulcer   . Gastric ulcer   . Hypertension   . Noncompliance   . Paroxysmal atrial fibrillation ([facility])   . Tobacco abuse      Past Surgical History: Past Surgical History:  Procedure Laterality Date  . ESOPHAGOGASTRODUODENOSCOPY (EGD) WITH PROPOFOL N/A 02/26/2019   Procedure: ESOPHAGOGASTRODUODENOSCOPY (EGD) WITH PROPOFOL;  Surgeon: Lucilla Lame, MD;  Location: [facility] ENDOSCOPY;  Service: Endoscopy;  Laterality: N/A;  . GASTROJEJUNOSTOMY N/A 02/27/2019   Procedure: GASTROJEJUNOSTOMY;  Surgeon: Jules Husbands, MD;  Location: [facility] ORS;  Service: General;  Laterality: N/A;     Family History: Family History  Problem Relation  Age of Onset  . Diabetes Mother   . Hypertension Mother   . CAD Father   . Diabetes Sister   . Hypertension Sister   . Kidney failure Sister      Social History: Social History   Socioeconomic History  . Marital status: Single    Spouse name: Not on file  . Number of children: Not on file  . Years of education: Not on file  . Highest education level: Not on file  Occupational History  . Not on file  Social Needs  . Financial resource strain: Not on file  . Food insecurity    Worry: Not on file    Inability: Not on file  . Transportation needs    Medical: Not on file    Non-medical: Not on file  Tobacco Use  . Smoking status: Current Every Day Smoker    Types: Cigarettes  . Smokeless tobacco: Never Used  Substance and Sexual Activity  . Alcohol use: Not Currently  . Drug use: Not on file  . Sexual activity: Not on file  Lifestyle  . Physical activity    Days per week: Not on file    Minutes per session: Not on file  . Stress: Not on file  Relationships  . Social Herbalist on phone: Not on file    Gets together: Not on file    Attends religious service: Not on file    Active member of club or organization: Not on file    Attends meetings of clubs or organizations: Not on file    Relationship status: Not on file  . Intimate partner violence    Fear of current or ex partner: Not on file    Emotionally abused: Not on file    Physically abused: Not on file    Forced sexual activity: Not on file  Other Topics Concern  . Not on file  Social History Narrative  . Not on file     Review of Systems: Review of Systems  Constitutional: Positive for malaise/fatigue. Negative for chills and fever.  HENT: Negative for congestion, hearing loss and tinnitus.   Eyes: Negative for blurred vision and double vision.  Respiratory: Negative for cough, hemoptysis and sputum production.   Cardiovascular: Negative for chest pain, palpitations and orthopnea.   Gastrointestinal: Positive for nausea and vomiting. Negative for heartburn.  Genitourinary: Negative for dysuria and urgency.  Musculoskeletal: Negative for joint pain and myalgias.  Skin: Negative for itching  and rash.  Neurological: Positive for weakness. Negative for dizziness.  Endo/Heme/Allergies: Negative for polydipsia. Does not bruise/bleed easily.  Psychiatric/Behavioral: Negative for depression. The patient is not nervous/anxious.      Vital Signs: Blood pressure 126/83, pulse (!) 55, temperature 97.7 F (36.5 C), temperature source Oral, resp. rate 19, height _0  (1.702 m), weight 53.8 kg, SpO2 97 %.  Weight trends: Filed Weights   03/09/19 1459 03/09/19 2150  Weight: 57.2 kg 53.8 kg    Physical Exam: General: NAD, resting in bed  Head: Normocephalic, atraumatic.  Eyes: Anicteric, EOMI  Nose: Mucous membranes moist, not inflammed, nonerythematous.  Throat: Oropharynx nonerythematous, no exudate appreciated.   Neck: Supple, trachea midline.  Lungs:  Normal respiratory effort. Clear to auscultation BL without crackles or wheezes.  Heart: RRR. S1 and S2 normal without gallop, murmur, or rubs.  Abdomen:  Soft, NT, feeding tube in place, additional drain in place as well  Extremities: No pretibial edema.  Neurologic: A&O X3, Motor strength is 5/5 in the all 4 extremities  Skin: No visible rashes, scars.    Lab results: Basic Metabolic Panel: Recent Labs  Lab 03/04/19 0628 03/06/19 0446 03/09/19 1547 03/09/19 2024 03/10/19 0514  NA 135 132* 131*  --  131*  K 4.1 3.7 3.4*  --  3.3*  CL 106 105 75*  --  93*  CO2 18* 19* 30  --  24  GLUCOSE 99 115* 119*  --  85  BUN 10 10 63*  --  61*  CREATININE 0.93 0.83 6.34* 5.73* 3.92*  CALCIUM 8.7* 8.5* 10.4*  --  7.9*  MG 1.9 1.8  --   --   --     Liver Function Tests: Recent Labs  Lab 03/09/19 1547 03/10/19 0514  AST 19 13*  ALT 12 10  ALKPHOS 101 66  BILITOT 0.7 0.6  PROT 10.9* 6.8  ALBUMIN 5.3* 3.3*    Recent Labs  Lab 03/09/19 1547 03/10/19 0514  LIPASE 386* 152*   No results for input(s): AMMONIA in the last 168 hours.  CBC: Recent Labs  Lab 03/04/19 0628 03/06/19 0446 03/09/19 1547 03/09/19 2024  WBC 12.1* 13.1* 24.4* 25.5*  NEUTROABS 9.8*  --   --   --   HGB 12.0* 11.3* 15.3 12.5*  HCT 33.4* 31.9* 44.0 35.8*  MCV 81.5 81.8 81.8 82.5  PLT 329 383 684* 545*    Cardiac Enzymes: No results for input(s): CKTOTAL, CKMB, CKMBINDEX, TROPONINI in the last 168 hours.  BNP: Invalid input(s): POCBNP  CBG: Recent Labs  Lab 03/10/19 0735 03/10/19 1140  GLUCAP 79 78    Microbiology: Results for orders placed or performed during the hospital encounter of 03/09/19  SARS CORONAVIRUS 2 (TAT 6-24 HRS) Nasopharyngeal Nasopharyngeal Swab     Status: None   Collection Time: 03/09/19  7:13 PM   Specimen: Nasopharyngeal Swab  Result Value Ref Range Status   SARS Coronavirus 2 NEGATIVE NEGATIVE Final    Comment: (NOTE) SARS-CoV-2 target nucleic acids are NOT DETECTED. The SARS-CoV-2 RNA is generally detectable in upper and lower respiratory specimens during the acute phase of infection. Negative results do not preclude SARS-CoV-2 infection, do not rule out co-infections with other pathogens, and should not be used as the sole basis for treatment or other patient management decisions. Negative results must be combined with clinical observations, patient history, and epidemiological information. The expected result is Negative. Fact Sheet for Patients: SugarRoll.be Fact Sheet for Healthcare Providers: https://www.woods-mathews.com/ This test is  not yet approved or cleared by the [place] and  has been authorized for detection and/or diagnosis of SARS-CoV-2 by FDA under an Emergency Use Authorization (EUA). This EUA will remain  in effect (meaning this test can be used) for the duration of the COVID-19 declaration under  Section 56 4(b)(1) of the Act, 21 U.S.C. section 360bbb-3(b)(1), unless the authorization is terminated or revoked sooner. Performed at [facility], [place] [address], [place], [place] [address]     Coagulation Studies: No results for input(s): LABPROT, INR in the last 72 hours.  Urinalysis: Recent Labs    03/09/19 2250  COLORURINE YELLOW*  LABSPEC 1.016  PHURINE 5.0  GLUCOSEU NEGATIVE  HGBUR SMALL*  BILIRUBINUR NEGATIVE  KETONESUR NEGATIVE  PROTEINUR 30*  NITRITE NEGATIVE  LEUKOCYTESUR NEGATIVE      Imaging: Ct Abdomen Pelvis Wo Contrast  Result Date: 03/09/2019 CLINICAL DATA:  68 year old male with recent gastric surgery (Billroth 1) presenting with vomiting. EXAM: CT ABDOMEN AND PELVIS WITHOUT CONTRAST TECHNIQUE: Multidetector CT imaging of the abdomen and pelvis was performed following the standard protocol without IV contrast. COMPARISON:  Abdominal radiograph dated 03/05/2019 and CT dated 02/24/2019 FINDINGS: Evaluation of this exam is limited in the absence of intravenous contrast. Lower chest: Emphysematous changes of the visualized lung bases. No intra-abdominal free air or free fluid. Hepatobiliary: The liver is unremarkable. No intrahepatic biliary ductal dilatation. There is layering sludge within the gallbladder. No pericholecystic fluid or evidence of acute cholecystitis. Pancreas: Unremarkable. No pancreatic ductal dilatation or surrounding inflammatory changes. Spleen: Normal in size without focal abnormality. Adrenals/Urinary Tract: The adrenal glands are unremarkable. There is no hydronephrosis or nephrolithiasis on either side. A 12 mm hypodense lesion in the interpolar aspect of the right kidney is not well characterized but may represent a cyst. The visualized ureters appear unremarkable. The urinary bladder is grossly unremarkable. Stomach/Bowel: Postsurgical changes of partial gastrectomy and gastroduodenostomy. No evidence of obstruction at the  gastrojejunostomy. Mildly dilated proximal small bowel loops without evidence of obstruction most likely represent an ileus. A percutaneous jejunostomy tube is noted. There is scattered sigmoid diverticula without active inflammatory changes. The appendix is normal. Vascular/Lymphatic: Moderate atherosclerotic calcification of the aorta. The IVC is unremarkable. No portal venous gas. There is no adenopathy. A percutaneous drainage tube is noted with tip in the left anterior abdomen anterior to the stomach. No drainable fluid collection. Reproductive: The prostate and seminal vesicles are grossly unremarkable. No pelvic mass. Other: Midline vertical anterior abdominal wall surgical incision and cutaneous clips. No fluid collection. Musculoskeletal: No acute or significant osseous findings. IMPRESSION: 1. Postsurgical changes of partial gastrectomy and gastroduodenostomy. No evidence of obstruction at the gastrojejunostomy. 2. Mildly dilated proximal small bowel likely ileus. No evidence of obstruction. 3. Colonic diverticulosis. Normal appendix. 4. Aortic Atherosclerosis (ICD10-I70.0). Electronically Signed   By: Anner Crete M.D.   On: 03/09/2019 18:34      Assessment & Plan: Pt is a 68 y.o. male with a PMHx of cocaine abuse, duodenal ulcer, gastric ulcer, hypertension, paroxysmal atrial fibrillation, tobacco abuse, who was admitted to [facility] on 03/09/2019 for evaluation of nausea, vomiting, dehydration and acute kidney injury status post recent Billroth type I procedure.   1.  Acute kidney injury due to prolonged nausea/vomiting and subsequent dehydration. 2.  Hyponatremia, hypovolemic. 3.  Hypokalemia due to vomiting. 4.  Status post recent Billroth type I procedure.  Plan: At the last admission patient had Billroth type I procedure.  However he left the  hospital [place] before fully recovering.  Thereafter he developed multiple episodes of nausea and vomiting.  He came back with  subsequent acute renal failure.  Creatinine greater than 6 upon admission.  Now down to 3.92.  Patient apparently given Toradol today.  Recommend avoiding any further Toradol use as it is an NSAID.  Continue 0.9 normal saline at 125 cc/h.  Administer potassium chloride 40 mEq IV x1.  Further plan as per medicine and surgery.  Thanks for consultation.

## 2019-03-10 NOTE — Progress Notes (Addendum)
Subjective:  CC: Keith [name] is a 68 y.o. male  Hospital stay day 1,   dumping syndrome, AKI  HPI: No complaints overnight.  No further emesis reported  ROS:  General: Denies weight loss, weight gain, fatigue, fevers, chills, and night sweats. Heart: Denies chest pain, palpitations, racing heart, irregular heartbeat, leg pain or swelling, and decreased activity tolerance. Respiratory: Denies breathing difficulty, shortness of breath, wheezing, cough, and sputum. GI: Denies change in appetite, heartburn, nausea, vomiting, constipation, diarrhea, and blood in stool. GU: Denies difficulty urinating, pain with urinating, urgency, frequency, blood in urine.   Objective:   Temp:  [97.6 F (36.4 C)-97.8 F (36.6 C)] 97.7 F (36.5 C) (11/29 0733) Pulse Rate:  [37-96] 55 (11/29 0733) Resp:  [15-29] 19 (11/29 0733) BP: (101-158)/(60-114) 126/83 (11/29 0733) SpO2:  [74 %-100 %] 97 % (11/29 0733) Weight:  [53.8 kg-57.2 kg] 53.8 kg (11/28 2150)     Height: 5\' 7"  (170.2 cm) Weight: 53.8 kg BMI (Calculated): 18.59   Intake/Output this shift:   Intake/Output Summary (Last 24 hours) at 03/10/2019 1432 Last data filed at 03/10/2019 1424 Gross per 24 hour  Intake 3326.37 ml  Output 465 ml  Net 2861.37 ml    Constitutional :  alert, cooperative, appears stated age and no distress  Respiratory:  clear to auscultation bilaterally  Cardiovascular:  regular rate and rhythm  Gastrointestinal: soft, non-tender; bowel sounds normal; no masses,  no organomegaly. JP with serosanguinous drainge.  No jejunostomy output  Skin: Cool and very dry, chronic per patient.  Psychiatric: Normal affect, non-agitated, not confused         LABS:  CMP Latest Ref Rng & Units 03/10/2019 03/09/2019 03/09/2019  Glucose 70 - 99 mg/dL 85 - 119(H)  BUN 8 - 23 mg/dL 61(H) - 63(H)  Creatinine 0.61 - 1.24 mg/dL 3.92(H) 5.73(H) 6.34(H)  Sodium 135 - 145 mmol/L 131(L) - 131(L)  Potassium 3.5 - 5.1 mmol/L 3.3(L) -  3.4(L)  Chloride 98 - 111 mmol/L 93(L) - 75(L)  CO2 22 - 32 mmol/L 24 - 30  Calcium 8.9 - 10.3 mg/dL 7.9(L) - 10.4(H)  Total Protein 6.5 - 8.1 g/dL 6.8 - 10.9(H)  Total Bilirubin 0.3 - 1.2 mg/dL 0.6 - 0.7  Alkaline Phos 38 - 126 U/L 66 - 101  AST 15 - 41 U/L 13(L) - 19  ALT 0 - 44 U/L 10 - 12   CBC Latest Ref Rng & Units 03/10/2019 03/09/2019 03/09/2019  WBC 4.0 - 10.5 K/uL 18.2(H) 25.5(H) 24.4(H)  Hemoglobin 13.0 - 17.0 g/dL 11.3(L) 12.5(L) 15.3  Hematocrit 39.0 - 52.0 % 32.2(L) 35.8(L) 44.0  Platelets 150 - 400 K/uL 495(H) 545(H) 684(H)    RADS: n/a Assessment:   S/p bilroth I, N/V likely from dumping syndrome. elevated lipase, wbc, and Cr likely from the N/V.  All improving.  Patient does not have typical epigastric pain or imaging to support pancreatitis.   Advance to bariatric clears and monitor.  Staple from midline removed.  Small amount of fluid noted.  Covered with 4x4.  JP removed without any issues.  Continue to monitor

## 2019-03-10 NOTE — Progress Notes (Addendum)
Progress Note    Keith [name]  [id number] DOB: 27-Jul-1950  DOA: 03/09/2019 PCP: Patient, No Pcp Per      Brief Narrative:    Medical records reviewed and are as summarized below:  Keith [name] is an 68 y.o. male medical history significant for P Afib not on anticoagulation, tobacco abuse, history of cocaine abuse in remission x 6 mo, hypertension, gastritis and gastric ulcers, status post recent antrectomy with Bilroth 1 reconstruction, vagotomy and jejunostomy feeding tube placement on 11/18. Patient left the hospital [facility] [place] on 03/06/2019. He presented to the hospital with intractable nausea and vomiting associated with abdominal pain.     Assessment/Plan:   Active Problems:   Acute renal failure (HCC)   Ileus following gastrointestinal surgery (HCC)   Nausea and vomiting   Body mass index is 18.59 kg/m.    Acute kidney injury: He has good urine output.  Continue IV fluids for hydration.  Follow-up BMP.  Hyponatremia: Continue IV fluids  Hypokalemia: Replete potassium  Nausea and vomiting/elevated lipase: N.p.o. for now.  Analgesics as needed for pain.  IV fluids for hydration.  Lipase is down from 386 to 152.  Elevated lipase may be from vomiting or AKI.  Follow-up with surgeon.  Leukocytosis: Probably reactive from recent vomiting.  Repeat CBC.  No evidence of infection thus far.  Recent partial gastrectomy with Billroth I reconstruction on 02/27/2019: Follow-up with general surgeon.  Paroxysmal atrial fibrillation: Not on anticoagulation because of history of bleeding ulcers  History of asthma and COPD: Stable.   Family Communication/Anticipated D/C date and plan/Code Status   DVT prophylaxis: Heparin Code Status: Full code Family Communication: Plan discussed with the patient Disposition Plan: Home in 2 to 3 days      Subjective:   C/o abdominal pain but he said is better than yesterday.  No vomiting today.   Objective:    Vitals:   03/09/19 2114 03/09/19 2150 03/10/19 0507 03/10/19 0733  BP:  135/80 105/60 126/83  Pulse: 68 78 (!) 56 (!) 55  Resp:  20 20 19   Temp:  97.8 F (36.6 C) 97.6 F (36.4 C) 97.7 F (36.5 C)  TempSrc:  Oral Oral Oral  SpO2:  94% 99% 97%  Weight:  53.8 kg    Height:  5\' 7"  (1.702 m)      Intake/Output Summary (Last 24 hours) at 03/10/2019 1214 Last data filed at 03/10/2019 0600 Gross per 24 hour  Intake 2175.76 ml  Output 465 ml  Net 1710.76 ml   Filed Weights   03/09/19 1459 03/09/19 2150  Weight: 57.2 kg 53.8 kg    Exam:  GEN: NAD SKIN: No rash EYES: EOMI ENT: MMM CV: RRR PULM: CTA B ABD: staples on surgical wound is intact, soft, ND, tenderness on the left side of the abdomen, no rebound tenderness or guarding, +BS, jejunostomy tube in place on the left side, JP drain in place on the right side CNS: AAO x 3, non focal EXT: No edema or tenderness   Data Reviewed:   I have personally reviewed following labs and imaging studies:  Labs: Labs show the following:   Basic Metabolic Panel: Recent Labs  Lab 03/04/19 0628 03/06/19 0446 03/09/19 1547 03/09/19 2024 03/10/19 0514  NA 135 132* 131*  --  131*  K 4.1 3.7 3.4*  --  3.3*  CL 106 105 75*  --  93*  CO2 18* 19* 30  --  24  GLUCOSE  99 115* 119*  --  85  BUN 10 10 63*  --  61*  CREATININE 0.93 0.83 6.34* 5.73* 3.92*  CALCIUM 8.7* 8.5* 10.4*  --  7.9*  MG 1.9 1.8  --   --   --    GFR Estimated Creatinine Clearance: 13.7 mL/min (A) (by C-G formula based on SCr of 3.92 mg/dL (H)). Liver Function Tests: Recent Labs  Lab 03/09/19 1547 03/10/19 0514  AST 19 13*  ALT 12 10  ALKPHOS 101 66  BILITOT 0.7 0.6  PROT 10.9* 6.8  ALBUMIN 5.3* 3.3*   Recent Labs  Lab 03/09/19 1547 03/10/19 0514  LIPASE 386* 152*   No results for input(s): AMMONIA in the last 168 hours. Coagulation profile No results for input(s): INR, PROTIME in the last 168 hours.  CBC: Recent Labs  Lab  03/04/19 0628 03/06/19 0446 03/09/19 1547 03/09/19 2024  WBC 12.1* 13.1* 24.4* 25.5*  NEUTROABS 9.8*  --   --   --   HGB 12.0* 11.3* 15.3 12.5*  HCT 33.4* 31.9* 44.0 35.8*  MCV 81.5 81.8 81.8 82.5  PLT 329 383 684* 545*   Cardiac Enzymes: No results for input(s): CKTOTAL, CKMB, CKMBINDEX, TROPONINI in the last 168 hours. BNP (last 3 results) No results for input(s): PROBNP in the last 8760 hours. CBG: Recent Labs  Lab 03/10/19 0735 03/10/19 1140  GLUCAP 79 78   D-Dimer: No results for input(s): DDIMER in the last 72 hours. Hgb A1c: Recent Labs    03/09/19 Aug 03, 2022  HGBA1C 6.4*   Lipid Profile: No results for input(s): CHOL, HDL, LDLCALC, TRIG, CHOLHDL, LDLDIRECT in the last 72 hours. Thyroid function studies: Recent Labs    03/09/19 2022-08-03  TSH 6.872*   Anemia work up: No results for input(s): VITAMINB12, FOLATE, FERRITIN, TIBC, IRON, RETICCTPCT in the last 72 hours. Sepsis Labs: Recent Labs  Lab 03/04/19 0628 03/06/19 0446 03/09/19 1547 03/09/19 08/03/22 03/10/19 0514  PROCALCITON  --   --   --  0.56 0.38  WBC 12.1* 13.1* 24.4* 25.5*  --   LATICACIDVEN  --   --   --  2.2*  --     Microbiology Recent Results (from the past 240 hour(s))  SARS CORONAVIRUS 2 (TAT 6-24 HRS) Nasopharyngeal Nasopharyngeal Swab     Status: None   Collection Time: 03/09/19  7:13 PM   Specimen: Nasopharyngeal Swab  Result Value Ref Range Status   SARS Coronavirus 2 NEGATIVE NEGATIVE Final    Comment: (NOTE) SARS-CoV-2 target nucleic acids are NOT DETECTED. The SARS-CoV-2 RNA is generally detectable in upper and lower respiratory specimens during the acute phase of infection. Negative results do not preclude SARS-CoV-2 infection, do not rule out co-infections with other pathogens, and should not be used as the sole basis for treatment or other patient management decisions. Negative results must be combined with clinical observations, patient history, and epidemiological information.  The expected result is Negative. Fact Sheet for Patients: SugarRoll.be Fact Sheet for Healthcare Providers: https://www.woods-mathews.com/ This test is not yet approved or cleared by the [place] FDA and  has been authorized for detection and/or diagnosis of SARS-CoV-2 by FDA under an Emergency Use Authorization (EUA). This EUA will remain  in effect (meaning this test can be used) for the duration of the COVID-19 declaration under Section 56 4(b)(1) of the Act, 21 U.S.C. section 360bbb-3(b)(1), unless the authorization is terminated or revoked sooner. Performed at [facility], [place] [address] [place], [place] [address]  Procedures and diagnostic studies:  Ct Abdomen Pelvis Wo Contrast  Result Date: 03/09/2019 CLINICAL DATA:  68 year old male with recent gastric surgery (Billroth 1) presenting with vomiting. EXAM: CT ABDOMEN AND PELVIS WITHOUT CONTRAST TECHNIQUE: Multidetector CT imaging of the abdomen and pelvis was performed following the standard protocol without IV contrast. COMPARISON:  Abdominal radiograph dated 03/05/2019 and CT dated 02/24/2019 FINDINGS: Evaluation of this exam is limited in the absence of intravenous contrast. Lower chest: Emphysematous changes of the visualized lung bases. No intra-abdominal free air or free fluid. Hepatobiliary: The liver is unremarkable. No intrahepatic biliary ductal dilatation. There is layering sludge within the gallbladder. No pericholecystic fluid or evidence of acute cholecystitis. Pancreas: Unremarkable. No pancreatic ductal dilatation or surrounding inflammatory changes. Spleen: Normal in size without focal abnormality. Adrenals/Urinary Tract: The adrenal glands are unremarkable. There is no hydronephrosis or nephrolithiasis on either side. A 12 mm hypodense lesion in the interpolar aspect of the right kidney is not well characterized but may represent a cyst. The visualized ureters  appear unremarkable. The urinary bladder is grossly unremarkable. Stomach/Bowel: Postsurgical changes of partial gastrectomy and gastroduodenostomy. No evidence of obstruction at the gastrojejunostomy. Mildly dilated proximal small bowel loops without evidence of obstruction most likely represent an ileus. A percutaneous jejunostomy tube is noted. There is scattered sigmoid diverticula without active inflammatory changes. The appendix is normal. Vascular/Lymphatic: Moderate atherosclerotic calcification of the aorta. The IVC is unremarkable. No portal venous gas. There is no adenopathy. A percutaneous drainage tube is noted with tip in the left anterior abdomen anterior to the stomach. No drainable fluid collection. Reproductive: The prostate and seminal vesicles are grossly unremarkable. No pelvic mass. Other: Midline vertical anterior abdominal wall surgical incision and cutaneous clips. No fluid collection. Musculoskeletal: No acute or significant osseous findings. IMPRESSION: 1. Postsurgical changes of partial gastrectomy and gastroduodenostomy. No evidence of obstruction at the gastrojejunostomy. 2. Mildly dilated proximal small bowel likely ileus. No evidence of obstruction. 3. Colonic diverticulosis. Normal appendix. 4. Aortic Atherosclerosis (ICD10-I70.0). Electronically Signed   By: Anner Crete M.D.   On: 03/09/2019 18:34    Medications:   . Chlorhexidine Gluconate Cloth  6 each Topical Daily  . heparin  5,000 Units Subcutaneous Q8H  . insulin aspart  0-9 Units Subcutaneous TID WC  . nicotine  21 mg Transdermal Daily   Continuous Infusions: . sodium chloride 150 mL/hr at 03/10/19 1155     LOS: 1 day   Tamyrah Burbage  [facility]   *Please refer to [email or web address], password [email or web address] to get updated schedule on who will round on this patient, as hospitalists switch teams weekly. If 7PM-7AM, please contact night-coverage at [email or web address], password [email or web address] for any overnight needs.   03/10/2019, 12:14 PM

## 2019-03-11 LAB — BASIC METABOLIC PANEL
Anion gap: 11 (ref 5–15)
BUN: 36 mg/dL — ABNORMAL HIGH (ref 8–23)
CO2: 24 mmol/L (ref 22–32)
Calcium: 7.9 mg/dL — ABNORMAL LOW (ref 8.9–10.3)
Chloride: 96 mmol/L — ABNORMAL LOW (ref 98–111)
Creatinine, Ser: 1.53 mg/dL — ABNORMAL HIGH (ref 0.61–1.24)
GFR calc Af Amer: 53 mL/min — ABNORMAL LOW (ref >60)
GFR calc non Af Amer: 46 mL/min — ABNORMAL LOW (ref >60)
Glucose, Bld: 105 mg/dL — ABNORMAL HIGH (ref 70–99)
Potassium: 3 mmol/L — ABNORMAL LOW (ref 3.5–5.1)
Sodium: 131 mmol/L — ABNORMAL LOW (ref 135–145)

## 2019-03-11 LAB — CBC WITH DIFFERENTIAL/PLATELET
Abs Immature Granulocytes: 0.16 10*3/uL — ABNORMAL HIGH (ref 0.00–0.07)
Basophils Absolute: 0.1 10*3/uL (ref 0.0–0.1)
Basophils Relative: 1 %
Eosinophils Absolute: 0.2 10*3/uL (ref 0.0–0.5)
Eosinophils Relative: 2 %
HCT: 29.6 % — ABNORMAL LOW (ref 39.0–52.0)
Hemoglobin: 10.4 g/dL — ABNORMAL LOW (ref 13.0–17.0)
Immature Granulocytes: 2 %
Lymphocytes Relative: 24 %
Lymphs Abs: 2.1 10*3/uL (ref 0.7–4.0)
MCH: 29.1 pg (ref 26.0–34.0)
MCHC: 35.1 g/dL (ref 30.0–36.0)
MCV: 82.7 fL (ref 80.0–100.0)
Monocytes Absolute: 0.7 10*3/uL (ref 0.1–1.0)
Monocytes Relative: 8 %
Neutro Abs: 5.8 10*3/uL (ref 1.7–7.7)
Neutrophils Relative %: 63 %
Platelets: 465 10*3/uL — ABNORMAL HIGH (ref 150–400)
RBC: 3.58 MIL/uL — ABNORMAL LOW (ref 4.22–5.81)
RDW: 13.9 % (ref 11.5–15.5)
WBC: 9 10*3/uL (ref 4.0–10.5)
nRBC: 0 % (ref 0.0–0.2)

## 2019-03-11 LAB — GLUCOSE, CAPILLARY
Glucose-Capillary: 98 mg/dL (ref 70–99)
Glucose-Capillary: 80 mg/dL (ref 70–99)
Glucose-Capillary: 80 mg/dL (ref 70–99)
Glucose-Capillary: 83 mg/dL (ref 70–99)

## 2019-03-11 LAB — PROCALCITONIN: Procalcitonin: 0.14 ng/mL

## 2019-03-11 LAB — MAGNESIUM: Magnesium: 2.1 mg/dL (ref 1.7–2.4)

## 2019-03-11 MED ORDER — SODIUM CHLORIDE 0.9% FLUSH
3.0000 mL | Freq: Two times a day (BID) | INTRAVENOUS | Status: DC
  Administered 2019-03-11: 3 mL via INTRAVENOUS

## 2019-03-11 MED ORDER — POTASSIUM CHLORIDE IN NACL 40-0.9 MEQ/L-% IV SOLN
INTRAVENOUS | Status: DC
  Administered 2019-03-11 (×2): 75 mL/h via INTRAVENOUS
  Filled 2019-03-11 (×3): qty 1000

## 2019-03-11 MED ORDER — ENSURE ENLIVE PO LIQD
237.0000 mL | Freq: Three times a day (TID) | ORAL | Status: DC
  Administered 2019-03-11 (×2): 237 mL via ORAL

## 2019-03-11 NOTE — Progress Notes (Signed)
[place] Hospital Day(s): 2.   Post op day(s): 12 Days  Interval History:  Patient seen and examined no acute events or new complaints overnight.  Patient reports he continues to feel better this morning, no issues with nausea or emesis. Abdominal pain improving.  Leukocytosis resolved Renal function improving (sCr 1.53), good U/O (suspect error in I/O documentation) Hypokalemia to 3.0 On bariatric CLD, tolerated well. + flatus    Vital signs in last 24 hours: [min-max] current  Temp:  [97.4 F (36.3 C)-98 F (36.7 C)] 97.4 F (36.3 C) (11/30 0411) Pulse Rate:  [49-55] 49 (11/30 0411) Resp:  [18-19] 18 (11/30 0411) BP: (117-170)/(78-92) 149/88 (11/30 0411) SpO2:  [98 %-100 %] 100 % (11/30 0411) Weight:  [56.5 kg] 56.5 kg (11/30 0515)     Height: 5\' 7"  (170.2 cm) Weight: 56.5 kg BMI (Calculated): 19.5   Intake/Output last 2 shifts:  11/29 0701 - 11/30 0700 In: 1150.6 [I.V.:1149.8; IV Piggyback:0.8] Out: [id number] [Urine:37015]   Physical Exam:  Constitutional: alert, cooperative and no distress  Respiratory: breathing non-labored at rest  Cardiovascular: regular rate and sinus rhythm  Gastrointestinal: soft, incisional soreness, and non-distended. Jejunostomy tube in left abdomen, no drainage Integumentary: Midline laparotomy incision healing well, staples removed, drainage to the inferior portion, serosanguinous, no purulence.   Labs:  CBC Latest Ref Rng & Units 03/11/2019 03/10/2019 03/09/2019  WBC 4.0 - 10.5 K/uL 9.0 18.2(H) 25.5(H)  Hemoglobin 13.0 - 17.0 g/dL 10.4(L) 11.3(L) 12.5(L)  Hematocrit 39.0 - 52.0 % 29.6(L) 32.2(L) 35.8(L)  Platelets 150 - 400 K/uL 465(H) 495(H) 545(H)   CMP Latest Ref Rng & Units 03/10/2019 03/09/2019 03/09/2019  Glucose 70 - 99 mg/dL 85 - 119(H)  BUN 8 - 23 mg/dL 61(H) - 63(H)  Creatinine 0.61 - 1.24 mg/dL 3.92(H) 5.73(H) 6.34(H)  Sodium 135 - 145 mmol/L 131(L) - 131(L)  Potassium 3.5 - 5.1  mmol/L 3.3(L) - 3.4(L)  Chloride 98 - 111 mmol/L 93(L) - 75(L)  CO2 22 - 32 mmol/L 24 - 30  Calcium 8.9 - 10.3 mg/dL 7.9(L) - 10.4(H)  Total Protein 6.5 - 8.1 g/dL 6.8 - 10.9(H)  Total Bilirubin 0.3 - 1.2 mg/dL 0.6 - 0.7  Alkaline Phos 38 - 126 U/L 66 - 101  AST 15 - 41 U/L 13(L) - 19  ALT 0 - 44 U/L 10 - 12     Imaging studies: No new pertinent imaging studies   Assessment/Plan: 68 y.o. male with improved renal function and resolved NV after being readmitted for AKI and possible dumping syndrome 12 days s/p antrectomy with Bilroth 1 reconstruction, vagotomy, and feeding jejunostomy tube placementfor GOOand PUD   - Okay to advance to full liquid diet today; go slow   - Replete K+  - Continue to monitor renal function/UO, appreciate nephrology input   - pain control prn; antiemetics prn  - monitor abdominal examiantion   - continue dressing changes to midline wound daily + prn  - continue to mobilize  - Further management per primary service; we will follow   All of the above findings and recommendations were discussed with the patient, and the medical team, and all of patient's questions were answered to his expressed satisfaction.  -- Edison Simon, PA-C [facility] 03/11/2019, 7:36 AM [phone] M-F: 7am - 4pm

## 2019-03-11 NOTE — Progress Notes (Signed)
[facility]  ROUNDING NOTE   Subjective:   Creatinine 1.53 (3.92)  NS with 40KCl at 16mL/hr  Objective:  Vital signs in last 24 hours:  Temp:  [97.4 F (36.3 C)-98 F (36.7 C)] 97.8 F (36.6 C) (11/30 0757) Pulse Rate:  [45-55] 45 (11/30 0757) Resp:  [18-19] 19 (11/30 0757) BP: (117-170)/(78-92) 123/80 (11/30 0757) SpO2:  [98 %-100 %] 100 % (11/30 0757) Weight:  [56.5 kg] 56.5 kg (11/30 0515)  Weight change: -0.653 kg Filed Weights   03/09/19 1459 03/09/19 2150 03/11/19 0515  Weight: 57.2 kg 53.8 kg 56.5 kg    Intake/Output: I/O last 3 completed shifts: In: 2326.4 [I.V.:2325.5; IV Piggyback:0.8] Out: [phone] MA:[id number]; Drains:15]   Intake/Output this shift:  Total I/O In: 300 [P.O.:300] Out: 700 [Urine:700]  Physical Exam: General: NAD, laying in bed  Head: Normocephalic, atraumatic. Moist oral mucosal membranes  Eyes: Anicteric, PERRL  Neck: Supple, trachea midline  Lungs:  Clear to auscultation  Heart: Regular rate and rhythm  Abdomen:  Soft, nontender,   Extremities: no peripheral edema.  Neurologic: Nonfocal, moving all four extremities  Skin: No lesions        Basic Metabolic Panel: Recent Labs  Lab 03/06/19 0446 03/09/19 1547 03/09/19 2024 03/10/19 0514 03/11/19 0618  NA 132* 131*  --  131* 131*  K 3.7 3.4*  --  3.3* 3.0*  CL 105 75*  --  93* 96*  CO2 19* 30  --  24 24  GLUCOSE 115* 119*  --  85 105*  BUN 10 63*  --  61* 36*  CREATININE 0.83 6.34* 5.73* 3.92* 1.53*  CALCIUM 8.5* 10.4*  --  7.9* 7.9*  MG 1.8  --   --   --  2.1    Liver Function Tests: Recent Labs  Lab 03/09/19 1547 03/10/19 0514  AST 19 13*  ALT 12 10  ALKPHOS 101 66  BILITOT 0.7 0.6  PROT 10.9* 6.8  ALBUMIN 5.3* 3.3*   Recent Labs  Lab 03/09/19 1547 03/10/19 0514  LIPASE 386* 152*   No results for input(s): AMMONIA in the last 168 hours.  CBC: Recent Labs  Lab 03/06/19 0446 03/09/19 1547 03/09/19 2024 03/10/19 0515 03/11/19 0618   WBC 13.1* 24.4* 25.5* 18.2* 9.0  NEUTROABS  --   --   --  14.0* 5.8  HGB 11.3* 15.3 12.5* 11.3* 10.4*  HCT 31.9* 44.0 35.8* 32.2* 29.6*  MCV 81.8 81.8 82.5 82.1 82.7  PLT 383 684* 545* 495* 465*    Cardiac Enzymes: No results for input(s): CKTOTAL, CKMB, CKMBINDEX, TROPONINI in the last 168 hours.  BNP: Invalid input(s): POCBNP  CBG: Recent Labs  Lab 03/10/19 1140 03/10/19 1656 03/10/19 2126 03/11/19 0758 03/11/19 1158  GLUCAP 78 80 66* 98 80    Microbiology: Results for orders placed or performed during the hospital encounter of 03/09/19  SARS CORONAVIRUS 2 (TAT 6-24 HRS) Nasopharyngeal Nasopharyngeal Swab     Status: None   Collection Time: 03/09/19  7:13 PM   Specimen: Nasopharyngeal Swab  Result Value Ref Range Status   SARS Coronavirus 2 NEGATIVE NEGATIVE Final    Comment: (NOTE) SARS-CoV-2 target nucleic acids are NOT DETECTED. The SARS-CoV-2 RNA is generally detectable in upper and lower respiratory specimens during the acute phase of infection. Negative results do not preclude SARS-CoV-2 infection, do not rule out co-infections with other pathogens, and should not be used as the sole basis for treatment or other patient management decisions. Negative results must be  combined with clinical observations, patient history, and epidemiological information. The expected result is Negative. Fact Sheet for Patients: SugarRoll.be Fact Sheet for Healthcare Providers: https://www.woods-mathews.com/ This test is not yet approved or cleared by the [place] FDA and  has been authorized for detection and/or diagnosis of SARS-CoV-2 by FDA under an Emergency Use Authorization (EUA). This EUA will remain  in effect (meaning this test can be used) for the duration of the COVID-19 declaration under Section 56 4(b)(1) of the Act, 21 U.S.C. section 360bbb-3(b)(1), unless the authorization is terminated or revoked sooner. Performed  at [facility], [place] [address] [place], [place] [address]     Coagulation Studies: No results for input(s): LABPROT, INR in the last 72 hours.  Urinalysis: Recent Labs    03/09/19 2250  COLORURINE YELLOW*  LABSPEC 1.016  PHURINE 5.0  GLUCOSEU NEGATIVE  HGBUR SMALL*  BILIRUBINUR NEGATIVE  KETONESUR NEGATIVE  PROTEINUR 30*  NITRITE NEGATIVE  LEUKOCYTESUR NEGATIVE      Imaging: Ct Abdomen Pelvis Wo Contrast  Result Date: 03/09/2019 CLINICAL DATA:  68 year old male with recent gastric surgery (Billroth 1) presenting with vomiting. EXAM: CT ABDOMEN AND PELVIS WITHOUT CONTRAST TECHNIQUE: Multidetector CT imaging of the abdomen and pelvis was performed following the standard protocol without IV contrast. COMPARISON:  Abdominal radiograph dated 03/05/2019 and CT dated 02/24/2019 FINDINGS: Evaluation of this exam is limited in the absence of intravenous contrast. Lower chest: Emphysematous changes of the visualized lung bases. No intra-abdominal free air or free fluid. Hepatobiliary: The liver is unremarkable. No intrahepatic biliary ductal dilatation. There is layering sludge within the gallbladder. No pericholecystic fluid or evidence of acute cholecystitis. Pancreas: Unremarkable. No pancreatic ductal dilatation or surrounding inflammatory changes. Spleen: Normal in size without focal abnormality. Adrenals/Urinary Tract: The adrenal glands are unremarkable. There is no hydronephrosis or nephrolithiasis on either side. A 12 mm hypodense lesion in the interpolar aspect of the right kidney is not well characterized but may represent a cyst. The visualized ureters appear unremarkable. The urinary bladder is grossly unremarkable. Stomach/Bowel: Postsurgical changes of partial gastrectomy and gastroduodenostomy. No evidence of obstruction at the gastrojejunostomy. Mildly dilated proximal small bowel loops without evidence of obstruction most likely represent an ileus. A percutaneous  jejunostomy tube is noted. There is scattered sigmoid diverticula without active inflammatory changes. The appendix is normal. Vascular/Lymphatic: Moderate atherosclerotic calcification of the aorta. The IVC is unremarkable. No portal venous gas. There is no adenopathy. A percutaneous drainage tube is noted with tip in the left anterior abdomen anterior to the stomach. No drainable fluid collection. Reproductive: The prostate and seminal vesicles are grossly unremarkable. No pelvic mass. Other: Midline vertical anterior abdominal wall surgical incision and cutaneous clips. No fluid collection. Musculoskeletal: No acute or significant osseous findings. IMPRESSION: 1. Postsurgical changes of partial gastrectomy and gastroduodenostomy. No evidence of obstruction at the gastrojejunostomy. 2. Mildly dilated proximal small bowel likely ileus. No evidence of obstruction. 3. Colonic diverticulosis. Normal appendix. 4. Aortic Atherosclerosis (ICD10-I70.0). Electronically Signed   By: Anner Crete M.D.   On: 03/09/2019 18:34     Medications:   . 0.9 % NaCl with KCl 40 mEq / L 75 mL/hr (03/11/19 0949)   . Chlorhexidine Gluconate Cloth  6 each Topical Daily  . heparin  5,000 Units Subcutaneous Q8H  . insulin aspart  0-9 Units Subcutaneous TID WC  . nicotine  21 mg Transdermal Daily   acetaminophen **OR** acetaminophen, albuterol, iohexol, ondansetron **OR** ondansetron (ZOFRAN) IV  Assessment/ Plan:  Mr. Keith  A [name] is a 68 y.o. black male with cocaine abuse, duodenal ulcer, gastric ulcer, hypertension, paroxysmal atrial fibrillation, tobacco abuse, who was admitted to [facility] on 03/09/2019 for evaluation of nausea, vomiting, dehydration and acute kidney injury status post recent Billroth type I procedure.   1.  Acute kidney injury due to prolonged nausea/vomiting and subsequent dehydration. Baseline creatinine of 0.83 on 03/06/19.  Prerenal azotemia.  2.  Hyponatremia, hypovolemic. 3.  Hypokalemia  due to GI losses. 4.  Status post recent Billroth type I procedure.  Plan: - Continue IV fluids.  - Replace potassium   LOS: 2 Keith [name] 11/30/20202:53 PM

## 2019-03-11 NOTE — Progress Notes (Signed)
CRITICAL VALUE ALERT  Critical Value:  NA 131, K 3.0  Date & Time Notied:  03/11/19 0830  Provider Notified: Dr. Laverle Patter  Orders Received/Actions taken: see orders

## 2019-03-11 NOTE — Progress Notes (Signed)
Foley catheter removed at 13:15.per MD order, Pt tolerated well. Will continue to monitor.

## 2019-03-11 NOTE — Progress Notes (Signed)
Progress Note    Keith [name]  [id number] DOB: 01/21/1951  DOA: 03/09/2019 PCP: Patient, No Pcp Per      Brief Narrative:    Medical records reviewed and are as summarized below:  Keith [name] is an 68 y.o. male medical history significant for P Afib not on anticoagulation, tobacco abuse, history of cocaine abuse in remission x 6 mo, hypertension, gastritis and gastric ulcers, status post recent antrectomy with Bilroth 1 reconstruction, vagotomy and jejunostomy feeding tube placement on 11/18. Patient left the hospital [place] on 03/06/2019. He presented to the hospital with intractable nausea and vomiting associated with abdominal pain.     Assessment/Plan:   Active Problems:   Acute renal failure (HCC)   Ileus following gastrointestinal surgery (HCC)   Nausea and vomiting   Body mass index is 19.51 kg/m.    Acute kidney injury: Improving.  Decrease IV fluids.  Follow-up BMP.  Mild hyponatremia: Sodium is still low but stable.  Continue IV fluids  Hypokalemia: Replete potassium  Nausea and vomiting/elevated lipase: Patient tolerated clear liquid and diet has been advanced to full liquid diet.  Analgesics as needed for pain.Lipase is down from 386 to 152.  Elevated lipase may be from vomiting or AKI.  Follow-up with surgeon.  Leukocytosis: Resolved  Recent partial gastrectomy with Billroth I reconstruction on 02/27/2019: Follow-up with general surgeon.  Paroxysmal atrial fibrillation: Not on anticoagulation because of history of bleeding ulcers  History of asthma and COPD: Stable.   Family Communication/Anticipated D/C date and plan/Code Status   DVT prophylaxis: Heparin Code Status: Full code Family Communication: Plan discussed with the patient Disposition Plan: Possible discharge to home tomorrow depending on clinical improvement.      Subjective:   He feels better and he is tolerating liquid diet.  No vomiting.   Abdominal pain is better.   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  Surgical wound looks clean, dry and intact.  JP drain in the right side of her abdomen has been removed.  Abdomen is soft , ND, NT. +BS, + jejunostomy tube on the left side CNS: AAO x 3, non focal EXT: No edema or tenderness    Data Reviewed:   I have personally reviewed following labs and imaging studies:  Labs: Labs show the following:   Basic Metabolic Panel: Recent Labs  Lab 03/06/19 0446 03/09/19 1547 03/09/19 2024 03/10/19 0514 03/11/19 0618  NA 132* 131*  --  131* 131*  K 3.7 3.4*  --  3.3* 3.0*  CL 105  75*  --  93* 96*  CO2 19* 30  --  24 24  GLUCOSE 115* 119*  --  85 105*  BUN 10 63*  --  61* 36*  CREATININE 0.83 6.34* 5.73* 3.92* 1.53*  CALCIUM 8.5* 10.4*  --  7.9* 7.9*  MG 1.8  --   --   --  2.1   GFR Estimated Creatinine Clearance: 36.9 mL/min (A) (by C-G formula based on SCr of 1.53 mg/dL (H)). Liver Function Tests: Recent Labs  Lab 03/09/19 1547 03/10/19 0514  AST 19 13*  ALT 12 10  ALKPHOS 101 66  BILITOT 0.7 0.6  PROT 10.9* 6.8  ALBUMIN 5.3* 3.3*   Recent Labs  Lab 03/09/19 1547 03/10/19 0514  LIPASE 386* 152*   No results for input(s): AMMONIA in the last 168  hours. Coagulation profile No results for input(s): INR, PROTIME in the last 168 hours.  CBC: Recent Labs  Lab 03/06/19 0446 03/09/19 1547 03/09/19 2024 03/10/19 0515 03/11/19 0618  WBC 13.1* 24.4* 25.5* 18.2* 9.0  NEUTROABS  --   --   --  14.0* 5.8  HGB 11.3* 15.3 12.5* 11.3* 10.4*  HCT 31.9* 44.0 35.8* 32.2* 29.6*  MCV 81.8 81.8 82.5 82.1 82.7  PLT 383 684* 545* 495* 465*   Cardiac Enzymes: No results for input(s): CKTOTAL, CKMB, CKMBINDEX, TROPONINI in the last 168 hours. BNP (last 3 results) No results for input(s): PROBNP in the last 8760 hours. CBG: Recent Labs  Lab 03/10/19 1140 03/10/19 1656 03/10/19 07-16-2124 03/11/19 0758 03/11/19 1158  GLUCAP 78 80 66* 98 80   D-Dimer: No results for input(s): DDIMER in the last 72 hours. Hgb A1c: Recent Labs    03/09/19 2022-07-17  HGBA1C 6.4*   Lipid Profile: No results for input(s): CHOL, HDL, LDLCALC, TRIG, CHOLHDL, LDLDIRECT in the last 72 hours. Thyroid function studies: Recent Labs    03/09/19 Jul 17, 2022  TSH 6.872*   Anemia work up: No results for input(s): VITAMINB12, FOLATE, FERRITIN, TIBC, IRON, RETICCTPCT in the last 72 hours. Sepsis Labs: Recent Labs  Lab 03/09/19 1547 03/09/19 07-17-2022 03/10/19 0514 03/10/19 0515 03/11/19 0618  PROCALCITON  --  0.56 0.38  --  0.14  WBC 24.4* 25.5*  --  18.2* 9.0  LATICACIDVEN  --  2.2*  --   --   --     Microbiology Recent Results (from the past 240 hour(s))  SARS CORONAVIRUS 2 (TAT 6-24 HRS) Nasopharyngeal Nasopharyngeal Swab     Status: None   Collection Time: 03/09/19  7:13 PM   Specimen: Nasopharyngeal Swab  Result Value Ref Range Status   SARS Coronavirus 2 NEGATIVE NEGATIVE Final    Comment: (NOTE) SARS-CoV-2 target nucleic acids are NOT DETECTED. The SARS-CoV-2 RNA is generally detectable in upper and lower respiratory specimens during the acute phase of infection. Negative results do not preclude SARS-CoV-2 infection, do not rule out co-infections with other  pathogens, and should not be used as the sole basis for treatment or other patient management decisions. Negative results must be combined with clinical observations, patient history, and epidemiological information. The expected result is Negative. Fact Sheet for Patients: SugarRoll.be Fact Sheet for Healthcare Providers: https://www.woods-mathews.com/ This test is not yet approved or cleared by the [place] FDA and  has been authorized for detection and/or diagnosis of SARS-CoV-2 by FDA under an Emergency Use Authorization (EUA). This EUA will remain  in effect (meaning this test can be used) for the duration of the COVID-19 declaration under Section  56 4(b)(1) of the Act, 21 U.S.C. section 360bbb-3(b)(1), unless the authorization is terminated or revoked sooner. Performed at [facility], [place] [address], [place], [place] [address]     Procedures and diagnostic studies:  Ct Abdomen Pelvis Wo Contrast  Result Date: 03/09/2019 CLINICAL DATA:  68 year old male with recent gastric surgery (Billroth 1) presenting with vomiting. EXAM: CT ABDOMEN AND PELVIS WITHOUT CONTRAST TECHNIQUE: Multidetector CT imaging of the abdomen and pelvis was performed following the standard protocol without IV contrast. COMPARISON:  Abdominal radiograph dated 03/05/2019 and CT dated 02/24/2019 FINDINGS: Evaluation of this exam is limited in the absence of intravenous contrast. Lower chest: Emphysematous changes of the visualized lung bases. No intra-abdominal free air or free fluid. Hepatobiliary: The liver is unremarkable. No intrahepatic biliary ductal dilatation. There is layering sludge within the gallbladder. No pericholecystic fluid or evidence of acute cholecystitis. Pancreas: Unremarkable. No pancreatic ductal dilatation or surrounding inflammatory changes. Spleen: Normal in size without focal abnormality. Adrenals/Urinary Tract: The adrenal glands are  unremarkable. There is no hydronephrosis or nephrolithiasis on either side. A 12 mm hypodense lesion in the interpolar aspect of the right kidney is not well characterized but may represent a cyst. The visualized ureters appear unremarkable. The urinary bladder is grossly unremarkable. Stomach/Bowel: Postsurgical changes of partial gastrectomy and gastroduodenostomy. No evidence of obstruction at the gastrojejunostomy. Mildly dilated proximal small bowel loops without evidence of obstruction most likely represent an ileus. A percutaneous jejunostomy tube is noted. There is scattered sigmoid diverticula without active inflammatory changes. The appendix is normal. Vascular/Lymphatic: Moderate atherosclerotic calcification of the aorta. The IVC is unremarkable. No portal venous gas. There is no adenopathy. A percutaneous drainage tube is noted with tip in the left anterior abdomen anterior to the stomach. No drainable fluid collection. Reproductive: The prostate and seminal vesicles are grossly unremarkable. No pelvic mass. Other: Midline vertical anterior abdominal wall surgical incision and cutaneous clips. No fluid collection. Musculoskeletal: No acute or significant osseous findings. IMPRESSION: 1. Postsurgical changes of partial gastrectomy and gastroduodenostomy. No evidence of obstruction at the gastrojejunostomy. 2. Mildly dilated proximal small bowel likely ileus. No evidence of obstruction. 3. Colonic diverticulosis. Normal appendix. 4. Aortic Atherosclerosis (ICD10-I70.0). Electronically Signed   By: Anner Crete M.D.   On: 03/09/2019 18:34    Medications:   . Chlorhexidine Gluconate Cloth  6 each Topical Daily  . heparin  5,000 Units Subcutaneous Q8H  . insulin aspart  0-9 Units Subcutaneous TID WC  . nicotine  21 mg Transdermal Daily   Continuous Infusions: . 0.9 % NaCl with KCl 40 mEq / L 75 mL/hr (03/11/19 0949)     LOS: 2 days   Serigne Kubicek  [facility]   *Please refer to  [email or web address], password [email or web address] to get updated schedule on who will round on this patient, as hospitalists switch teams weekly. If 7PM-7AM, please contact night-coverage at [email or web address], password [email or web address] for any overnight needs.  03/11/2019, 2:53 PM

## 2019-03-12 LAB — BASIC METABOLIC PANEL
Anion gap: 8 (ref 5–15)
BUN: 16 mg/dL (ref 8–23)
CO2: 20 mmol/L — ABNORMAL LOW (ref 22–32)
Calcium: 7.9 mg/dL — ABNORMAL LOW (ref 8.9–10.3)
Chloride: 103 mmol/L (ref 98–111)
Creatinine, Ser: 1.12 mg/dL (ref 0.61–1.24)
GFR calc Af Amer: >60 mL/min (ref >60)
GFR calc non Af Amer: >60 mL/min (ref >60)
Glucose, Bld: 82 mg/dL (ref 70–99)
Potassium: 4.4 mmol/L (ref 3.5–5.1)
Sodium: 131 mmol/L — ABNORMAL LOW (ref 135–145)

## 2019-03-12 LAB — GLUCOSE, CAPILLARY
Glucose-Capillary: 83 mg/dL (ref 70–99)
Glucose-Capillary: 98 mg/dL (ref 70–99)

## 2019-03-12 NOTE — Progress Notes (Signed)
Initial Nutrition Assessment  DOCUMENTATION CODES:   Non-severe (moderate) malnutrition in context of social or environmental circumstances  INTERVENTION:   Ensure Enlive po TID, each supplement provides 350 kcal and 20 grams of protein  Post gastrectomy diet education   NUTRITION DIAGNOSIS:   Moderate Malnutrition related to social / environmental circumstances as evidenced by moderate fat depletion, moderate muscle depletion, severe muscle depletion.  GOAL:   Patient will meet greater than or equal to 90% of their needs  MONITOR:   PO intake, Supplement acceptance, Labs, Weight trends, Skin, I & O's  REASON FOR ASSESSMENT:   Consult Diet education  ASSESSMENT:   68 y.o. black male with cocaine abuse, duodenal ulcer, gastric ulcer, hypertension, paroxysmal atrial fibrillation, tobacco abuse, who was admitted to [facility] on 03/09/2019 for evaluation of nausea, vomiting, dehydration and acute kidney injury s/p antrectomy with Bilroth 1 reconstruction, vagotomy, and feeding jejunostomy tube placement for GOO and PUD 11/18. J-tube clogged and unable to use.   Visited pt's room 11/30. Pt is well known to nutrition department and this RD from a recent previous admit. Pt with poor oral intake at baseline per his report. Pt reports a gradual decline in his appetite over the past few years. Pt reports that he has lost from 150lbs which is is UBW but he can't remember when he last weighed this. Per chart, pt has remained weight stable since his last admit. Pt reports weakness and poor po after his last discharge (left AMA) on 11/25. Pt reports cooking a large thanksgiving meal on Thursday; pt reports being able to eat some of the meal. Pt developed abdominal pain, nausea and vomiting on Friday and returned to the hospital; pt found to have suspected ileus as well as some dumping. Pt eating lunch at time or RD visit yesterday. Pt was very unhappy that he had received cream of chicken soup and  yogurt which he does not like. Pt reports that he is hungry and wanting to eat. Pt ate 100% of his breakfast this morning. Pt is amendable to drinking supplements; RD will order. Pt with poor dentition; will order dysphagia 3 diet.     RD provided patient with post gastrectomy diet education today focusing mainly on eating small meals throughout the day, limiting drinking a lot of fluid with meals, avoiding alcohol and concentrated sweets, sitting up after eating and cutting foods in to small pieces prior to eating as pt has poor dentition and is unable to chew well. Pt verbalized understanding. Pt was provided handouts with supporting information.   Medications reviewed and include: heparin, insulin, nicotine  Labs reviewed: Na 131(L), Mg 2.1 wnl Hgb 10.4(L), Hct 29.6(L)  NUTRITION - FOCUSED PHYSICAL EXAM:    Most Recent Value  Orbital Region  Moderate depletion  Upper Arm Region  Severe depletion  Thoracic and Lumbar Region  Moderate depletion  Buccal Region  Moderate depletion  Temple Region  Severe depletion  Clavicle Bone Region  Severe depletion  Clavicle and Acromion Bone Region  Severe depletion  Scapular Bone Region  Moderate depletion  Dorsal Hand  Moderate depletion  Patellar Region  Moderate depletion  Anterior Thigh Region  Moderate depletion  Posterior Calf Region  Severe depletion  Edema (RD Assessment)  None  Hair  Reviewed  Eyes  Reviewed  Mouth  Reviewed  Skin  Reviewed  Nails  Reviewed     Diet Order:   Diet Order            Diet  regular Room service appropriate? Yes; Fluid consistency: Thin  Diet effective now        Diet general             EDUCATION NEEDS:   Education needs have been addressed  Skin:  Skin Assessment: Reviewed RN Assessment(closed incision abdomen)  Last BM:  11/30- type 5  Height:   Ht Readings from Last 1 Encounters:  03/09/19 5\' 7"  (1.702 m)    Weight:   Wt Readings from Last 1 Encounters:  03/12/19 55.6 kg     Ideal Body Weight:  67.3 kg  BMI:  Body mass index is 19.19 kg/m.  Estimated Nutritional Needs:   Kcal:  1700-2000kcal/day  Protein:  85-100g/day  Fluid:  1.5-1.8L/day  Koleen Distance MS, RD, LDN Pager #- [phone] Office#- [phone] After Hours Pager: [phone]

## 2019-03-12 NOTE — Progress Notes (Signed)
[facility] Hospital Day(s): 3.   Post op day(s): 13 days   Interval History:  Patient seen and examined no acute events or new complaints overnight.  Patient reports he is feeling good this morning No fever, chills, nausea, or emesis Renal function and potassium normalized this morning, U/O - 1.78L measured Tolerated full liquid bariatric without issue, + BM x2  Mobilizing   Vital signs in last 24 hours: [min-max] current  Temp:  [97.8 F (36.6 C)-98.2 F (36.8 C)] 98.2 F (36.8 C) (12/01 0428) Pulse Rate:  [45-55] 55 (12/01 0428) Resp:  [18-19] 18 (12/01 0428) BP: (123-158)/(80-101) 130/97 (12/01 0428) SpO2:  [99 %-100 %] 99 % (12/01 0428) Weight:  [55.6 kg] 55.6 kg (12/01 0428)     Height: 5\' 7"  (170.2 cm) Weight: 55.6 kg BMI (Calculated): 19.18   Intake/Output last 2 shifts:  11/30 0701 - 12/01 0700 In: 2003.7 [P.O.:540; I.V.:1463.7] Out: 1800 [Urine:1800]   Physical Exam:  Constitutional: alert, cooperative and no distress  Respiratory: breathing non-labored at rest  Cardiovascular: regular rate and sinus rhythm  Gastrointestinal: soft, incisional soreness, and non-distended. Jejunostomy tube in left abdomen, no drainage Integumentary: Midline laparotomy incision healing well, staples removed, drainage to the inferior portion, serosanguinous, no purulence.   Labs:  CBC Latest Ref Rng & Units 03/11/2019 03/10/2019 03/09/2019  WBC 4.0 - 10.5 K/uL 9.0 18.2(H) 25.5(H)  Hemoglobin 13.0 - 17.0 g/dL 10.4(L) 11.3(L) 12.5(L)  Hematocrit 39.0 - 52.0 % 29.6(L) 32.2(L) 35.8(L)  Platelets 150 - 400 K/uL 465(H) 495(H) 545(H)   CMP Latest Ref Rng & Units 03/12/2019 03/11/2019 03/10/2019  Glucose 70 - 99 mg/dL 82 105(H) 85  BUN 8 - 23 mg/dL 16 36(H) 61(H)  Creatinine 0.61 - 1.24 mg/dL 1.12 1.53(H) 3.92(H)  Sodium 135 - 145 mmol/L 131(L) 131(L) 131(L)  Potassium 3.5 - 5.1 mmol/L 4.4 3.0(L) 3.3(L)  Chloride 98 - 111 mmol/L 103 96(L)  93(L)  CO2 22 - 32 mmol/L 20(L) 24 24  Calcium 8.9 - 10.3 mg/dL 7.9(L) 7.9(L) 7.9(L)  Total Protein 6.5 - 8.1 g/dL - - 6.8  Total Bilirubin 0.3 - 1.2 mg/dL - - 0.6  Alkaline Phos 38 - 126 U/L - - 66  AST 15 - 41 U/L - - 13(L)  ALT 0 - 44 U/L - - 10     Imaging studies: No new pertinent imaging studies   Assessment/Plan:  68 y.o. male overall improved with normalization of renal function and electrolytes 13 days s/p antrectomy with Bilroth 1 reconstruction, vagotomy, and feeding jejunostomy tube placementfor GOOand PUD   - Advance to bariatric regular diet this morning  - Renal function normalization, should be able discontinue IVF   - pain control prn; antiemetics prn             - monitor abdominal examiantion              - continue dressing changes to midline wound daily + prn             - continue to mobilize    - Discharge Planning: If tolerates advancement of diet he may be able to discharge this afternoon vs tomorrow morning pending medical condition, continue dry dressing to midline wound, reviewed post-op care,  Follow up with Dr Dahlia Byes in ~2 weeks.  All of the above findings and recommendations were discussed with the patient, and the medical team, and all of patient's questions were answered to his expressed satisfaction.  -- Alroy Dust  [name] [facility] 03/12/2019, 7:44 AM [phone] M-F: 7am - 4pm

## 2019-03-12 NOTE — Progress Notes (Signed)
[facility]  ROUNDING NOTE   Subjective:   No complaints  Objective:  Vital signs in last 24 hours:  Temp:  [97.8 F (36.6 C)-98.2 F (36.8 C)] 98.1 F (36.7 C) (12/01 0823) Pulse Rate:  [52-55] 53 (12/01 0823) Resp:  [18-19] 19 (12/01 0823) BP: (130-158)/(87-101) 140/87 (12/01 0823) SpO2:  [97 %-100 %] 97 % (12/01 0823) Weight:  [55.6 kg] 55.6 kg (12/01 0428)  Weight change: -0.934 kg Filed Weights   03/09/19 2150 03/11/19 0515 03/12/19 0428  Weight: 53.8 kg 56.5 kg 55.6 kg    Intake/Output: I/O last 3 completed shifts: In: 2003.7 [P.O.:540; I.V.:1463.7] Out: [id number] [Urine:38400]   Intake/Output this shift:  Total I/O In: 240 [P.O.:240] Out: 450 [Urine:450]  Physical Exam: General: NAD, laying in bed  Head: Normocephalic, atraumatic. Moist oral mucosal membranes  Eyes: Anicteric, PERRL  Neck: Supple, trachea midline  Lungs:  Clear to auscultation  Heart: Regular rate and rhythm  Abdomen:  Soft, nontender,   Extremities: no peripheral edema.  Neurologic: Nonfocal, moving all four extremities  Skin: No lesions        Basic Metabolic Panel: Recent Labs  Lab 03/06/19 0446 03/09/19 1547 03/09/19 2024 03/10/19 0514 03/11/19 0618 03/12/19 0552  NA 132* 131*  --  131* 131* 131*  K 3.7 3.4*  --  3.3* 3.0* 4.4  CL 105 75*  --  93* 96* 103  CO2 19* 30  --  24 24 20*  GLUCOSE 115* 119*  --  85 105* 82  BUN 10 63*  --  61* 36* 16  CREATININE 0.83 6.34* 5.73* 3.92* 1.53* 1.12  CALCIUM 8.5* 10.4*  --  7.9* 7.9* 7.9*  MG 1.8  --   --   --  2.1  --     Liver Function Tests: Recent Labs  Lab 03/09/19 1547 03/10/19 0514  AST 19 13*  ALT 12 10  ALKPHOS 101 66  BILITOT 0.7 0.6  PROT 10.9* 6.8  ALBUMIN 5.3* 3.3*   Recent Labs  Lab 03/09/19 1547 03/10/19 0514  LIPASE 386* 152*   No results for input(s): AMMONIA in the last 168 hours.  CBC: Recent Labs  Lab 03/06/19 0446 03/09/19 1547 03/09/19 2024 03/10/19 0515 03/11/19 0618  WBC  13.1* 24.4* 25.5* 18.2* 9.0  NEUTROABS  --   --   --  14.0* 5.8  HGB 11.3* 15.3 12.5* 11.3* 10.4*  HCT 31.9* 44.0 35.8* 32.2* 29.6*  MCV 81.8 81.8 82.5 82.1 82.7  PLT 383 684* 545* 495* 465*    Cardiac Enzymes: No results for input(s): CKTOTAL, CKMB, CKMBINDEX, TROPONINI in the last 168 hours.  BNP: Invalid input(s): POCBNP  CBG: Recent Labs  Lab 03/11/19 1158 03/11/19 1651 03/11/19 2126 03/12/19 0824 03/12/19 1130  GLUCAP 80 80 83 83 98    Microbiology: Results for orders placed or performed during the hospital encounter of 03/09/19  SARS CORONAVIRUS 2 (TAT 6-24 HRS) Nasopharyngeal Nasopharyngeal Swab     Status: None   Collection Time: 03/09/19  7:13 PM   Specimen: Nasopharyngeal Swab  Result Value Ref Range Status   SARS Coronavirus 2 NEGATIVE NEGATIVE Final    Comment: (NOTE) SARS-CoV-2 target nucleic acids are NOT DETECTED. The SARS-CoV-2 RNA is generally detectable in upper and lower respiratory specimens during the acute phase of infection. Negative results do not preclude SARS-CoV-2 infection, do not rule out co-infections with other pathogens, and should not be used as the sole basis for treatment or other patient management decisions.  Negative results must be combined with clinical observations, patient history, and epidemiological information. The expected result is Negative. Fact Sheet for Patients: SugarRoll.be Fact Sheet for Healthcare Providers: https://www.woods-mathews.com/ This test is not yet approved or cleared by the [place] FDA and  has been authorized for detection and/or diagnosis of SARS-CoV-2 by FDA under an Emergency Use Authorization (EUA). This EUA will remain  in effect (meaning this test can be used) for the duration of the COVID-19 declaration under Section 56 4(b)(1) of the Act, 21 U.S.C. section 360bbb-3(b)(1), unless the authorization is terminated or revoked sooner. Performed at  [facility], [place] [address], [place], [place] [address]     Coagulation Studies: No results for input(s): LABPROT, INR in the last 72 hours.  Urinalysis: Recent Labs    03/09/19 2250  COLORURINE YELLOW*  LABSPEC 1.016  PHURINE 5.0  GLUCOSEU NEGATIVE  HGBUR SMALL*  BILIRUBINUR NEGATIVE  KETONESUR NEGATIVE  PROTEINUR 30*  NITRITE NEGATIVE  LEUKOCYTESUR NEGATIVE      Imaging: No results found.   Medications:    . Chlorhexidine Gluconate Cloth  6 each Topical Daily  . feeding supplement (ENSURE ENLIVE)  237 mL Oral TID BM  . heparin  5,000 Units Subcutaneous Q8H  . insulin aspart  0-9 Units Subcutaneous TID WC  . nicotine  21 mg Transdermal Daily  . sodium chloride flush  3 mL Intravenous Q12H   acetaminophen **OR** acetaminophen, albuterol, iohexol, ondansetron **OR** ondansetron (ZOFRAN) IV  Assessment/ Plan:  Keith [name] is a 68 y.o. black male with cocaine abuse, duodenal ulcer, gastric ulcer, hypertension, paroxysmal atrial fibrillation, tobacco abuse, who was admitted to [facility] on 03/09/2019 for evaluation of nausea, vomiting, dehydration and acute kidney injury status post recent Billroth type I procedure.   1.  Acute kidney injury due to prolonged nausea/vomiting and subsequent dehydration. Baseline creatinine of 0.83 on 03/06/19.  Prerenal azotemia.  2.  Hyponatremia, hypovolemic. 3.  Hypokalemia due to GI losses. 4.  Status post recent Billroth type I procedure.  Plan: Will need outpatient follow up with nephrology   LOS: 3 [name] 12/1/202012:19 PM

## 2019-03-12 NOTE — Progress Notes (Signed)
Discharge instructions given to patient and patient verbalized understanding

## 2019-03-12 NOTE — Discharge Summary (Signed)
Physician Discharge Summary  Keith [name] [id number] DOB: 10/26/1950 DOA: 03/09/2019  PCP: Keith [name]  Admit date: 03/09/2019 Discharge date: 03/12/2019  Discharge disposition: Home   Recommendations for Outpatient Follow-Up:    Outpatient follow-up with Dr. Dahlia Byes in 2 weeks   Discharge Diagnosis:   Active Problems:   Acute renal failure ([place])   Ileus following gastrointestinal surgery (H[facility])   Nausea and vomiting    Discharge Condition: Stable.  Diet recommendation: Regular  Code status: Full code.    Hospital Course:    Keith [name] is a 68 y.o. male with medical history significant for P Afib not on anticoagulation, tobacco abuse, history of cocaine abuse in remission x 6 mo, hypertension, gastritis and gastric ulcers, status post recent antrectomy with Bilroth 1 reconstruction, vagotomy and jejunostomy feeding tube placement on 11/18.  He left the hospital [name] on 03/06/2019.  He presented to the hospital again on 03/09/2019 with abdominal pain, intractable nausea and vomiting.  He was found to have severe acute kidney injury and severe leukocytosis.  He was treated with IV fluids, antibiotics and analgesics.  He was seen in consultation by the general surgeon and the nephrologist.  Exact etiology of nausea and vomiting is not clear but the surgeon suspects it may be from dumping syndrome.  AKI, nausea vomiting, abdominal pain and leukocytosis have resolved and patient has been able to tolerate a regular diet.  Patient also has hyponatremia but it appears this is chronic.  Patient was found to have sinus bradycardia on telemetry but he was asymptomatic from this.  Patient is deemed medically stable for discharge to home.    Medical Consultants:    General surgeon, Dr. Benjamine Sprague.  Nephrology, Dr. Marguarite Arbour and Dr. Juleen China   Discharge Exam:   Vitals:   03/12/19 0428 03/12/19 0823  BP: (!) 130/97 140/87  Pulse: (!) 55 (!) 53    Resp: 18 19  Temp: 98.2 F (36.8 C) 98.1 F (36.7 C)  SpO2: 99% 97%   Vitals:   03/11/19 1653 03/11/19 1923 03/12/19 0428 03/12/19 0823  BP: (!) 134/101 (!) 158/93 (!) 130/97 140/87  Pulse: (!) 52 (!) 55 (!) 55 (!) 53  Resp: 19  18 19   Temp: 97.8 F (36.6 C) 98.1 F (36.7 C) 98.2 F (36.8 C) 98.1 F (36.7 C)  TempSrc: Oral Oral Oral Oral  SpO2: 100% 100% 99% 97%  Weight:   55.6 kg   Height:         GEN: NAD SKIN: No rash EYES: no pallor or icterus ENT: MMM CV: RRR PULM: CTA B ABD: surgical wound looks, soft, non tender, +BS, +jejunostomy tube CNS: AAO x 3, non focal EXT: No edema or tenderness   The results of significant diagnostics from this hospitalization (including imaging, microbiology, ancillary and laboratory) are listed below for reference.     Procedures and Diagnostic Studies:   Ct Abdomen Pelvis Wo Contrast  Result Date: 03/09/2019 CLINICAL DATA:  68 year old male with recent gastric surgery (Billroth 1) presenting with vomiting. EXAM: CT ABDOMEN AND PELVIS WITHOUT CONTRAST TECHNIQUE: Multidetector CT imaging of the abdomen and pelvis was performed following the standard protocol without IV contrast. COMPARISON:  Abdominal radiograph dated 03/05/2019 and CT dated 02/24/2019 FINDINGS: Evaluation of this exam is limited in the absence of intravenous contrast. Lower chest: Emphysematous changes of the visualized lung bases. No intra-abdominal free air or free fluid. Hepatobiliary: The liver is unremarkable. No intrahepatic biliary ductal dilatation.  There is layering sludge within the gallbladder. No pericholecystic fluid or evidence of acute cholecystitis. Pancreas: Unremarkable. No pancreatic ductal dilatation or surrounding inflammatory changes. Spleen: Normal in size without focal abnormality. Adrenals/Urinary Tract: The adrenal glands are unremarkable. There is no hydronephrosis or nephrolithiasis on either side. A 12 mm hypodense lesion in the interpolar  aspect of the right kidney is not well characterized but may represent a cyst. The visualized ureters appear unremarkable. The urinary bladder is grossly unremarkable. Stomach/Bowel: Postsurgical changes of partial gastrectomy and gastroduodenostomy. No evidence of obstruction at the gastrojejunostomy. Mildly dilated proximal small bowel loops without evidence of obstruction most likely represent an ileus. A percutaneous jejunostomy tube is noted. There is scattered sigmoid diverticula without active inflammatory changes. The appendix is normal. Vascular/Lymphatic: Moderate atherosclerotic calcification of the aorta. The IVC is unremarkable. No portal venous gas. There is no adenopathy. A percutaneous drainage tube is noted with tip in the left anterior abdomen anterior to the stomach. No drainable fluid collection. Reproductive: The prostate and seminal vesicles are grossly unremarkable. No pelvic mass. Other: Midline vertical anterior abdominal wall surgical incision and cutaneous clips. No fluid collection. Musculoskeletal: No acute or significant osseous findings. IMPRESSION: 1. Postsurgical changes of partial gastrectomy and gastroduodenostomy. No evidence of obstruction at the gastrojejunostomy. 2. Mildly dilated proximal small bowel likely ileus. No evidence of obstruction. 3. Colonic diverticulosis. Normal appendix. 4. Aortic Atherosclerosis (ICD10-I70.0). Electronically Signed   By: Anner Crete M.D.   On: 03/09/2019 18:34     Labs:   Basic Metabolic Panel: Recent Labs  Lab 03/06/19 0446 03/09/19 1547 03/09/19 2024 03/10/19 0514 03/11/19 0618 03/12/19 0552  NA 132* 131*  --  131* 131* 131*  K 3.7 3.4*  --  3.3* 3.0* 4.4  CL 105 75*  --  93* 96* 103  CO2 19* 30  --  24 24 20*  GLUCOSE 115* 119*  --  85 105* 82  BUN 10 63*  --  61* 36* 16  CREATININE 0.83 6.34* 5.73* 3.92* 1.53* 1.12  CALCIUM 8.5* 10.4*  --  7.9* 7.9* 7.9*  MG 1.8  --   --   --  2.1  --    GFR Estimated  Creatinine Clearance: 49.6 mL/min (by C-G formula based on SCr of 1.12 mg/dL). Liver Function Tests: Recent Labs  Lab 03/09/19 1547 03/10/19 0514  AST 19 13*  ALT 12 10  ALKPHOS 101 66  BILITOT 0.7 0.6  PROT 10.9* 6.8  ALBUMIN 5.3* 3.3*   Recent Labs  Lab 03/09/19 1547 03/10/19 0514  LIPASE 386* 152*   No results for input(s): AMMONIA in the last 168 hours. Coagulation profile No results for input(s): INR, PROTIME in the last 168 hours.  CBC: Recent Labs  Lab 03/06/19 0446 03/09/19 1547 03/09/19 2024 03/10/19 0515 03/11/19 0618  WBC 13.1* 24.4* 25.5* 18.2* 9.0  NEUTROABS  --   --   --  14.0* 5.8  HGB 11.3* 15.3 12.5* 11.3* 10.4*  HCT 31.9* 44.0 35.8* 32.2* 29.6*  MCV 81.8 81.8 82.5 82.1 82.7  PLT 383 684* 545* 495* 465*   Cardiac Enzymes: No results for input(s): CKTOTAL, CKMB, CKMBINDEX, TROPONINI in the last 168 hours. BNP: Invalid input(s): POCBNP CBG: Recent Labs  Lab 03/11/19 0758 03/11/19 1158 03/11/19 1651 03/11/19 2126 03/12/19 0824  GLUCAP 98 80 80 83 83   D-Dimer No results for input(s): DDIMER in the last 72 hours. Hgb A1c Recent Labs    03/09/19 2024  HGBA1C 6.4*  Lipid Profile No results for input(s): CHOL, HDL, LDLCALC, TRIG, CHOLHDL, LDLDIRECT in the last 72 hours. Thyroid function studies Recent Labs    03/09/19 2024  TSH 6.872*   Anemia work up No results for input(s): VITAMINB12, FOLATE, FERRITIN, TIBC, IRON, RETICCTPCT in the last 72 hours. Microbiology Recent Results (from the past 240 hour(s))  SARS CORONAVIRUS 2 (TAT 6-24 HRS) Nasopharyngeal Nasopharyngeal Swab     Status: None   Collection Time: 03/09/19  7:13 PM   Specimen: Nasopharyngeal Swab  Result Value Ref Range Status   SARS Coronavirus 2 NEGATIVE NEGATIVE Final    Comment: (NOTE) SARS-CoV-2 target nucleic acids are NOT DETECTED. The SARS-CoV-2 RNA is generally detectable in upper and lower respiratory specimens during the acute phase of infection.  Negative results do not preclude SARS-CoV-2 infection, do not rule out co-infections with other pathogens, and should not be used as the sole basis for treatment or other patient management decisions. Negative results must be combined with clinical observations, patient history, and epidemiological information. The expected result is Negative. Fact Sheet for Patients: SugarRoll.be Fact Sheet for Healthcare Providers: https://www.woods-mathews.com/ This test is not yet approved or cleared by the [place] FDA and  has been authorized for detection and/or diagnosis of SARS-CoV-2 by FDA under an Emergency Use Authorization (EUA). This EUA will remain  in effect (meaning this test can be used) for the duration of the COVID-19 declaration under Section 56 4(b)(1) of the Act, 21 U.S.C. section 360bbb-3(b)(1), unless the authorization is terminated or revoked sooner. Performed at [facility], [place] [address] [place], [place] [address]      Discharge Instructions:   Discharge Instructions    Diet general   Complete by: As directed    Discharge wound care:   Complete by: As directed    continue dressing changes to abdominal midline wound daily   Increase activity slowly   Complete by: As directed      Allergies as of 03/12/2019   No Known Allergies     Medication List    You have not been prescribed any medications.          Discharge Care Instructions  (From admission, onward)         Start     Ordered   03/12/19 0000  Discharge wound care:    Comments: continue dressing changes to abdominal midline wound daily   03/12/19 1010            Time coordinating discharge: 27 minutes  Signed:  Caitlin Hillmer  [facility] 03/12/2019, 10:10 AM

## 2019-03-12 NOTE — Care Management Important Message (Signed)
Important Message  Patient Details  Name: Keith [name] MRN: [id number] Date of Birth: [birth year]   Medicare Important Message Given:  Yes     Dannette Barbara 03/12/2019, 11:15 AM

## 2019-03-14 ENCOUNTER — Ambulatory Visit: Payer: Medicare HMO | Admitting: Nurse Practitioner

## 2019-03-14 NOTE — Progress Notes (Deleted)
Office Visit    Patient Name: Keith [name] Date of Encounter: 03/14/2019  Primary Care Provider:  Patient, No Pcp Per Primary Cardiologist:  No primary care provider on file.  Chief Complaint    68 year old male with PMH of PAF not on anticoagulation 2/2 gastric outlet obstruction 2/2 deep ulcerations, CKD stage II, polysubstance abuse with cocaine use, COPD with ongoing tobacco abuse, syncope felt to be vasovagal versus orthostatic, hypertension, and noncompliance, and who is being seen today for hospital follow-up.  Past Medical History    Past Medical History:  Diagnosis Date   CKD (chronic kidney disease), stage II    Cocaine abuse ([place])    Duodenal ulcer    Gastric ulcer    Hypertension    Noncompliance    Paroxysmal atrial fibrillation ([place])    Tobacco abuse    Past Surgical History:  Procedure Laterality Date   ESOPHAGOGASTRODUODENOSCOPY (EGD) WITH PROPOFOL N/A 02/26/2019   Procedure: ESOPHAGOGASTRODUODENOSCOPY (EGD) WITH PROPOFOL;  Surgeon: Lucilla Lame, MD;  Location: [facility] ENDOSCOPY;  Service: Endoscopy;  Laterality: N/A;   GASTROJEJUNOSTOMY N/A 02/27/2019   Procedure: GASTROJEJUNOSTOMY;  Surgeon: Jules Husbands, MD;  Location: [facility] ORS;  Service: General;  Laterality: N/A;    Allergies  No Known Allergies  History of Present Illness    68 year old male with PMH as above and including Afib and polysubstance abuse with cocaine and tobacco use.  He was admitted 07/2017 at [facility] with gastric outlet obstruction secondary to multiple gastric ulcers and [facility] 08/2018 with n/v, inability to tolerate p.o. intake, and syncope.  His syncope was felt to be vasovagal versus orthostatic (despite positive orthostatic vitals) in etiology in the setting of dehydration from days of vomiting.  He was in A. fib on EKG and telemetry with recommendation from GI to avoid oral anticoagulation with ongoing gastric and duodenal ulcers. Troponin of 0.233 and new TWI on EKG was  thought 2/2 demand ischemia.  He underwent EGD as outlined in the discharge summary from [facility].  He presented to [facility] on [facility]/2019 with worsening epigastric pain for 1 to 2 months and inability to tolerate solids/liquids over the last week.  EKG showed sinus rhythm with frequent PACs, LVH with early repolarization abnormality, nonspecific inferior lateral TWI. CXR showed acute bronchitis. UDS was positive for cocaine. HS Tn of 34 with delta of 31. He was also noted to have hypokalemia with at K 2.6, AOCKD with Cr elevated to 2.7, and abnormal thyroid function nwith TSH 4.885, FT4 1.45. Cardiology consulted for evaluation prior to EGD and ruled low risk. Outpatient stress test was recommended and to be arranged at follow-up in the office.   Home Medications    Prior to Admission medications   Not on File    Review of Systems    ***.  All other systems reviewed and are otherwise negative except as noted above.  Physical Exam    VS:  There were no vitals taken for this visit. , BMI There is no height or weight on file to calculate BMI. GEN: Well nourished, well developed, in no acute distress. HEENT: normal. Neck: Supple, no JVD, carotid bruits, or masses. Cardiac: RRR, no murmurs, rubs, or gallops. No clubbing, cyanosis, edema.  Radials/DP/PT 2+ and equal bilaterally.  Respiratory:  Respirations regular and unlabored, clear to auscultation bilaterally. GI: Soft, nontender, nondistended, BS + x 4. MS: no deformity or atrophy. Skin: warm and dry, no rash. Neuro:  Strength and sensation are intact. Psych: Normal affect.  Accessory Clinical Findings    ECG personally reviewed by me today - *** - no acute changes.  Assessment & Plan    1.  ***   Arvil Chaco, PA-C 03/14/2019, 8:10 AM

## 2019-03-25 ENCOUNTER — Ambulatory Visit (INDEPENDENT_AMBULATORY_CARE_PROVIDER_SITE_OTHER): Payer: Medicare HMO | Admitting: Surgery

## 2019-03-25 ENCOUNTER — Other Ambulatory Visit: Payer: Self-pay

## 2019-03-25 ENCOUNTER — Encounter: Payer: Self-pay | Admitting: Surgery

## 2019-03-25 VITALS — BP 142/85 | HR 87 | Temp 97.7°F | Ht 67.0 in | Wt 139.8 lb

## 2019-03-25 DIAGNOSIS — Z09 Encounter for follow-up examination after completed treatment for conditions other than malignant neoplasm: Secondary | ICD-10-CM

## 2019-03-25 MED ORDER — METRONIDAZOLE 500 MG PO TABS: 500.0000 mg | ORAL_TABLET | Freq: Three times a day (TID) | ORAL | 0 refills | Status: AC

## 2019-03-25 MED ORDER — AMOXICILLIN 500 MG PO CAPS: 1000.0000 mg | ORAL_CAPSULE | Freq: Two times a day (BID) | ORAL | 0 refills | Status: AC

## 2019-03-25 MED ORDER — OMEPRAZOLE 20 MG PO TBEC: 20.0000 mg | DELAYED_RELEASE_TABLET | Freq: Two times a day (BID) | ORAL | 0 refills | Status: AC

## 2019-03-25 MED ORDER — CLARITHROMYCIN 500 MG PO TABS: 500.0000 mg | ORAL_TABLET | Freq: Two times a day (BID) | ORAL | 0 refills | Status: AC

## 2019-03-25 NOTE — Progress Notes (Signed)
S/P B1 for GOO Left AMA and was readmitted for ileus and AKI Now much better, no N/V, no fevers or chills Non compliant Post gastrectomy diet d/w pt, he did not see interested  PE NAD Abd: incisions healed, no infection or peritonitis. Red rubber jejunostomy in place.   A/P Doing well We will call PPI and empiric rx H pylori rx F/u 3-4 weeks for jejunostomy removal

## 2019-03-25 NOTE — Patient Instructions (Signed)
Follow up here in 3-4 weeks.

## 2019-03-28 ENCOUNTER — Other Ambulatory Visit: Payer: Self-pay

## 2019-03-28 MED ORDER — OMEPRAZOLE 20 MG PO CPDR: 20.0000 mg | DELAYED_RELEASE_CAPSULE | Freq: Two times a day (BID) | ORAL | 2 refills | Status: DC

## 2019-04-17 ENCOUNTER — Ambulatory Visit (INDEPENDENT_AMBULATORY_CARE_PROVIDER_SITE_OTHER): Payer: Self-pay | Admitting: Surgery

## 2019-04-17 ENCOUNTER — Other Ambulatory Visit: Payer: Self-pay

## 2019-04-17 ENCOUNTER — Encounter: Payer: Self-pay | Admitting: Surgery

## 2019-04-17 VITALS — BP 129/82 | HR 86 | Temp 98.6°F | Resp 12 | Ht 67.0 in | Wt 141.2 lb

## 2019-04-17 DIAGNOSIS — Z09 Encounter for follow-up examination after completed treatment for conditions other than malignant neoplasm: Secondary | ICD-10-CM

## 2019-04-17 NOTE — Progress Notes (Signed)
S/p Billroth I w jejunostomy  7 weeks ago for PUD and GOO Doing very well "Eating like a horse"  No n/v Completed H pylori rx  PE NAD Abd: soft, nt, wound healed. Jejunostomy tube removed.  A/p Doing very well EGD 3-6 months to follow PUD RTC prn

## 2019-04-17 NOTE — Patient Instructions (Signed)
It is going to drain for a couple of days, place a gauze over area. Please follow up as needed. Call the office if you have any questions or concerns.

## 2019-07-30 ENCOUNTER — Other Ambulatory Visit: Payer: Self-pay

## 2019-07-30 DIAGNOSIS — K279 Peptic ulcer, site unspecified, unspecified as acute or chronic, without hemorrhage or perforation: Secondary | ICD-10-CM

## 2019-08-30 ENCOUNTER — Ambulatory Visit: Payer: Medicare HMO | Attending: Internal Medicine

## 2019-08-30 ENCOUNTER — Ambulatory Visit: Payer: Medicare HMO

## 2019-08-30 DIAGNOSIS — Z23 Encounter for immunization: Secondary | ICD-10-CM

## 2019-08-30 NOTE — Progress Notes (Signed)
   Covid-19 Vaccination Clinic  Name:  Keith [name]    MRN: [id number] DOB: 1950/04/27  08/30/2019  Keith [name] was observed post Covid-19 immunization for 15 minutes without incident. He was provided with Vaccine Information Sheet and instruction to access the V-Safe system.   Keith [name] was instructed to call 911 with any severe reactions post vaccine: Marland Kitchen Difficulty breathing  . Swelling of face and throat  . A fast heartbeat  . A bad rash all over body  . Dizziness and weakness   Immunizations Administered    Name Date Dose VIS Date Route   Pfizer COVID-19 Vaccine 08/30/2019 11:30 AM 0.3 mL 06/05/2018 Intramuscular   Manufacturer: [employer] [place]   Lot: [id number]   [place]: [id number]

## 2019-09-20 ENCOUNTER — Ambulatory Visit: Payer: Medicare Other | Attending: Internal Medicine

## 2019-09-20 DIAGNOSIS — Z23 Encounter for immunization: Secondary | ICD-10-CM

## 2019-09-20 NOTE — Progress Notes (Signed)
° °  Covid-19 Vaccination Clinic  Name:  Keith [name]    MRN: [id number] DOB: 1951/04/04  09/20/2019  [name] was observed post Covid-19 immunization for 15 minutes without incident. He was provided with Vaccine Information Sheet and instruction to access the V-Safe system.   [name] was instructed to call 911 with any severe reactions post vaccine:  Difficulty breathing   Swelling of face and throat   A fast heartbeat   A bad rash all over body   Dizziness and weakness   Immunizations Administered    Name Date Dose VIS Date Route   Pfizer COVID-19 Vaccine 09/20/2019 11:27 AM 0.3 mL 06/05/2018 Intramuscular   Manufacturer: [place]   Lot: [id number]   [place]: [id number]

## 2020-01-27 ENCOUNTER — Other Ambulatory Visit: Payer: Self-pay

## 2020-01-27 DIAGNOSIS — K279 Peptic ulcer, site unspecified, unspecified as acute or chronic, without hemorrhage or perforation: Secondary | ICD-10-CM

## 2020-02-05 ENCOUNTER — Encounter: Payer: Self-pay | Admitting: General Practice

## 2021-07-28 IMAGING — DX DG CHEST 1V PORT
1 series · 1 of 1 positions shown · non-contrast
Comparison: None.

CLINICAL DATA: Chest pain and near syncopal episode following a
bowel movement. Nausea and vomiting for the past 2 days. Smoker.

EXAM:
PORTABLE CHEST 1 VIEW

[chest ap]
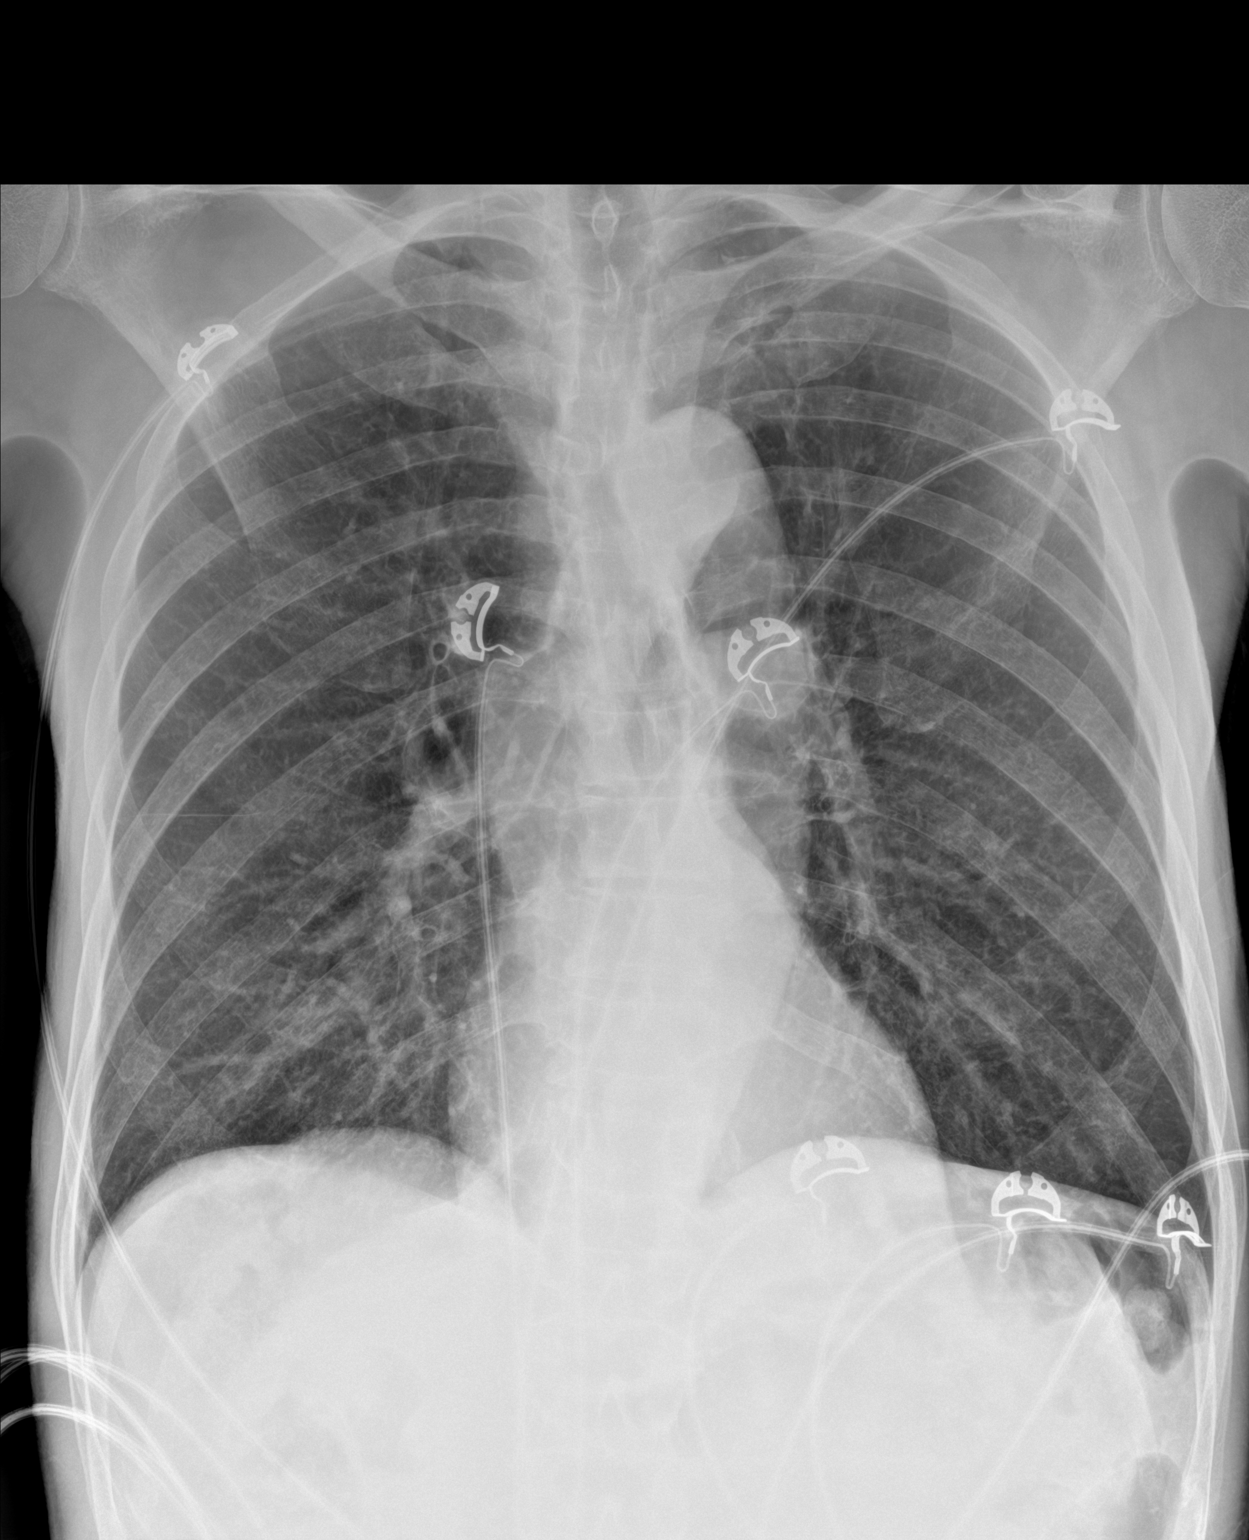

[1 of 1 positions shown; findings below may reference images not displayed]

FINDINGS: Normal sized heart. Tortuous aorta. Moderate central peribronchial
thickening. Hyperexpanded, clear lungs. Unremarkable bones.
IMPRESSION: Moderate changes of COPD and chronic bronchitis. No acute
abnormality.

## 2021-08-11 IMAGING — CT CT ABD-PELV W/O CM
2 of 4 series · 15 of 46 positions shown, 17 images · non-contrast
Comparison: Abdominal radiograph dated 03/05/2019 and CT dated
02/24/2019

CLINICAL DATA: 60-year-old male with recent gastric surgery
(Billroth 1) presenting with vomiting.

EXAM:
CT ABDOMEN AND PELVIS WITHOUT CONTRAST
TECHNIQUE: Multidetector CT imaging of the abdomen and pelvis was performed
following the standard protocol without IV contrast.

[Series 2: routine abd/pel wo · axial · 0.67mm/px · z∈[-442,-107]mm · 12 of 78 slices shown, 14 images]
[im 7/78  soft-tissue]
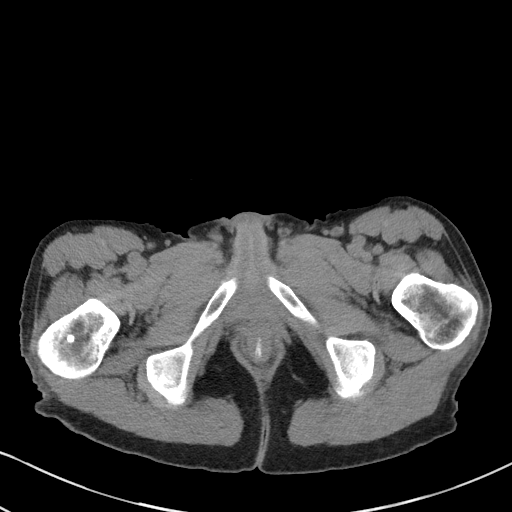
[im 7/78  bone]
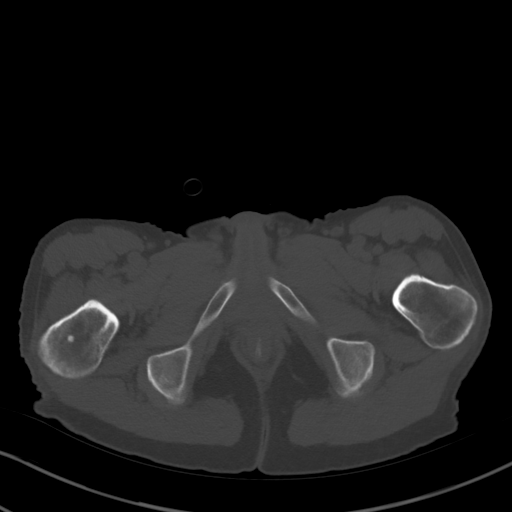
[im 13/78  soft-tissue]
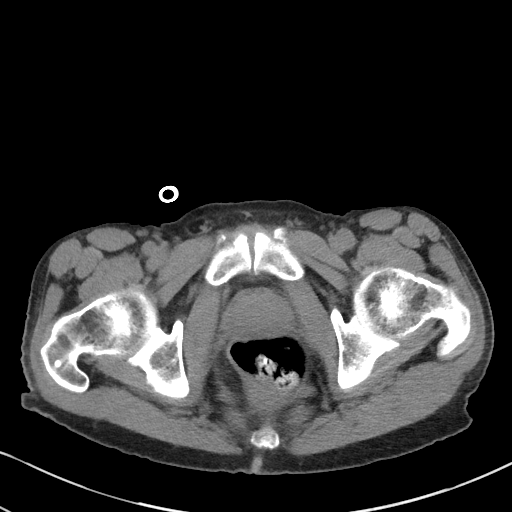
[im 19/78  soft-tissue]
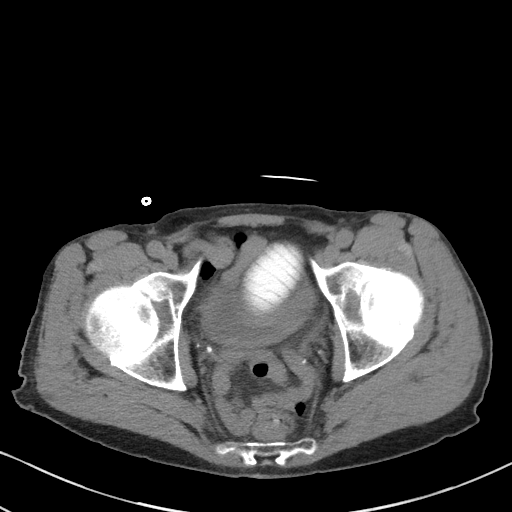
[im 25/78  soft-tissue]
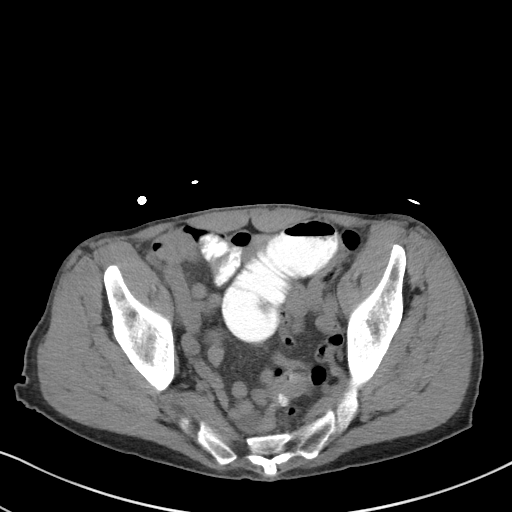
[im 31/78  soft-tissue]
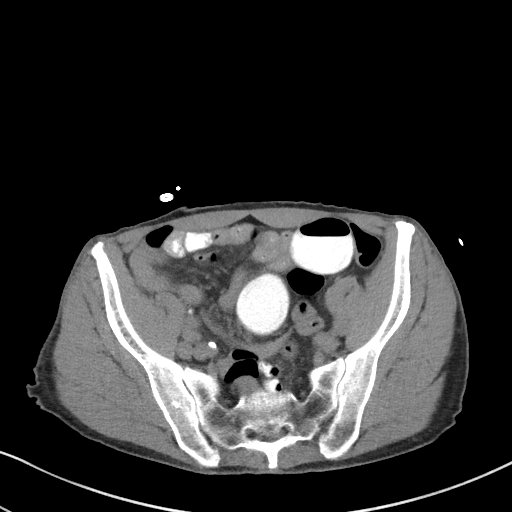
[im 37/78  soft-tissue]
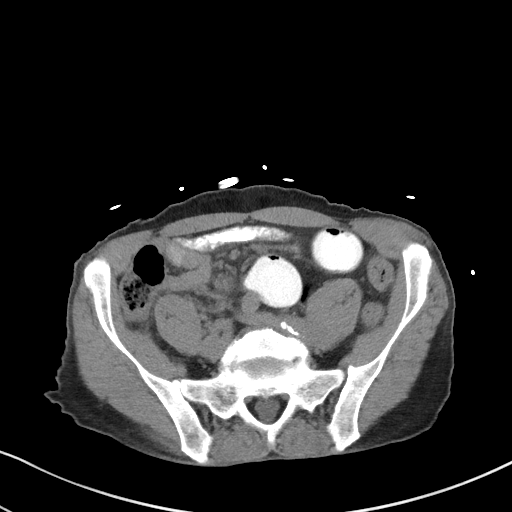
[im 44/78  soft-tissue]
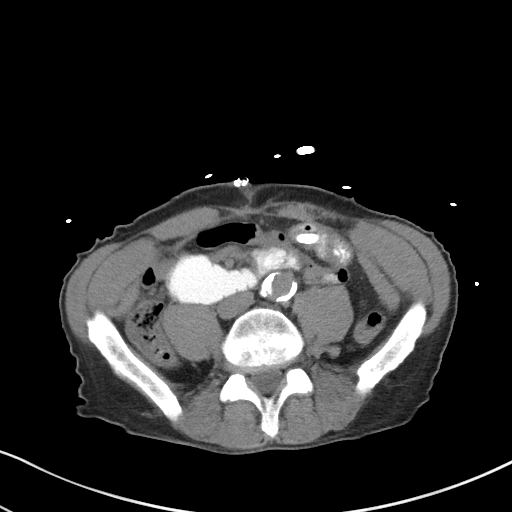
[im 50/78  soft-tissue]
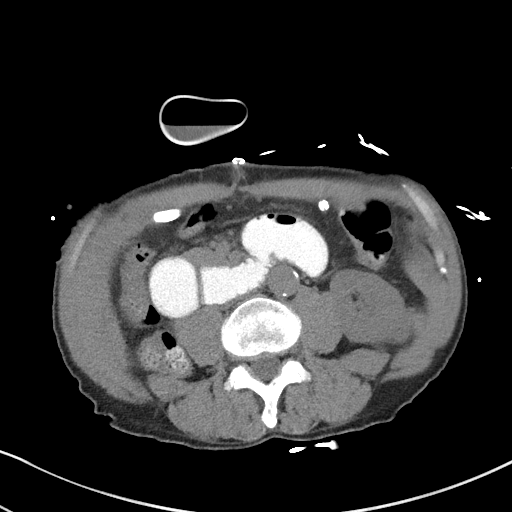
[im 56/78  soft-tissue]
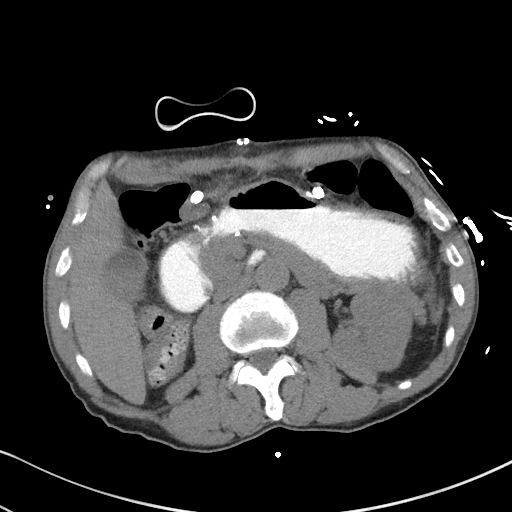
[im 56/78  bone]
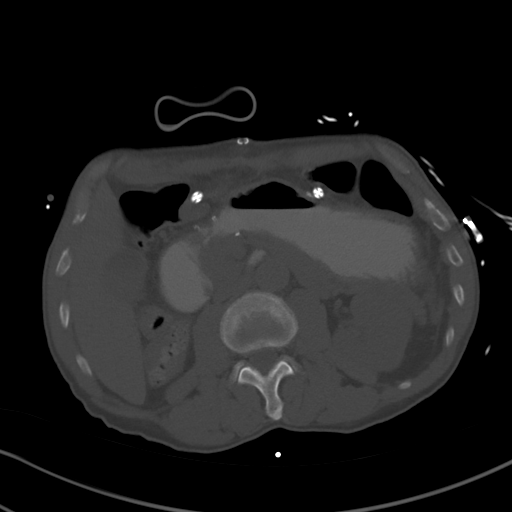
[im 62/78  soft-tissue]
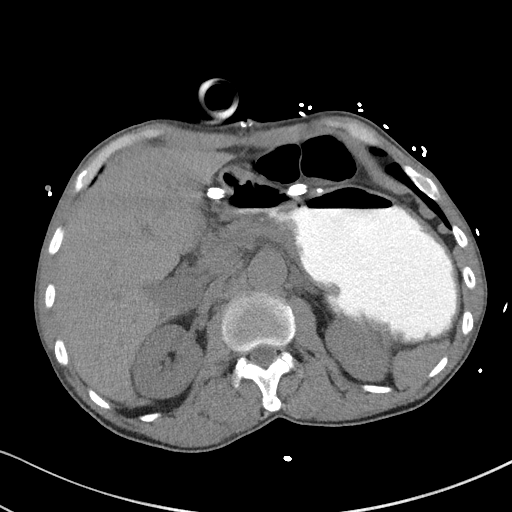
[im 68/78  soft-tissue]
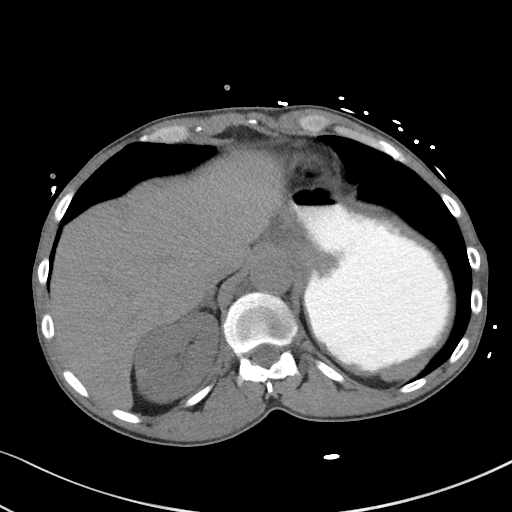
[im 74/78  soft-tissue]
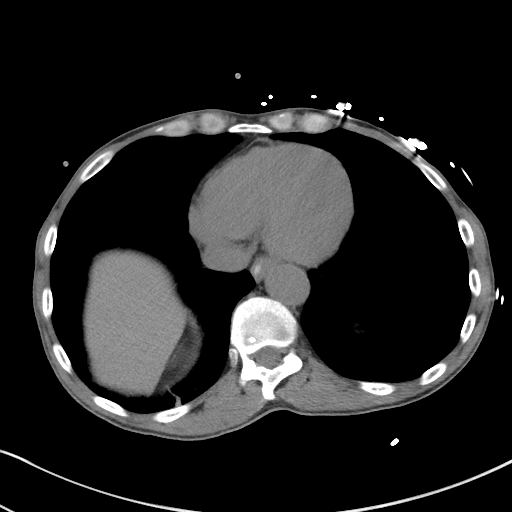

[Series 5: coronal st · coronal · 0.64mm/px · 3 of 84 slices shown]
[im 28/84  soft-tissue]
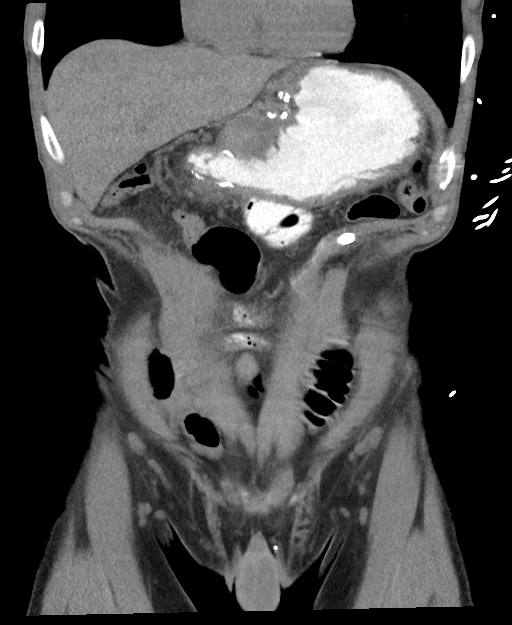
[im 37/84  soft-tissue]
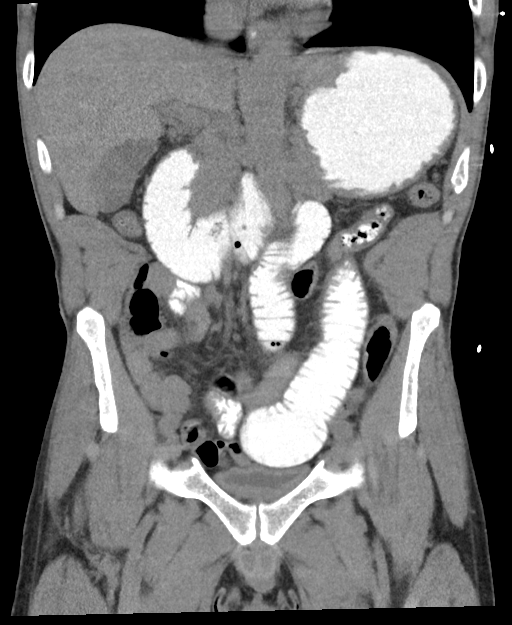
[im 47/84  soft-tissue]
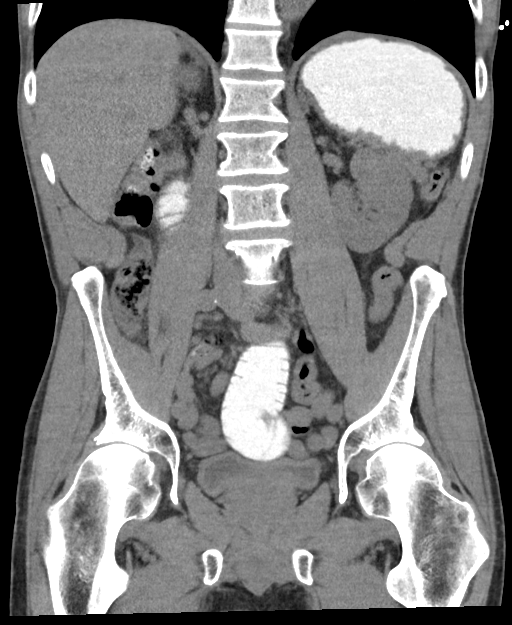

[15 of 46 positions shown; findings below may reference images not displayed]

FINDINGS: Evaluation of this exam is limited in the absence of intravenous
contrast.

Lower chest: Emphysematous changes of the visualized lung bases.

No intra-abdominal free air or free fluid.

Hepatobiliary: The liver is unremarkable. No intrahepatic biliary
ductal dilatation. There is layering sludge within the gallbladder.
No pericholecystic fluid or evidence of acute cholecystitis.

Pancreas: Unremarkable. No pancreatic ductal dilatation or
surrounding inflammatory changes.

Spleen: Normal in size without focal abnormality.

Adrenals/Urinary Tract: The adrenal glands are unremarkable. There
is no hydronephrosis or nephrolithiasis on either side. A 12 mm
hypodense lesion in the interpolar aspect of the right kidney is not
well characterized but may represent a cyst. The visualized ureters
appear unremarkable. The urinary bladder is grossly unremarkable.

Stomach/Bowel: Postsurgical changes of partial gastrectomy and
gastroduodenostomy. No evidence of obstruction at the
gastrojejunostomy. Mildly dilated proximal small bowel loops without
evidence of obstruction most likely represent an ileus. A
percutaneous jejunostomy tube is noted. There is scattered sigmoid
diverticula without active inflammatory changes. The appendix is
normal.

Vascular/Lymphatic: Moderate atherosclerotic calcification of the
aorta. The IVC is unremarkable. No portal venous gas. There is no
adenopathy. A percutaneous drainage tube is noted with tip in the
left anterior abdomen anterior to the stomach. No drainable fluid
collection.

Reproductive: The prostate and seminal vesicles are grossly
unremarkable. No pelvic mass.

Other: Midline vertical anterior abdominal wall surgical incision
and cutaneous clips. No fluid collection.

Musculoskeletal: No acute or significant osseous findings.
IMPRESSION: 1. Postsurgical changes of partial gastrectomy and
gastroduodenostomy. No evidence of obstruction at the
gastrojejunostomy.
2. Mildly dilated proximal small bowel likely ileus. No evidence of
obstruction.
3. Colonic diverticulosis. Normal appendix.
4. Aortic Atherosclerosis (HF2C8-KA3.3).

## 2022-01-27 DIAGNOSIS — N183 Chronic kidney disease, stage 3 unspecified: Secondary | ICD-10-CM | POA: Insufficient documentation

## 2022-01-27 NOTE — Progress Notes (Deleted)
There were no vitals taken for this visit.   Subjective:    Patient ID: Keith [name], male    DOB: July 07, 1950, 71 y.o.   MRN: [id number]  HPI: Keith [name] is a 71 y.o. male  No chief complaint on file.  Patient presents to clinic to establish care with new PCP.  Introduced to Designer, jewellery role and practice setting.  All questions answered.  Discussed provider/patient relationship and expectations.  Patient reports a history of ***. Patient denies a history of: Hypertension, Elevated Cholesterol, Diabetes, Thyroid problems, Depression, Anxiety, Neurological problems, and Abdominal problems.   Active Ambulatory Problems    Diagnosis Date Noted   Atrial fibrillation ([place]) 08/14/2018   Abdominal pain 02/23/2019   Left-sided chest pain 02/23/2019   Essential hypertension 07/25/2017   Tobacco use disorder 05/31/2018   Moderate protein-energy malnutrition ([place]) 02/23/2019   Acute renal failure ([place]) 02/23/2019   Hypokalemia 02/23/2019   Troponin level elevated 02/23/2019   Cocaine use disorder ([place]) 08/15/2018   Duodenal stricture 07/25/2017   Peptic ulcer disease 08/16/2018   Teeth missing, unspecified edentulism 08/15/2018   Unavailability of medical care 03/12/2016   Acute epigastric pain 02/24/2019   Dehydration    Acute gastritis without hemorrhage    Vitamin B12 deficiency 02/25/2019   Intractable vomiting    Postpyloric ulcer    Ileus following gastrointestinal surgery ([place]) 03/09/2019   Nausea and vomiting    Resolved Ambulatory Problems    Diagnosis Date Noted   Chest pain 02/23/2019   Past Medical History:  Diagnosis Date   CKD (chronic kidney disease), stage II    Cocaine abuse ([place])    Duodenal ulcer    Gastric ulcer    Hypertension    Noncompliance    Paroxysmal atrial fibrillation ([facility])    Tobacco abuse    Past Surgical History:  Procedure Laterality Date   ESOPHAGOGASTRODUODENOSCOPY (EGD) WITH PROPOFOL N/A 02/26/2019   Procedure:  ESOPHAGOGASTRODUODENOSCOPY (EGD) WITH PROPOFOL;  Surgeon: Lucilla Lame, MD;  Location: [facility] ENDOSCOPY;  Service: Endoscopy;  Laterality: N/A;   GASTROJEJUNOSTOMY N/A 02/27/2019   Procedure: GASTROJEJUNOSTOMY;  Surgeon: Jules Husbands, MD;  Location: [facility] ORS;  Service: General;  Laterality: N/A;      Review of Systems  Per HPI unless specifically indicated above     Objective:    There were no vitals taken for this visit.  Wt Readings from Last 3 Encounters:  04/17/19 141 lb 3.2 oz (64 kg)  03/25/19 139 lb 12.8 oz (63.4 kg)  03/12/19 122 lb 8 oz (55.6 kg)    Physical Exam  Results for orders placed or performed during the hospital encounter of 03/09/19  SARS CORONAVIRUS 2 (TAT 6-24 HRS) Nasopharyngeal Nasopharyngeal Swab   Specimen: Nasopharyngeal Swab  Result Value Ref Range   SARS Coronavirus 2 NEGATIVE NEGATIVE  CBC  Result Value Ref Range   WBC 24.4 (H) 4.0 - 10.5 K/uL   RBC 5.38 4.22 - 5.81 MIL/uL   Hemoglobin 15.3 13.0 - 17.0 g/dL   HCT 44.0 39.0 - 52.0 %   MCV 81.8 80.0 - 100.0 fL   MCH 28.4 26.0 - 34.0 pg   MCHC 34.8 30.0 - 36.0 g/dL   RDW 14.1 11.5 - 15.5 %   Platelets 684 (H) 150 - 400 K/uL   nRBC 0.0 0.0 - 0.2 %  CMP  Result Value Ref Range   Sodium 131 (L) 135 - 145 mmol/L   Potassium 3.4 (L) 3.5 - 5.1 mmol/L  Chloride 75 (L) 98 - 111 mmol/L   CO2 30 22 - 32 mmol/L   Glucose, Bld 119 (H) 70 - 99 mg/dL   [name] 63 (H) 8 - 23 mg/dL   Creatinine, Ser 6.34 (H) 0.61 - 1.24 mg/dL   Calcium 10.4 (H) 8.9 - 10.3 mg/dL   Total Protein 10.9 (H) 6.5 - 8.1 g/dL   Albumin 5.3 (H) 3.5 - 5.0 g/dL   AST 19 15 - 41 U/L   ALT 12 0 - 44 U/L   Alkaline Phosphatase 101 38 - 126 U/L   Total Bilirubin 0.7 0.3 - 1.2 mg/dL   GFR calc non Af Amer 8 (L) >60 mL/min   GFR calc Af Amer 10 (L) >60 mL/min   Anion gap 26 (H) 5 - 15  Lipase, blood  Result Value Ref Range   Lipase 386 (H) 11 - 51 U/L  Lactic acid, plasma  Result Value Ref Range   Lactic Acid, Venous 2.2 (HH) 0.5  - 1.9 mmol/L  C-reactive protein  Result Value Ref Range   CRP 2.2 (H) <1.0 mg/dL  Procalcitonin - Baseline  Result Value Ref Range   Procalcitonin 0.56 ng/mL  Procalcitonin  Result Value Ref Range   Procalcitonin 0.38 ng/mL  CBC  Result Value Ref Range   WBC 25.5 (H) 4.0 - 10.5 K/uL   RBC 4.34 4.22 - 5.81 MIL/uL   Hemoglobin 12.5 (L) 13.0 - 17.0 g/dL   HCT 35.8 (L) 39.0 - 52.0 %   MCV 82.5 80.0 - 100.0 fL   MCH 28.8 26.0 - 34.0 pg   MCHC 34.9 30.0 - 36.0 g/dL   RDW 14.1 11.5 - 15.5 %   Platelets 545 (H) 150 - 400 K/uL   nRBC 0.0 0.0 - 0.2 %  Creatinine, serum  Result Value Ref Range   Creatinine, Ser 5.73 (H) 0.61 - 1.24 mg/dL   GFR calc non Af Amer 9 (L) >60 mL/min   GFR calc Af Amer 11 (L) >60 mL/min  TSH  Result Value Ref Range   TSH 6.872 (H) 0.350 - 4.500 uIU/mL  Hemoglobin A1c  Result Value Ref Range   Hgb A1c MFr Bld 6.4 (H) 4.8 - 5.6 %   Mean Plasma Glucose 136.98 mg/dL  Urinalysis, Routine w reflex microscopic  Result Value Ref Range   Color, Urine YELLOW (A) YELLOW   APPearance CLOUDY (A) CLEAR   Specific Gravity, Urine 1.016 1.005 - 1.030   pH 5.0 5.0 - 8.0   Glucose, UA NEGATIVE NEGATIVE mg/dL   Hgb urine dipstick SMALL (A) NEGATIVE   Bilirubin Urine NEGATIVE NEGATIVE   Ketones, ur NEGATIVE NEGATIVE mg/dL   Protein, ur 30 (A) NEGATIVE mg/dL   Nitrite NEGATIVE NEGATIVE   Leukocytes,Ua NEGATIVE NEGATIVE   RBC / HPF 0-5 0 - 5 RBC/hpf   WBC, UA 6-10 0 - 5 WBC/hpf   Bacteria, UA RARE (A) NONE SEEN   Squamous Epithelial / LPF 0-5 0 - 5   Mucus PRESENT    Hyaline Casts, UA PRESENT    Granular Casts, UA PRESENT   Comprehensive metabolic panel  Result Value Ref Range   Sodium 131 (L) 135 - 145 mmol/L   Potassium 3.3 (L) 3.5 - 5.1 mmol/L   Chloride 93 (L) 98 - 111 mmol/L   CO2 24 22 - 32 mmol/L   Glucose, Bld 85 70 - 99 mg/dL   [name] 61 (H) 8 - 23 mg/dL   Creatinine, Ser 3.92 (H) 0.61 -  1.24 mg/dL   Calcium 7.9 (L) 8.9 - 10.3 mg/dL   Total Protein 6.8  6.5 - 8.1 g/dL   Albumin 3.3 (L) 3.5 - 5.0 g/dL   AST 13 (L) 15 - 41 U/L   ALT 10 0 - 44 U/L   Alkaline Phosphatase 66 38 - 126 U/L   Total Bilirubin 0.6 0.3 - 1.2 mg/dL   GFR calc non Af Amer 15 (L) >60 mL/min   GFR calc Af Amer 17 (L) >60 mL/min   Anion gap 14 5 - 15  Blood gas, arterial  Result Value Ref Range   FIO2 0.21    pH, Arterial 7.46 (H) 7.350 - 7.450   pCO2 arterial 40 32.0 - 48.0 mmHg   pO2, Arterial 77 (L) 83.0 - 108.0 mmHg   Bicarbonate 28.4 (H) 20.0 - 28.0 mmol/L   Acid-Base Excess 4.2 (H) 0.0 - 2.0 mmol/L   O2 Saturation 96.0 %   Patient temperature 37.0    Collection site LEFT RADIAL    Sample type ARTERIAL DRAW    Allens test (pass/fail) PASS PASS  Sodium, urine, random  Result Value Ref Range   Sodium, Ur 44 mmol/L  Creatinine, urine, random  Result Value Ref Range   Creatinine, Urine 227 mg/dL  Glucose, capillary  Result Value Ref Range   Glucose-Capillary 79 70 - 99 mg/dL   Comment 1 Notify RN    Comment 2 Document in Chart   Lipase, blood  Result Value Ref Range   Lipase 152 (H) 11 - 51 U/L  Glucose, capillary  Result Value Ref Range   Glucose-Capillary 78 70 - 99 mg/dL   Comment 1 Notify RN    Comment 2 Document in Chart   CBC with Differential/Platelet  Result Value Ref Range   WBC 18.2 (H) 4.0 - 10.5 K/uL   RBC 3.92 (L) 4.22 - 5.81 MIL/uL   Hemoglobin 11.3 (L) 13.0 - 17.0 g/dL   HCT 32.2 (L) 39.0 - 52.0 %   MCV 82.1 80.0 - 100.0 fL   MCH 28.8 26.0 - 34.0 pg   MCHC 35.1 30.0 - 36.0 g/dL   RDW 14.8 11.5 - 15.5 %   Platelets 495 (H) 150 - 400 K/uL   nRBC 0.0 0.0 - 0.2 %   Neutrophils Relative % 77 %   Neutro Abs 14.0 (H) 1.7 - 7.7 K/uL   Lymphocytes Relative 13 %   Lymphs Abs 2.3 0.7 - 4.0 K/uL   Monocytes Relative 8 %   Monocytes Absolute 1.4 (H) 0.1 - 1.0 K/uL   Eosinophils Relative 1 %   Eosinophils Absolute 0.1 0.0 - 0.5 K/uL   Basophils Relative 0 %   Basophils Absolute 0.1 0.0 - 0.1 K/uL   Immature Granulocytes 1 %   Abs  Immature Granulocytes 0.26 (H) 0.00 - 0.07 K/uL  Glucose, capillary  Result Value Ref Range   Glucose-Capillary 80 70 - 99 mg/dL   Comment 1 Notify RN    Comment 2 Document in Chart   Procalcitonin  Result Value Ref Range   Procalcitonin 0.14 ng/mL  CBC with Differential/Platelet  Result Value Ref Range   WBC 9.0 4.0 - 10.5 K/uL   RBC 3.58 (L) 4.22 - 5.81 MIL/uL   Hemoglobin 10.4 (L) 13.0 - 17.0 g/dL   HCT 29.6 (L) 39.0 - 52.0 %   MCV 82.7 80.0 - 100.0 fL   MCH 29.1 26.0 - 34.0 pg   MCHC 35.1 30.0 - 36.0 g/dL  RDW 13.9 11.5 - 15.5 %   Platelets 465 (H) 150 - 400 K/uL   nRBC 0.0 0.0 - 0.2 %   Neutrophils Relative % 63 %   Neutro Abs 5.8 1.7 - 7.7 K/uL   Lymphocytes Relative 24 %   Lymphs Abs 2.1 0.7 - 4.0 K/uL   Monocytes Relative 8 %   Monocytes Absolute 0.7 0.1 - 1.0 K/uL   Eosinophils Relative 2 %   Eosinophils Absolute 0.2 0.0 - 0.5 K/uL   Basophils Relative 1 %   Basophils Absolute 0.1 0.0 - 0.1 K/uL   Immature Granulocytes 2 %   Abs Immature Granulocytes 0.16 (H) 0.00 - 0.07 K/uL  Basic metabolic panel  Result Value Ref Range   Sodium 131 (L) 135 - 145 mmol/L   Potassium 3.0 (L) 3.5 - 5.1 mmol/L   Chloride 96 (L) 98 - 111 mmol/L   CO2 24 22 - 32 mmol/L   Glucose, Bld 105 (H) 70 - 99 mg/dL   [name] 36 (H) 8 - 23 mg/dL   Creatinine, Ser 1.53 (H) 0.61 - 1.24 mg/dL   Calcium 7.9 (L) 8.9 - 10.3 mg/dL   GFR calc non Af Amer 46 (L) >60 mL/min   GFR calc Af Amer 53 (L) >60 mL/min   Anion gap 11 5 - 15  Glucose, capillary  Result Value Ref Range   Glucose-Capillary 66 (L) 70 - 99 mg/dL  Glucose, capillary  Result Value Ref Range   Glucose-Capillary 98 70 - 99 mg/dL  Magnesium  Result Value Ref Range   Magnesium 2.1 1.7 - 2.4 mg/dL  Glucose, capillary  Result Value Ref Range   Glucose-Capillary 80 70 - 99 mg/dL  Glucose, capillary  Result Value Ref Range   Glucose-Capillary 80 70 - 99 mg/dL  Basic metabolic panel  Result Value Ref Range   Sodium 131 (L) 135 -  145 mmol/L   Potassium 4.4 3.5 - 5.1 mmol/L   Chloride 103 98 - 111 mmol/L   CO2 20 (L) 22 - 32 mmol/L   Glucose, Bld 82 70 - 99 mg/dL   [name] 16 8 - 23 mg/dL   Creatinine, Ser 1.12 0.61 - 1.24 mg/dL   Calcium 7.9 (L) 8.9 - 10.3 mg/dL   GFR calc non Af Amer >60 >60 mL/min   GFR calc Af Amer >60 >60 mL/min   Anion gap 8 5 - 15  Glucose, capillary  Result Value Ref Range   Glucose-Capillary 83 70 - 99 mg/dL  Glucose, capillary  Result Value Ref Range   Glucose-Capillary 83 70 - 99 mg/dL  Glucose, capillary  Result Value Ref Range   Glucose-Capillary 98 70 - 99 mg/dL      Assessment & Plan:   Problem List Items Addressed This Visit   None    Follow up plan: No follow-ups on file.

## 2022-01-28 ENCOUNTER — Ambulatory Visit: Payer: Medicare Other | Admitting: Nurse Practitioner

## 2022-01-28 DIAGNOSIS — I10 Essential (primary) hypertension: Secondary | ICD-10-CM

## 2022-01-28 DIAGNOSIS — Z7689 Persons encountering health services in other specified circumstances: Secondary | ICD-10-CM

## 2022-01-28 DIAGNOSIS — N1832 Chronic kidney disease, stage 3b: Secondary | ICD-10-CM

## 2022-01-28 DIAGNOSIS — I480 Paroxysmal atrial fibrillation: Secondary | ICD-10-CM

## 2022-04-20 ENCOUNTER — Other Ambulatory Visit: Payer: Self-pay

## 2022-04-20 ENCOUNTER — Emergency Department: Payer: 59

## 2022-04-20 ENCOUNTER — Encounter: Payer: Self-pay | Admitting: Emergency Medicine

## 2022-04-20 ENCOUNTER — Inpatient Hospital Stay
Admission: EM | Admit: 2022-04-20 | Discharge: 2022-04-22 | DRG: 305 | Disposition: A | Discharge status: Home / Self Care | Payer: 59 | Attending: Internal Medicine | Admitting: Internal Medicine

## 2022-04-20 DIAGNOSIS — K279 Peptic ulcer, site unspecified, unspecified as acute or chronic, without hemorrhage or perforation: Secondary | ICD-10-CM | POA: Diagnosis not present

## 2022-04-20 DIAGNOSIS — Z841 Family history of disorders of kidney and ureter: Secondary | ICD-10-CM

## 2022-04-20 DIAGNOSIS — Z8249 Family history of ischemic heart disease and other diseases of the circulatory system: Secondary | ICD-10-CM

## 2022-04-20 DIAGNOSIS — I161 Hypertensive emergency: Secondary | ICD-10-CM | POA: Diagnosis not present

## 2022-04-20 DIAGNOSIS — I480 Paroxysmal atrial fibrillation: Secondary | ICD-10-CM

## 2022-04-20 DIAGNOSIS — I10 Essential (primary) hypertension: Secondary | ICD-10-CM

## 2022-04-20 DIAGNOSIS — F17200 Nicotine dependence, unspecified, uncomplicated: Secondary | ICD-10-CM

## 2022-04-20 DIAGNOSIS — D72829 Elevated white blood cell count, unspecified: Secondary | ICD-10-CM

## 2022-04-20 DIAGNOSIS — I160 Hypertensive urgency: Secondary | ICD-10-CM | POA: Diagnosis not present

## 2022-04-20 DIAGNOSIS — F17210 Nicotine dependence, cigarettes, uncomplicated: Secondary | ICD-10-CM | POA: Diagnosis present

## 2022-04-20 DIAGNOSIS — Z91148 Patient's other noncompliance with medication regimen for other reason: Secondary | ICD-10-CM

## 2022-04-20 DIAGNOSIS — I5A Non-ischemic myocardial injury (non-traumatic): Secondary | ICD-10-CM | POA: Diagnosis present

## 2022-04-20 DIAGNOSIS — I129 Hypertensive chronic kidney disease with stage 1 through stage 4 chronic kidney disease, or unspecified chronic kidney disease: Secondary | ICD-10-CM | POA: Diagnosis present

## 2022-04-20 DIAGNOSIS — N1831 Chronic kidney disease, stage 3a: Secondary | ICD-10-CM | POA: Diagnosis present

## 2022-04-20 DIAGNOSIS — I2489 Other forms of acute ischemic heart disease: Secondary | ICD-10-CM | POA: Diagnosis present

## 2022-04-20 DIAGNOSIS — Z79899 Other long term (current) drug therapy: Secondary | ICD-10-CM

## 2022-04-20 DIAGNOSIS — F1410 Cocaine abuse, uncomplicated: Secondary | ICD-10-CM

## 2022-04-20 DIAGNOSIS — H539 Unspecified visual disturbance: Secondary | ICD-10-CM

## 2022-04-20 DIAGNOSIS — K219 Gastro-esophageal reflux disease without esophagitis: Secondary | ICD-10-CM | POA: Diagnosis present

## 2022-04-20 HISTORY — DX: Hypertensive emergency: I16.1

## 2022-04-20 HISTORY — DX: Elevated white blood cell count, unspecified: D72.829

## 2022-04-20 LAB — CBC WITH DIFFERENTIAL/PLATELET
Abs Immature Granulocytes: 0.08 10*3/uL — ABNORMAL HIGH (ref 0.00–0.07)
Basophils Absolute: 0.1 10*3/uL (ref 0.0–0.1)
Basophils Relative: 1 %
Eosinophils Absolute: 0.2 10*3/uL (ref 0.0–0.5)
Eosinophils Relative: 2 %
HCT: 43.9 % (ref 39.0–52.0)
Hemoglobin: 14.8 g/dL (ref 13.0–17.0)
Immature Granulocytes: 1 %
Lymphocytes Relative: 28 %
Lymphs Abs: 3.2 10*3/uL (ref 0.7–4.0)
MCH: 30.6 pg (ref 26.0–34.0)
MCHC: 33.7 g/dL (ref 30.0–36.0)
MCV: 90.7 fL (ref 80.0–100.0)
Monocytes Absolute: 1.1 10*3/uL — ABNORMAL HIGH (ref 0.1–1.0)
Monocytes Relative: 9 %
Neutro Abs: 6.8 10*3/uL (ref 1.7–7.7)
Neutrophils Relative %: 59 %
Platelets: 262 10*3/uL (ref 150–400)
RBC: 4.84 MIL/uL (ref 4.22–5.81)
RDW: 14.3 % (ref 11.5–15.5)
WBC: 11.3 10*3/uL — ABNORMAL HIGH (ref 4.0–10.5)
nRBC: 0 % (ref 0.0–0.2)

## 2022-04-20 LAB — COMPREHENSIVE METABOLIC PANEL
ALT: 14 U/L (ref 0–44)
AST: 27 U/L (ref 15–41)
Albumin: 4.5 g/dL (ref 3.5–5.0)
Alkaline Phosphatase: 98 U/L (ref 38–126)
Anion gap: 9 (ref 5–15)
BUN: 21 mg/dL (ref 8–23)
CO2: 21 mmol/L — ABNORMAL LOW (ref 22–32)
Calcium: 8.9 mg/dL (ref 8.9–10.3)
Chloride: 108 mmol/L (ref 98–111)
Creatinine, Ser: 1.41 mg/dL — ABNORMAL HIGH (ref 0.61–1.24)
GFR, Estimated: 53 mL/min — ABNORMAL LOW (ref >60)
Glucose, Bld: 93 mg/dL (ref 70–99)
Potassium: 3.9 mmol/L (ref 3.5–5.1)
Sodium: 138 mmol/L (ref 135–145)
Total Bilirubin: 0.6 mg/dL (ref 0.3–1.2)
Total Protein: 8.4 g/dL — ABNORMAL HIGH (ref 6.5–8.1)

## 2022-04-20 LAB — BRAIN NATRIURETIC PEPTIDE: B Natriuretic Peptide: 131.5 pg/mL — ABNORMAL HIGH (ref 0.0–100.0)

## 2022-04-20 LAB — TROPONIN I (HIGH SENSITIVITY)
Troponin I (High Sensitivity): 68 ng/L — ABNORMAL HIGH (ref <18)
Troponin I (High Sensitivity): 75 ng/L — ABNORMAL HIGH (ref <18)

## 2022-04-20 MED ORDER — ONDANSETRON HCL 4 MG/2ML IJ SOLN: 4.0000 mg | Freq: Three times a day (TID) | INTRAMUSCULAR | Status: DC | PRN

## 2022-04-20 MED ORDER — ACETAMINOPHEN 325 MG PO TABS: 650.0000 mg | ORAL_TABLET | Freq: Four times a day (QID) | ORAL | Status: DC | PRN

## 2022-04-20 MED ORDER — DM-GUAIFENESIN ER 30-600 MG PO TB12: 1.0000 | ORAL_TABLET | Freq: Two times a day (BID) | ORAL | Status: DC | PRN

## 2022-04-20 MED ORDER — HYDRALAZINE HCL 20 MG/ML IJ SOLN
10.0000 mg | INTRAMUSCULAR | Status: DC | PRN
  Administered 2022-04-21: 10 mg via INTRAVENOUS
  Filled 2022-04-20: qty 1

## 2022-04-20 MED ORDER — ASPIRIN 81 MG PO TBEC: 81.0000 mg | DELAYED_RELEASE_TABLET | Freq: Every day | ORAL | Status: DC

## 2022-04-20 MED ORDER — PANTOPRAZOLE SODIUM 40 MG PO TBEC
40.0000 mg | DELAYED_RELEASE_TABLET | Freq: Every day | ORAL | Status: DC
  Administered 2022-04-20 – 2022-04-22 (×3): 40 mg via ORAL
  Filled 2022-04-20 (×3): qty 1

## 2022-04-20 MED ORDER — NICOTINE 21 MG/24HR TD PT24
21.0000 mg | MEDICATED_PATCH | Freq: Every day | TRANSDERMAL | Status: DC
  Filled 2022-04-20 (×3): qty 1

## 2022-04-20 MED ORDER — AMLODIPINE BESYLATE 10 MG PO TABS
10.0000 mg | ORAL_TABLET | Freq: Every day | ORAL | Status: DC
  Administered 2022-04-21 – 2022-04-22 (×2): 10 mg via ORAL
  Filled 2022-04-20: qty 2
  Filled 2022-04-20: qty 1

## 2022-04-20 MED ORDER — NICARDIPINE HCL IN NACL 20-0.86 MG/200ML-% IV SOLN
3.0000 mg/h | INTRAVENOUS | Status: DC
  Administered 2022-04-20: 5 mg/h via INTRAVENOUS
  Filled 2022-04-20 (×2): qty 200

## 2022-04-20 MED ORDER — ALBUTEROL SULFATE (2.5 MG/3ML) 0.083% IN NEBU: 3.0000 mL | INHALATION_SOLUTION | RESPIRATORY_TRACT | Status: DC | PRN

## 2022-04-20 NOTE — ED Notes (Signed)
Keith Mackie, RN notified that pt is back from MRI

## 2022-04-20 NOTE — ED Notes (Signed)
Report received from Tracy,RN

## 2022-04-20 NOTE — ED Notes (Signed)
Cardene stopped per Dr. Ellender Hose.

## 2022-04-20 NOTE — ED Triage Notes (Signed)
Patient to ED via POV for visual changes. Patient states his vision has been getting worse since thanksgiving. Sent by Optometrist- for concerns of "malignant hypertension BP in office 202/129." Patient states he has been out of BP meds for a year. Pt states he has a headache 8/10.

## 2022-04-20 NOTE — ED Notes (Signed)
Spoke with sister, Ms. Cheron Schaumann, w/ pt's permission, gave update

## 2022-04-20 NOTE — ED Provider Triage Note (Signed)
Emergency Medicine Provider Triage Evaluation Note  Keith [name] , a 72 y.o. male  was evaluated in triage.  Pt here by optometrist for evaluation of his blood pressure.  Patient states he has not taken blood pressure medication and a year.  He reports that he cannot get an appointment with his PCP and therefore has not had a prescription for his blood pressure medication.  Patient also complains of a headache.  He reports vision changes that have gotten worse since Thanksgiving.  Review of Systems  Positive: Headache, vision changes. Negative:   Physical Exam  BP (!) 214/134 (BP Location: Left Arm)   Pulse 70   Temp 97.8 F (36.6 C) (Oral)   Resp 18   SpO2 97%  Gen:   Awake, no distress alert, talkative. Resp:  Normal effort lungs clear bilaterally. MSK:   Moves extremities without difficulty  Other:    Medical Decision Making  Medically screening exam initiated at 10:23 AM.  Appropriate orders placed.  YACOUB DILTZ was informed that the remainder of the evaluation will be completed by another provider, this initial triage assessment does not replace that evaluation, and the importance of remaining in the ED until their evaluation is complete.     Keith Hai, PA-C 04/20/22 1023

## 2022-04-20 NOTE — ED Notes (Signed)
Mri screening done by mri, ok for pt to eat, given lunch

## 2022-04-20 NOTE — H&P (Signed)
History and Physical    Keith [name]:[id number] DOB: 01-12-1951 DOA: 04/20/2022  Referring MD/NP/PA:   PCP: Pcp, No   Patient coming from:  The patient is coming from home.    Chief Complaint: vision change  HPI: Keith [name] is a 72 y.o. male with medical history significant of hypertension, peptic ulcer disease, tobacco abuse, atrial fibrillation, GERD, CKD-3, medication noncompliance, who presents with vision change.  Patient states that over the last 2 weeks he has had changes in his vision. Pt was seen by ophthalmologist.  He states that he had a procedure on his right eye where "blood was taken out" and gas was injected, and has been having some spotting in his left eye.  He went to his optometrist today who sent him to ED due to concern for hypertensive emergency. His blood pressure is 216/142 in ED. He has aching, throbbing headache as well.  No chest pain or shortness of breath, or swelling. No nausea, vomiting, diarrhea or abdominal pain.  No symptoms of UTI. Of note, patient states that he has been off of his blood pressure medications for a year because he had issues getting it with his PCP.     Data reviewed independently and ED Course: pt was found to have troponin level 68, WBC 11.3, renal function at baseline, temperature normal, blood pressure 216/142, 192/103, heart rate 58, RR 21, oxygen saturation 94% on room air.  Patient is placed on PCU for observation.  CT of head:  1. Gas within the right globe superior to the lens. This appears to be contained within the anterior chamber. Question recent procedure. I discussed this with Letitia Neri and she was unaware of any recent procedure. Question trauma. 2. No acute intracranial abnormality. 3. Scattered areas of subcortical white matter hypoattenuation are somewhat advanced for age. This likely reflects the sequela of chronic microvascular ischemia. 4. Remote ischemic changes in the lateral thalami  bilaterally. 5. Remote lacunar infarcts in the lentiform nuclei bilaterally.  MRI-brain: 1. No evidence of acute intracranial abnormality. 2. Advanced chronic small vessel ischemic changes within the supratentorial and infratentorial brain, with multiple chronic infarcts. 3. Numerous chronic parenchymal microhemorrhages with a deep gray nuclei and posterior fossa predominance. The distribution suggests sequelae of chronic hypertensive microangiopathy 4. Redemonstrated gas within the anterior aspect of the right globe, possibly related to a recent procedure or recent trauma. Clinical correlation is recommended.    EKG: I have personally reviewed.  sinus rhythm, QTc 437, PAC, LVH, T wave inversion in inferior leads and V4-V6   Review of Systems:   General: no fevers, chills, no body weight gain, has fatigue and HA HEENT: no blurry vision, hearing changes or sore throat Respiratory: no dyspnea, coughing, wheezing CV: no chest pain, no palpitations GI: no nausea, vomiting, abdominal pain, diarrhea, constipation GU: no dysuria, burning on urination, increased urinary frequency, hematuria  Ext: no leg edema Neuro: no unilateral weakness, numbness, or tingling, no hearing loss. Has vision change Skin: no rash, no skin tear. MSK: No muscle spasm, no deformity, no limitation of range of movement in spin Heme: No easy bruising.  Travel history: No recent long distant travel.   Allergy: No Known Allergies  Past Medical History:  Diagnosis Date   CKD (chronic kidney disease), stage II    Cocaine abuse ([place])    Duodenal ulcer    Gastric ulcer    Hypertension    Noncompliance    Paroxysmal atrial fibrillation ([place])  Tobacco abuse     Past Surgical History:  Procedure Laterality Date   ESOPHAGOGASTRODUODENOSCOPY (EGD) WITH PROPOFOL N/A 02/26/2019   Procedure: ESOPHAGOGASTRODUODENOSCOPY (EGD) WITH PROPOFOL;  Surgeon: Lucilla Lame, MD;  Location: [facility] ENDOSCOPY;  Service:  Endoscopy;  Laterality: N/A;   GASTROJEJUNOSTOMY N/A 02/27/2019   Procedure: GASTROJEJUNOSTOMY;  Surgeon: Jules Husbands, MD;  Location: [facility] ORS;  Service: General;  Laterality: N/A;    Social History:  reports that he has been smoking cigarettes. He has been smoking an average of .25 packs per day. He has never used smokeless tobacco. He reports that he does not currently use alcohol. He reports current drug use. Drug: "Crack" cocaine.  Family History:  Family History  Problem Relation Age of Onset   Diabetes Mother    Hypertension Mother    CAD Father    Diabetes Sister    Hypertension Sister    Kidney failure Sister      Prior to Admission medications   Medication Sig Start Date End Date Taking? Authorizing Provider  amLODipine (NORVASC) 10 MG tablet  03/22/19   [provider]  Fluticasone-Salmeterol (ADVAIR) 500-50 MCG/DOSE AEPB Inhale into the lungs. 03/22/19 03/21/20  [provider]  lisinopril (ZESTRIL) 10 MG tablet Take by mouth. 03/22/19 03/21/20  [provider]  omeprazole (PRILOSEC) 20 MG capsule Take 1 capsule (20 mg total) by mouth 2 (two) times daily before a meal. 03/28/19   Pabon, [place], MD  Omeprazole 20 MG TBEC Take 1 tablet (20 mg total) by mouth 2 (two) times daily for 10 days. 03/25/19 04/04/19  Jules Husbands, MD    Physical Exam: Vitals:   04/20/22 1440 04/20/22 1445 04/20/22 1450 04/20/22 1610  BP: (!) 159/91 (!) 152/95 (!) 146/82 (!) 160/100  Pulse: 97 92 85 82  Resp: 17 16 17    Temp:    98.1 F (36.7 C)  TempSrc:      SpO2: 93% 92% 96% 97%   General: Not in acute distress HEENT:       Eyes: PERRL, EOMI, no scleral icterus.       ENT: No discharge from the ears and nose, no pharynx injection, no tonsillar enlargement.        Neck: No JVD, no bruit, no mass felt. Heme: No neck lymph node enlargement. Cardiac: S1/S2, RRR, No murmurs, No gallops or rubs. Respiratory: No rales, wheezing, rhonchi or rubs. GI: Soft,  nondistended, nontender, no rebound pain, no organomegaly, BS present. GU: No hematuria Ext: No pitting leg edema bilaterally. 1+DP/PT pulse bilaterally. Musculoskeletal: No joint deformities, No joint redness or warmth, no limitation of ROM in spin. Skin: No rashes.  Neuro: Alert, oriented X3, cranial nerves II-XII grossly intact, moves all extremities normally.  Psych: Patient is not psychotic, no suicidal or hemocidal ideation.  Labs on Admission: I have personally reviewed following labs and imaging studies  CBC: Recent Labs  Lab 04/20/22 1014  WBC 11.3*  NEUTROABS 6.8  HGB 14.8  HCT 43.9  MCV 90.7  PLT 161   Basic Metabolic Panel: Recent Labs  Lab 04/20/22 1014  NA 138  K 3.9  CL 108  CO2 21*  GLUCOSE 93  BUN 21  CREATININE 1.41*  CALCIUM 8.9   GFR: CrCl cannot be calculated (Unknown ideal weight.). Liver Function Tests: Recent Labs  Lab 04/20/22 1014  AST 27  ALT 14  ALKPHOS 98  BILITOT 0.6  PROT 8.4*  ALBUMIN 4.5   No results for input(s): "LIPASE", "AMYLASE" in  the last 168 hours. No results for input(s): "AMMONIA" in the last 168 hours. Coagulation Profile: No results for input(s): "INR", "PROTIME" in the last 168 hours. Cardiac Enzymes: No results for input(s): "CKTOTAL", "CKMB", "CKMBINDEX", "TROPONINI" in the last 168 hours. BNP (last 3 results) No results for input(s): "PROBNP" in the last 8760 hours. HbA1C: No results for input(s): "HGBA1C" in the last 72 hours. CBG: No results for input(s): "GLUCAP" in the last 168 hours. Lipid Profile: No results for input(s): "CHOL", "HDL", "LDLCALC", "TRIG", "CHOLHDL", "LDLDIRECT" in the last 72 hours. Thyroid Function Tests: No results for input(s): "TSH", "T4TOTAL", "FREET4", "T3FREE", "THYROIDAB" in the last 72 hours. Anemia Panel: No results for input(s): "VITAMINB12", "FOLATE", "FERRITIN", "TIBC", "IRON", "RETICCTPCT" in the last 72 hours. Urine analysis:    Component Value Date/Time    COLORURINE YELLOW (A) 03/09/2019 2250   APPEARANCEUR CLOUDY (A) 03/09/2019 2250   LABSPEC 1.016 03/09/2019 2250   PHURINE 5.0 03/09/2019 2250   GLUCOSEU NEGATIVE 03/09/2019 2250   HGBUR SMALL (A) 03/09/2019 2250   BILIRUBINUR NEGATIVE 03/09/2019 2250   KETONESUR NEGATIVE 03/09/2019 2250   PROTEINUR 30 (A) 03/09/2019 2250   NITRITE NEGATIVE 03/09/2019 2250   LEUKOCYTESUR NEGATIVE 03/09/2019 2250   Sepsis Labs: @LABRCNTIP (procalcitonin:4,lacticidven:4) )No results found for this or any previous visit (from the past 240 hour(s)).   Radiological Exams on Admission: MR BRAIN WO CONTRAST  Result Date: 04/20/2022 CLINICAL DATA:  Provided history: Neuro deficit, acute, stroke suspected. Additional history provided: Headache, hypertension. EXAM: MRI HEAD WITHOUT CONTRAST TECHNIQUE: Multiplanar, multiecho pulse sequences of the brain and surrounding structures were obtained without intravenous contrast. COMPARISON:  Head CT 04/20/2022 FINDINGS: Brain: No age advanced or lobar predominant parenchymal atrophy. Advanced multifocal T2 FLAIR hyperintense signal abnormality within the cerebral white matter and pons, nonspecific but compatible with chronic small vessel ischemic disease. This includes chronic lacunar infarcts within the bilateral cerebral hemispheric white matter. Chronic small-vessel ischemic changes also present within the bilateral deep gray nuclei, including chronic lacunar infarcts. Additional chronic small vessel ischemic changes within the bilateral cerebellar hemispheres, including a small chronic left cerebellar infarct. Numerous chronic parenchymal microhemorrhages with a deep gray nuclei and posterior fossa predominance. There is no acute infarct. No evidence of an intracranial mass. No extra-axial fluid collection. No midline shift. Vascular: Maintained flow voids within the proximal large arterial vessels. Skull and upper cervical spine: No focal suspicious marrow lesion.  Sinuses/Orbits: Gas within the anterior aspect of the right globe, unchanged from the head CT performed earlier today. Minimal mucosal thickening scattered within bilateral ethmoid air cells. IMPRESSION: 1. No evidence of acute intracranial abnormality. 2. Advanced chronic small vessel ischemic changes within the supratentorial and infratentorial brain, with multiple chronic infarcts. 3. Numerous chronic parenchymal microhemorrhages with a deep gray nuclei and posterior fossa predominance. The distribution suggests sequelae of chronic hypertensive microangiopathy 4. Redemonstrated gas within the anterior aspect of the right globe, possibly related to a recent procedure or recent trauma. Clinical correlation is recommended. Electronically Signed   By: Kellie Simmering D.O.   On: 04/20/2022 14:41   CT Head Wo Contrast  Result Date: 04/20/2022 CLINICAL DATA:  Headache, posttraumatic.  Malignant hypertension. EXAM: CT HEAD WITHOUT CONTRAST TECHNIQUE: Contiguous axial images were obtained from the base of the skull through the vertex without intravenous contrast. RADIATION DOSE REDUCTION: This exam was performed according to the departmental dose-optimization program which includes automated exposure control, adjustment of the mA and/or kV according to patient size and/or use of iterative reconstruction technique.  COMPARISON:  None Available. FINDINGS: Brain: Scattered areas of subcortical white matter hypoattenuation are somewhat advanced for age. No acute infarct, hemorrhage, or mass lesion is present. Remote ischemic changes are present in the lateral thalami bilaterally. Remote lacunar infarcts are present in the lentiform nuclei bilaterally. No focal encephalomalacia or posttraumatic changes are present. The ventricles are of normal size. The brainstem and cerebellum are within normal limits. Vascular: Atherosclerotic calcifications are present within the cavernous internal carotid arteries bilaterally and at the  dural margin of the left vertebral artery. No significant asymmetric density is present. Skull: Calvarium is intact. No focal lytic or blastic lesions are present. No significant extracranial soft tissue lesion is present. Sinuses/Orbits: The paranasal sinuses and mastoid air cells are clear. Gas is present within the right globe superior to the lens. This appears to be contained within the anterior chamber. Question recent procedure. No other lesions are present in either orbit. Other: IMPRESSION: 1. Gas within the right globe superior to the lens. This appears to be contained within the anterior chamber. Question recent procedure. I discussed this with Letitia Neri and she was unaware of any recent procedure. Question trauma. 2. No acute intracranial abnormality. 3. Scattered areas of subcortical white matter hypoattenuation are somewhat advanced for age. This likely reflects the sequela of chronic microvascular ischemia. 4. Remote ischemic changes in the lateral thalami bilaterally. 5. Remote lacunar infarcts in the lentiform nuclei bilaterally. Critical Value/emergent results were called by telephone at the time of interpretation on 04/20/2022 at 10:46 am to provider [facility] , who verbally acknowledged these results. Electronically Signed   By: San Morelle M.D.   On: 04/20/2022 10:47      Assessment/Plan Principal Problem:   Hypertensive emergency Active Problems:   Essential hypertension   Vision changes   PAF (paroxysmal atrial fibrillation) (HCC)   Peptic ulcer disease   Chronic kidney disease, stage 3a (HCC)   Myocardial injury   Leukocytosis   Cocaine use disorder (HCC)   Tobacco use disorder   Assessment and Plan:  Hypertensive emergency and hx of essential hypertension: Blood pressure 216/142, 192/103, this is due to medication noncompliance.    -Placed on PCU for observation -Continue Cardene drip which is started in ED -Resume amlodipine 10 mg daily -IV  hydralazine as needed.  Patient is off Cardene drip  Vision changes: Possibly due to retinal hemorrhage.  Potential differential diagnosis is stroke. -Follow-up MRI for brain to rule out stroke --> negative for acute stroke.  But showed possible sequelae of chronic hypertensive microangiopathy  PAF (paroxysmal atrial fibrillation) [facility]): Patient is not taking anticoagulants currently.  Heart rate 58. -Telemetry monitoring  Peptic ulcer disease -Protonix  Chronic kidney disease, stage 3a [facility]): Patient had creatinine 2.50 on 11/28/2018.  No recent creatinine data available.  Today his creatinine is 1.41, BUN 21, GFR 53. -Follow-up with BMP  Myocardial injury: Troponin level 68 -Will not give aspirin since patient may have retinal hemorrhage -Trend troponin -Check A1c, FLP  Leukocytosis: WBC 11.3, no signs of infection.  No fever.  Likely reactive -Follow-up with CBC  Cocaine use disorder (HCC) and Tobacco use disorder -Did counseling about importance of quitting substance use and smoking -Check UDS -Nicotine patch      DVT ppx: SCD  Code Status: Full code  Family Communication: not done, no family member is at bed side.   Disposition Plan:  Anticipate discharge back to previous environment  Consults called:  none  Admission status and Level of  care: Progressive:    for obs    Dispo: The patient is from: Home              Anticipated d/c is to: Home              Anticipated d/c date is: 2 days              Patient currently is not medically stable to d/c.    Severity of Illness:  The appropriate patient status for this patient is OBSERVATION. Observation status is judged to be reasonable and necessary in order to provide the required intensity of service to ensure the patient's safety. The patient's presenting symptoms, physical exam findings, and initial radiographic and laboratory data in the context of their medical condition is felt to place them at decreased  risk for further clinical deterioration. Furthermore, it is anticipated that the patient will be medically stable for discharge from the hospital within 2 midnights of admission.        Date of Service 04/20/2022    [facility]   If 7PM-7AM, please contact night-coverage [email or web address] 04/20/2022, 4:58 PM

## 2022-04-20 NOTE — ED Provider Notes (Signed)
[facility] Provider Note    Event Date/Time   First MD Initiated Contact with Patient 04/20/22 1058     (approximate)   History   Visual Field Change and Hypertension   HPI  Keith [name] is a 72 y.o. male from acute visit for urgency/emergency.  Patient states that over the last 2 weeks he has had changes in his vision.  He actually had a procedure on his right eye where "blood was taken out" and gas was injected, and has been having some spotting in his left eye.  He went to his optometrist today who sent him in for concern for hypertensive emergency.  Patient states that he has been off of his blood pressure medications for a year because he had issues getting it with his PCP.  He has had aching, throbbing headache as well.  No chest pain or shortness of breath, or swelling.     Physical Exam   Triage Vital Signs: ED Triage Vitals [04/20/22 1012]  Enc Vitals Group     BP (!) 214/134     Pulse Rate 70     Resp 18     Temp 97.8 F (36.6 C)     Temp Source Oral     SpO2 97 %     Weight      Height      Head Circumference      Peak Flow      Pain Score 8     Pain Loc      Pain Edu?      Excl. in [place]?     Most recent vital signs: Vitals:   04/20/22 2053 04/20/22 2119  BP:  (!) 186/160  Pulse:  65  Resp:  20  Temp: (!) 97.5 F (36.4 C) 97.6 F (36.4 C)  SpO2:  99%     General: Awake, no distress.  CV:  Good peripheral perfusion.  Regular rate and rhythm. Resp:  Normal effort.  Abd:  No distention.  Other:  Cranial nerves II through XII intact.  Alert and oriented.  Strength 5 bilateral lower extremities.  Normal sensation to light touch.   ED Results / Procedures / Treatments   Labs (all labs ordered are listed, but only abnormal results are displayed) Labs Reviewed  CBC WITH DIFFERENTIAL/PLATELET - Abnormal; Notable for the following components:      Result Value   WBC 11.3 (*)    Monocytes Absolute 1.1 (*)    Abs  Immature Granulocytes 0.08 (*)    All other components within normal limits  COMPREHENSIVE METABOLIC PANEL - Abnormal; Notable for the following components:   CO2 21 (*)    Creatinine, Ser 1.41 (*)    Total Protein 8.4 (*)    GFR, Estimated 53 (*)    All other components within normal limits  BRAIN NATRIURETIC PEPTIDE - Abnormal; Notable for the following components:   B Natriuretic Peptide 131.5 (*)    All other components within normal limits  TROPONIN I (HIGH SENSITIVITY) - Abnormal; Notable for the following components:   Troponin I (High Sensitivity) 68 (*)    All other components within normal limits  TROPONIN I (HIGH SENSITIVITY) - Abnormal; Notable for the following components:   Troponin I (High Sensitivity) 75 (*)    All other components within normal limits  HEMOGLOBIN A1C  URINE DRUG SCREEN, QUALITATIVE ([facility] ONLY)  LIPID PANEL  BASIC METABOLIC PANEL  CBC  TROPONIN I (HIGH SENSITIVITY)  EKG Normal sinus rhythm, ventricular rate 75.  PR 186, QRS 88, QTc 437.  LVH.   RADIOLOGY CT head: Remote infarcts, gases in the right lobe superior to the lines   I also independently reviewed and agree with radiologist interpretations.   PROCEDURES:  Critical Care performed: Yes, see critical care procedure note(s)  .Critical Care  Performed by: Duffy Bruce, MD Authorized by: Duffy Bruce, MD   Critical care provider statement:    Critical care time (minutes):  30   Critical care time was exclusive of:  Separately billable procedures and treating other patients   Critical care was necessary to treat or prevent imminent or life-threatening deterioration of the following conditions:  Circulatory failure, cardiac failure and respiratory failure   Critical care was time spent personally by me on the following activities:  Development of treatment plan with patient or surrogate, discussions with consultants, evaluation of patient's response to treatment, examination  of patient, ordering and review of laboratory studies, ordering and review of radiographic studies, ordering and performing treatments and interventions, pulse oximetry, re-evaluation of patient's condition and review of old [place] ED: Medications  nicardipine (CARDENE) 20mg  in 0.86% saline 279ml IV infusion (0.1 mg/ml) (0 mg/hr Intravenous Stopped 04/20/22 1454)  albuterol (PROVENTIL) (2.5 MG/3ML) 0.083% nebulizer solution 3 mL (has no administration in time range)  dextromethorphan-guaiFENesin (MUCINEX DM) 30-600 MG per 12 hr tablet 1 tablet (has no administration in time range)  ondansetron (ZOFRAN) injection 4 mg (has no administration in time range)  acetaminophen (TYLENOL) tablet 650 mg (has no administration in time range)  hydrALAZINE (APRESOLINE) injection 10 mg (has no administration in time range)  amLODipine (NORVASC) tablet 10 mg (10 mg Oral Not Given 04/20/22 1453)  pantoprazole (PROTONIX) EC tablet 40 mg (40 mg Oral Given 04/20/22 1453)  nicotine (NICODERM CQ - dosed in mg/24 hours) patch 21 mg (21 mg Transdermal Patient Refused/Not Given 04/20/22 1455)     IMPRESSION / MDM / [place] / ED COURSE  I reviewed the triage vital signs and the nursing notes.                              Differential diagnosis includes, but is not limited to, HTN emergency, urgency, migraine HA, primary HA syndrome, CVA, intracranial bleed, intracranial mass/lesion.  Patient's presentation is most consistent with acute presentation with potential threat to life or bodily function.  The patient is on the cardiac monitor to evaluate for evidence of arrhythmia and/or significant heart rate changes.  72 yo M here with vision changes, headache. Pt has been off HTN meds for a year. Suspect HTN emergency with signs of HTN retinopathy (confirmed by Optometry) and headache. CT head neg for bleed. EKG nonischemic. Labs overall unremarkable though trop slightly elevated  likely from demand/HTN. Renal function is slightly impaired, Cr 1.41. Will send for MRI, start on cardene gtt given his visual sx.   FINAL CLINICAL IMPRESSION(S) / ED DIAGNOSES   Final diagnoses:  Hypertensive emergency     Rx / DC Orders   ED Discharge Orders     None        Note:  This document was prepared using Dragon voice recognition software and may include unintentional dictation errors.   Duffy Bruce, MD 04/20/22 2155

## 2022-04-20 NOTE — ED Notes (Signed)
Float rn, Miquel Dunn to [employer] with pt

## 2022-04-20 NOTE — ED Notes (Signed)
Pt placed on MRI pump, verified with MRI tech at 75 mL/hr or 7.5 mg/hr.

## 2022-04-21 ENCOUNTER — Encounter: Payer: Self-pay | Admitting: Internal Medicine

## 2022-04-21 ENCOUNTER — Other Ambulatory Visit: Payer: Self-pay

## 2022-04-21 DIAGNOSIS — I161 Hypertensive emergency: Secondary | ICD-10-CM | POA: Diagnosis not present

## 2022-04-21 DIAGNOSIS — N1831 Chronic kidney disease, stage 3a: Secondary | ICD-10-CM | POA: Diagnosis present

## 2022-04-21 DIAGNOSIS — F17210 Nicotine dependence, cigarettes, uncomplicated: Secondary | ICD-10-CM | POA: Diagnosis present

## 2022-04-21 DIAGNOSIS — I2489 Other forms of acute ischemic heart disease: Secondary | ICD-10-CM | POA: Diagnosis present

## 2022-04-21 DIAGNOSIS — Z91148 Patient's other noncompliance with medication regimen for other reason: Secondary | ICD-10-CM | POA: Diagnosis not present

## 2022-04-21 DIAGNOSIS — Z79899 Other long term (current) drug therapy: Secondary | ICD-10-CM | POA: Diagnosis not present

## 2022-04-21 DIAGNOSIS — Z841 Family history of disorders of kidney and ureter: Secondary | ICD-10-CM | POA: Diagnosis not present

## 2022-04-21 DIAGNOSIS — I129 Hypertensive chronic kidney disease with stage 1 through stage 4 chronic kidney disease, or unspecified chronic kidney disease: Secondary | ICD-10-CM | POA: Diagnosis present

## 2022-04-21 DIAGNOSIS — K219 Gastro-esophageal reflux disease without esophagitis: Secondary | ICD-10-CM | POA: Diagnosis present

## 2022-04-21 DIAGNOSIS — Z8249 Family history of ischemic heart disease and other diseases of the circulatory system: Secondary | ICD-10-CM | POA: Diagnosis not present

## 2022-04-21 DIAGNOSIS — F1410 Cocaine abuse, uncomplicated: Secondary | ICD-10-CM | POA: Diagnosis present

## 2022-04-21 DIAGNOSIS — I5A Non-ischemic myocardial injury (non-traumatic): Secondary | ICD-10-CM | POA: Diagnosis present

## 2022-04-21 DIAGNOSIS — I160 Hypertensive urgency: Secondary | ICD-10-CM | POA: Diagnosis present

## 2022-04-21 DIAGNOSIS — K279 Peptic ulcer, site unspecified, unspecified as acute or chronic, without hemorrhage or perforation: Secondary | ICD-10-CM | POA: Diagnosis present

## 2022-04-21 DIAGNOSIS — I480 Paroxysmal atrial fibrillation: Secondary | ICD-10-CM | POA: Diagnosis present

## 2022-04-21 LAB — CBC
HCT: 40.8 % (ref 39.0–52.0)
Hemoglobin: 14.1 g/dL (ref 13.0–17.0)
MCH: 31 pg (ref 26.0–34.0)
MCHC: 34.6 g/dL (ref 30.0–36.0)
MCV: 89.7 fL (ref 80.0–100.0)
Platelets: 250 10*3/uL (ref 150–400)
RBC: 4.55 MIL/uL (ref 4.22–5.81)
RDW: 14.5 % (ref 11.5–15.5)
WBC: 8.8 10*3/uL (ref 4.0–10.5)
nRBC: 0 % (ref 0.0–0.2)

## 2022-04-21 LAB — URINE DRUG SCREEN, QUALITATIVE (ARMC ONLY)
Amphetamines, Ur Screen: NOT DETECTED
Barbiturates, Ur Screen: NOT DETECTED
Benzodiazepine, Ur Scrn: NOT DETECTED
Cannabinoid 50 Ng, Ur ~~LOC~~: NOT DETECTED
Cocaine Metabolite,Ur ~~LOC~~: POSITIVE — AB
MDMA (Ecstasy)Ur Screen: NOT DETECTED
Methadone Scn, Ur: NOT DETECTED
Opiate, Ur Screen: NOT DETECTED
Phencyclidine (PCP) Ur S: NOT DETECTED
Tricyclic, Ur Screen: NOT DETECTED

## 2022-04-21 LAB — LIPID PANEL
Cholesterol: 152 mg/dL (ref 0–200)
HDL: 47 mg/dL (ref >40)
LDL Cholesterol: 86 mg/dL (ref 0–99)
Total CHOL/HDL Ratio: 3.2 RATIO
Triglycerides: 94 mg/dL (ref <150)
VLDL: 19 mg/dL (ref 0–40)

## 2022-04-21 LAB — BASIC METABOLIC PANEL
Anion gap: 8 (ref 5–15)
BUN: 18 mg/dL (ref 8–23)
CO2: 20 mmol/L — ABNORMAL LOW (ref 22–32)
Calcium: 8.1 mg/dL — ABNORMAL LOW (ref 8.9–10.3)
Chloride: 108 mmol/L (ref 98–111)
Creatinine, Ser: 1.23 mg/dL (ref 0.61–1.24)
GFR, Estimated: >60 mL/min (ref >60)
Glucose, Bld: 97 mg/dL (ref 70–99)
Potassium: 3.6 mmol/L (ref 3.5–5.1)
Sodium: 136 mmol/L (ref 135–145)

## 2022-04-21 LAB — HEMOGLOBIN A1C
Hgb A1c MFr Bld: 5.6 % (ref 4.8–5.6)
Mean Plasma Glucose: 114.02 mg/dL

## 2022-04-21 MED ORDER — HYDROCERIN EX CREA
1.0000 | TOPICAL_CREAM | Freq: Two times a day (BID) | CUTANEOUS | Status: DC
  Administered 2022-04-22: 1 via TOPICAL
  Filled 2022-04-21 (×2): qty 113

## 2022-04-21 MED ORDER — LISINOPRIL 20 MG PO TABS
20.0000 mg | ORAL_TABLET | Freq: Every day | ORAL | Status: DC
  Administered 2022-04-22: 20 mg via ORAL
  Filled 2022-04-21: qty 1

## 2022-04-21 MED ORDER — MOMETASONE FURO-FORMOTEROL FUM 200-5 MCG/ACT IN AERO
2.0000 | INHALATION_SPRAY | Freq: Two times a day (BID) | RESPIRATORY_TRACT | Status: DC
  Filled 2022-04-21: qty 8.8

## 2022-04-21 MED ORDER — LISINOPRIL 10 MG PO TABS
10.0000 mg | ORAL_TABLET | Freq: Every day | ORAL | Status: DC
  Administered 2022-04-21: 10 mg via ORAL
  Filled 2022-04-21: qty 1

## 2022-04-21 MED ORDER — MOXIFLOXACIN HCL 0.5 % OP SOLN
1.0000 [drp] | Freq: Three times a day (TID) | OPHTHALMIC | Status: DC
  Administered 2022-04-21 – 2022-04-22 (×2): 1 [drp] via OPHTHALMIC
  Filled 2022-04-21: qty 3
  Filled 2022-04-21: qty 0.1

## 2022-04-21 MED ORDER — PREDNISOLONE ACETATE 1 % OP SUSP
2.0000 [drp] | Freq: Four times a day (QID) | OPHTHALMIC | Status: DC
  Administered 2022-04-21 – 2022-04-22 (×5): 2 [drp] via OPHTHALMIC
  Filled 2022-04-21: qty 1
  Filled 2022-04-21: qty 5

## 2022-04-21 NOTE — ED Notes (Signed)
Pt ate entirety of meal tray. Pt denies any additional needs or concerns at this time.

## 2022-04-21 NOTE — ED Notes (Signed)
Assumed care from Arissa,RN. Pt resting comfortably in bed at this time. Pt denies any current needs or questions. Call light with in reach.   

## 2022-04-21 NOTE — Assessment & Plan Note (Signed)
Continue PPI ?

## 2022-04-21 NOTE — Assessment & Plan Note (Signed)
Presented in Hypertensive Emergency. Mgmt as outlined.

## 2022-04-21 NOTE — Assessment & Plan Note (Signed)
WBC on admission 11.3k. Normalized to 8.8k next day. No fevers or s/sx's of infection. Monitor CBC

## 2022-04-21 NOTE — Progress Notes (Signed)
Progress Note   Patient: Keith [name] DOB: 1951/02/01 DOA: 04/20/2022     0 DOS: the patient was seen and examined on 04/21/2022   Brief hospital course: HPI on admission, per Dr. Blaine Hamper: " Keith [name] is a 72 y.o. male with medical history significant of hypertension, peptic ulcer disease, tobacco abuse, atrial fibrillation, GERD, CKD-3, medication noncompliance, who presents with vision change.   Patient states that over the last 2 weeks he has had changes in his vision. Pt was seen by ophthalmologist.  He states that he had a procedure on his right eye where "blood was taken out" and gas was injected, and has been having some spotting in his left eye.  He went to his optometrist today who sent him to ED due to concern for hypertensive emergency. His blood pressure is 216/142 in ED. He has aching, throbbing headache as well.  No chest pain or shortness of breath, or swelling. No nausea, vomiting, diarrhea or abdominal pain.  No symptoms of UTI. Of note, patient states that he has been off of his blood pressure medications for a year because he had issues getting it with his PCP. "     ED Course: troponin level 68, WBC 11.3, renal function at baseline, temperature normal, blood pressure 216/142, 192/103, heart rate 58, RR 21, oxygen saturation 94% on room air.  Patient is placed on PCU for observation."   1/11 - patient's BP remains uncontrolled.  Amlodipine and lisinopril resumed.  Off Cardene drip but needing IV hydralazine pushes.  Assessment and Plan: * Hypertensive emergency Initially on Cardene drip. Resumed amlodipine and lisinopril. IV hydralazine PRN.   Essential hypertension Presented in Hypertensive Emergency. Mgmt as outlined.   Vision changes Recent procedure with retinal hemorrhage. Sent to ED by ophthalmology. MRI brain negative for stroke, shows old infarcts and chronic hypertensive microangiopathy. --Continue eye drops. --Follow up as  scheduled.  PAF (paroxysmal atrial fibrillation) (HCC) Rate controlled.  Telemetry monitoring. Appears not to be on anticoagulation or rate control agents.  Peptic ulcer disease Continue PPI  Chronic kidney disease, stage 3a ([place]) Patient had creatinine 2.50 on 11/28/2018.  No recent creatinine data available.  On admission, creatinine 1.41. Monitor BMP.  Myocardial injury Due to demand ischemia in setting of hypertensive emergency. No chest pain or ischemic EKC changes  Leukocytosis WBC on admission 11.3k. Normalized to 8.8k next day. No fevers or s/sx's of infection. Monitor CBC  Cocaine use disorder ([place]) Counseled about importance of quitting substance use and smoking, especially with severely elevated BP. --UDS positive for cocaine --Nicotine patch ordered  Tobacco use disorder Nicotine patch ordered        Subjective: Pt seen eating breakfast in ED, holding for a bed.  He denies any acute complaints, says he feels well.  Asks to have eye drops ordered, and lotion for his very dry skin.   Physical Exam: Vitals:   04/21/22 1100 04/21/22 1200 04/21/22 1216 04/21/22 1300  BP: (!) 160/97 (!) 177/112  (!) 159/111  Pulse: 66 67 74 64  Resp: 17 17 18 16   Temp:   97.9 F (36.6 C)   TempSrc:   Oral   SpO2: 96% 96% 96% 96%  Weight:      Height:       General exam: awake, alert, no acute distress HEENT: atraumatic, clear conjunctiva, anicteric sclera, moist mucus membranes, hearing grossly normal  Respiratory system: CTAB, no wheezes, rales or rhonchi, normal respiratory effort. Cardiovascular system: normal S1/S2,  RRR, no JVD, murmurs, rubs, gallops,  no pedal edema.   Gastrointestinal system: soft, NT, ND, no HSM felt, +bowel sounds. Central nervous system: A&O x3. no gross focal neurologic deficits, normal speech Extremities: moves all, no edema, normal tone Skin: dry flaky skin noted  Psychiatry: normal mood, congruent affect   Data Reviewed:  Notable labs  --- WBC normalized.  Lipid profile within normal limits.  Bicarb 20     Family Communication: none present, will attempt to call  Disposition:  Status is: Inpatient Remains inpatient appropriate because: BP remains uncontrolled, requires medication changes and further improvement. Expect d/c tomorrow if BP improved.      Planned Discharge Destination: Home    Time spent: 45 minutes  Author: Ezekiel Slocumb, DO 04/21/2022 2:01 PM  For on call review [email or web address].

## 2022-04-21 NOTE — Assessment & Plan Note (Addendum)
Patient had creatinine 2.50 on 11/28/2018.  No recent creatinine data available.  On admission, creatinine 1.41. Cr today 1.16. Monitor BMP at follow up.

## 2022-04-21 NOTE — Assessment & Plan Note (Signed)
Rate controlled.  Telemetry monitoring. Appears not to be on anticoagulation or rate control agents.

## 2022-04-21 NOTE — ED Notes (Signed)
Keith [name] (daughter) [phone]

## 2022-04-21 NOTE — Assessment & Plan Note (Addendum)
Due to demand ischemia in setting of hypertensive emergency. No chest pain or ischemic EKC changes

## 2022-04-21 NOTE — Assessment & Plan Note (Addendum)
Recent procedure with retinal hemorrhage. Sent to ED by ophthalmology. MRI brain negative for stroke, shows old infarcts and chronic hypertensive microangiopathy. --Continue eye drops. --Follow up as scheduled.

## 2022-04-21 NOTE — Assessment & Plan Note (Addendum)
Counseled about importance of quitting substance use and smoking, especially with severely elevated BP. --UDS positive for cocaine --Nicotine patch ordered

## 2022-04-21 NOTE — Assessment & Plan Note (Signed)
Initially on Cardene drip. Resumed amlodipine and lisinopril. IV hydralazine PRN.

## 2022-04-21 NOTE — Hospital Course (Signed)
HPI on admission, per Dr. Blaine Hamper: " Keith [name] is a 72 y.o. male with medical history significant of hypertension, peptic ulcer disease, tobacco abuse, atrial fibrillation, GERD, CKD-3, medication noncompliance, who presents with vision change.   Patient states that over the last 2 weeks he has had changes in his vision. Pt was seen by ophthalmologist.  He states that he had a procedure on his right eye where "blood was taken out" and gas was injected, and has been having some spotting in his left eye.  He went to his optometrist today who sent him to ED due to concern for hypertensive emergency. His blood pressure is 216/142 in ED. He has aching, throbbing headache as well.  No chest pain or shortness of breath, or swelling. No nausea, vomiting, diarrhea or abdominal pain.  No symptoms of UTI. Of note, patient states that he has been off of his blood pressure medications for a year because he had issues getting it with his PCP. "     ED Course: troponin level 68, WBC 11.3, renal function at baseline, temperature normal, blood pressure 216/142, 192/103, heart rate 58, RR 21, oxygen saturation 94% on room air.  Patient is placed on PCU for observation."   1/11 - patient's BP remains uncontrolled.  Amlodipine and lisinopril resumed.  Off Cardene drip but needing IV hydralazine pushes.

## 2022-04-21 NOTE — Assessment & Plan Note (Signed)
Nicotine patch ordered.

## 2022-04-21 NOTE — ED Notes (Signed)
Multiple pt family member calling phones. RN spoke to family member and advised she was not on patient contacts to receive updates. Pt brother also called. RN gave patient phone to call family and advised that it is a phone for all patients to use so if possible to have family update each other to reduce continuous calls.

## 2022-04-22 ENCOUNTER — Encounter: Payer: Self-pay | Admitting: Internal Medicine

## 2022-04-22 DIAGNOSIS — I161 Hypertensive emergency: Secondary | ICD-10-CM | POA: Diagnosis not present

## 2022-04-22 LAB — BASIC METABOLIC PANEL
Anion gap: 8 (ref 5–15)
BUN: 20 mg/dL (ref 8–23)
CO2: 20 mmol/L — ABNORMAL LOW (ref 22–32)
Calcium: 8.5 mg/dL — ABNORMAL LOW (ref 8.9–10.3)
Chloride: 109 mmol/L (ref 98–111)
Creatinine, Ser: 1.16 mg/dL (ref 0.61–1.24)
GFR, Estimated: >60 mL/min (ref >60)
Glucose, Bld: 85 mg/dL (ref 70–99)
Potassium: 3.7 mmol/L (ref 3.5–5.1)
Sodium: 137 mmol/L (ref 135–145)

## 2022-04-22 LAB — MAGNESIUM: Magnesium: 2.3 mg/dL (ref 1.7–2.4)

## 2022-04-22 MED ORDER — LISINOPRIL 20 MG PO TABS: 20.0000 mg | ORAL_TABLET | Freq: Every day | ORAL | 3 refills | Status: AC

## 2022-04-22 MED ORDER — HYDRALAZINE HCL 50 MG PO TABS
50.0000 mg | ORAL_TABLET | Freq: Two times a day (BID) | ORAL | Status: DC
  Administered 2022-04-22: 50 mg via ORAL
  Filled 2022-04-22: qty 1

## 2022-04-22 MED ORDER — HYDRALAZINE HCL 100 MG PO TABS: 100.0000 mg | ORAL_TABLET | Freq: Three times a day (TID) | ORAL | 3 refills | Status: AC

## 2022-04-22 MED ORDER — AMLODIPINE BESYLATE 10 MG PO TABS: 10.0000 mg | ORAL_TABLET | Freq: Every day | ORAL | 3 refills | Status: AC

## 2022-04-22 MED ORDER — HYDRALAZINE HCL 50 MG PO TABS
100.0000 mg | ORAL_TABLET | Freq: Three times a day (TID) | ORAL | Status: DC
  Administered 2022-04-22: 100 mg via ORAL
  Filled 2022-04-22: qty 2

## 2022-04-22 NOTE — Discharge Instructions (Addendum)
Some PCP options in [place] area- not a comprehensive list  [facility] [place]- [phone] [facility] [place] [place]- [phone]  or [facility] Physician Referral Line [phone]

## 2022-04-22 NOTE — Progress Notes (Signed)
  Transition of Care [facility]) Screening Note   Patient Details  Name: KENSHIN SPLAWN Date of Birth: 1951/02/10   [facility]) CM/SW Contact:    Quin Hoop, LCSW Phone Number: 04/22/2022, 9:46 AM    Transition of Care Department [facility]) has reviewed patient and no TOC needs have been identified at this time. We will continue to monitor patient advancement through interdisciplinary progression rounds. If new patient transition needs arise, please place a TOC consult.

## 2022-04-22 NOTE — Progress Notes (Signed)
Patient DC home, PIV DC catheter intact. Reviewed DC instructions with the patient. Patient verbalized understanding DC plan.

## 2022-04-22 NOTE — Discharge Summary (Signed)
Physician Discharge Summary   Patient: Keith [name] MRN: [id number] DOB: April 12, 1950  Admit date:     04/20/2022  Discharge date: 04/22/22  Discharge Physician: Ezekiel Slocumb   PCP: Pcp, No   Recommendations at discharge:   Follow up with Primary Care in 1-2 weeks Follow up on BP control.  Pt was admitted with hypertensive emergency, had not been taking his medications as prescribed.  Discharging on amlodipine, lisinopril and hydralazine.  Avoiding beta blockers due to cocaine abuse. Repeat BMP, CBC at follow up  Discharge Diagnoses: Active Problems:   Essential hypertension   Vision changes   PAF (paroxysmal atrial fibrillation) (HCC)   Peptic ulcer disease   Chronic kidney disease, stage 3a (HCC)   Myocardial injury   Cocaine use disorder ([place])   Tobacco use disorder  Principal Problem (Resolved):   Hypertensive emergency Resolved Problems:   Leukocytosis  Hospital Course: HPI on admission, per Dr. Blaine Hamper: " Keith [name] is a 72 y.o. male with medical history significant of hypertension, peptic ulcer disease, tobacco abuse, atrial fibrillation, GERD, CKD-3, medication noncompliance, who presents with vision change.   Patient states that over the last 2 weeks he has had changes in his vision. Pt was seen by ophthalmologist.  He states that he had a procedure on his right eye where "blood was taken out" and gas was injected, and has been having some spotting in his left eye.  He went to his optometrist today who sent him to ED due to concern for hypertensive emergency. His blood pressure is 216/142 in ED. He has aching, throbbing headache as well.  No chest pain or shortness of breath, or swelling. No nausea, vomiting, diarrhea or abdominal pain.  No symptoms of UTI. Of note, patient states that he has been off of his blood pressure medications for a year because he had issues getting it with his PCP. "     ED Course: troponin level 68, WBC 11.3, renal function at baseline,  temperature normal, blood pressure 216/142, 192/103, heart rate 58, RR 21, oxygen saturation 94% on room air.  Patient is placed on PCU for observation."   1/11 - patient's BP remains uncontrolled.  Amlodipine and lisinopril resumed.  Off Cardene drip but needing IV hydralazine pushes.  1/12 -- BP overall improved, although not optimal yet.  Pt requests discharge home today.  He was advised on importance of compliance with medications and to establish with Primary Care and he expressed agreement and understanding.    Stable for discharge on regimen of amlodipine 10 mg daily, lisinopril 20 mg daily, and hydralazine 100 mg TID.  TOC provided patient with list of PCP's so he can get established for ongoing follow up care.   Assessment and Plan: * Hypertensive emergency-resolved as of 04/22/2022 Initially on Cardene drip. Resumed amlodipine and lisinopril. IV hydralazine PRN.   Essential hypertension Presented in Hypertensive Emergency. Mgmt as outlined.   Vision changes Recent procedure with retinal hemorrhage. Sent to ED by ophthalmology. MRI brain negative for stroke, shows old infarcts and chronic hypertensive microangiopathy. --Continue eye drops. --Follow up as scheduled.  PAF (paroxysmal atrial fibrillation) (HCC) Rate controlled.  Telemetry monitoring. Appears not to be on anticoagulation or rate control agents.  Peptic ulcer disease Continue PPI  Chronic kidney disease, stage 3a ([place]) Patient had creatinine 2.50 on 11/28/2018.  No recent creatinine data available.  On admission, creatinine 1.41. Monitor BMP.  Myocardial injury Due to demand ischemia in setting of hypertensive emergency. No  chest pain or ischemic EKC changes  Leukocytosis-resolved as of 04/22/2022 WBC on admission 11.3k. Normalized to 8.8k next day. No fevers or s/sx's of infection. Monitor CBC  Cocaine use disorder ([place]) Counseled about importance of quitting substance use and smoking, especially  with severely elevated BP. --UDS positive for cocaine --Nicotine patch ordered  Tobacco use disorder Nicotine patch ordered         Consultants: None Procedures performed: None  Disposition: Home Diet recommendation:  Cardiac diet DISCHARGE MEDICATION: Allergies as of 04/22/2022   No Known Allergies      Medication List     TAKE these medications    amLODipine 10 MG tablet Commonly known as: NORVASC Take 1 tablet (10 mg total) by mouth daily. What changed:  how much to take how to take this when to take this   Fluticasone-Salmeterol 500-50 MCG/DOSE Aepb Commonly known as: ADVAIR Inhale into the lungs.   hydrALAZINE 100 MG tablet Commonly known as: APRESOLINE Take 1 tablet (100 mg total) by mouth every 8 (eight) hours.   lisinopril 20 MG tablet Commonly known as: ZESTRIL Take 1 tablet (20 mg total) by mouth daily. Start taking on: April 23, 2022 What changed:  medication strength how much to take when to take this   moxifloxacin 0.5 % ophthalmic solution Commonly known as: VIGAMOX Place 1 drop into both eyes 3 (three) times daily.   Omeprazole 20 MG Tbec Take 1 tablet (20 mg total) by mouth 2 (two) times daily for 10 days. What changed: Another medication with the same name was removed. Continue taking this medication, and follow the directions you see here.   prednisoLONE acetate 1 % ophthalmic suspension Commonly known as: PRED FORTE Place 2 drops into both eyes 4 (four) times daily.        Discharge Exam: Filed Weights   04/21/22 0841  Weight: 64 kg   General exam: awake, alert, no acute distress HEENT: atraumatic, clear conjunctiva, anicteric sclera, moist mucus membranes, hearing grossly normal  Respiratory system: CTAB, no wheezes, rales or rhonchi, normal respiratory effort. Cardiovascular system: normal S1/S2, RRR, no JVD, murmurs, rubs, gallops,  no pedal edema.   Gastrointestinal system: soft, NT, ND, no HSM felt, +bowel  sounds. Central nervous system: A&O x4. no gross focal neurologic deficits, normal speech Extremities: moves all , no edema, normal tone Skin: dry flaky skin on all extremities Psychiatry: normal mood, congruent affect   Condition at discharge: stable  The results of significant diagnostics from this hospitalization (including imaging, microbiology, ancillary and laboratory) are listed below for reference.   Imaging Studies: MR BRAIN WO CONTRAST  Result Date: 04/20/2022 CLINICAL DATA:  Provided history: Neuro deficit, acute, stroke suspected. Additional history provided: Headache, hypertension. EXAM: MRI HEAD WITHOUT CONTRAST TECHNIQUE: Multiplanar, multiecho pulse sequences of the brain and surrounding structures were obtained without intravenous contrast. COMPARISON:  Head CT 04/20/2022 FINDINGS: Brain: No age advanced or lobar predominant parenchymal atrophy. Advanced multifocal T2 FLAIR hyperintense signal abnormality within the cerebral white matter and pons, nonspecific but compatible with chronic small vessel ischemic disease. This includes chronic lacunar infarcts within the bilateral cerebral hemispheric white matter. Chronic small-vessel ischemic changes also present within the bilateral deep gray nuclei, including chronic lacunar infarcts. Additional chronic small vessel ischemic changes within the bilateral cerebellar hemispheres, including a small chronic left cerebellar infarct. Numerous chronic parenchymal microhemorrhages with a deep gray nuclei and posterior fossa predominance. There is no acute infarct. No evidence of an intracranial mass. No extra-axial fluid collection.  No midline shift. Vascular: Maintained flow voids within the proximal large arterial vessels. Skull and upper cervical spine: No focal suspicious marrow lesion. Sinuses/Orbits: Gas within the anterior aspect of the right globe, unchanged from the head CT performed earlier today. Minimal mucosal thickening scattered  within bilateral ethmoid air cells. IMPRESSION: 1. No evidence of acute intracranial abnormality. 2. Advanced chronic small vessel ischemic changes within the supratentorial and infratentorial brain, with multiple chronic infarcts. 3. Numerous chronic parenchymal microhemorrhages with a deep gray nuclei and posterior fossa predominance. The distribution suggests sequelae of chronic hypertensive microangiopathy 4. Redemonstrated gas within the anterior aspect of the right globe, possibly related to a recent procedure or recent trauma. Clinical correlation is recommended. Electronically Signed   By: Kellie Simmering D.O.   On: 04/20/2022 14:41   CT Head Wo Contrast  Result Date: 04/20/2022 CLINICAL DATA:  Headache, posttraumatic.  Malignant hypertension. EXAM: CT HEAD WITHOUT CONTRAST TECHNIQUE: Contiguous axial images were obtained from the base of the skull through the vertex without intravenous contrast. RADIATION DOSE REDUCTION: This exam was performed according to the departmental dose-optimization program which includes automated exposure control, adjustment of the mA and/or kV according to patient size and/or use of iterative reconstruction technique. COMPARISON:  None Available. FINDINGS: Brain: Scattered areas of subcortical white matter hypoattenuation are somewhat advanced for age. No acute infarct, hemorrhage, or mass lesion is present. Remote ischemic changes are present in the lateral thalami bilaterally. Remote lacunar infarcts are present in the lentiform nuclei bilaterally. No focal encephalomalacia or posttraumatic changes are present. The ventricles are of normal size. The brainstem and cerebellum are within normal limits. Vascular: Atherosclerotic calcifications are present within the cavernous internal carotid arteries bilaterally and at the dural margin of the left vertebral artery. No significant asymmetric density is present. Skull: Calvarium is intact. No focal lytic or blastic lesions are  present. No significant extracranial soft tissue lesion is present. Sinuses/Orbits: The paranasal sinuses and mastoid air cells are clear. Gas is present within the right globe superior to the lens. This appears to be contained within the anterior chamber. Question recent procedure. No other lesions are present in either orbit. Other: IMPRESSION: 1. Gas within the right globe superior to the lens. This appears to be contained within the anterior chamber. Question recent procedure. I discussed this with Letitia Neri and she was unaware of any recent procedure. Question trauma. 2. No acute intracranial abnormality. 3. Scattered areas of subcortical white matter hypoattenuation are somewhat advanced for age. This likely reflects the sequela of chronic microvascular ischemia. 4. Remote ischemic changes in the lateral thalami bilaterally. 5. Remote lacunar infarcts in the lentiform nuclei bilaterally. Critical Value/emergent results were called by telephone at the time of interpretation on 04/20/2022 at 10:46 am to provider [facility] , who verbally acknowledged these results. Electronically Signed   By: San Morelle M.D.   On: 04/20/2022 10:47    Microbiology: Results for orders placed or performed during the hospital encounter of 03/09/19  SARS CORONAVIRUS 2 (TAT 6-24 HRS) Nasopharyngeal Nasopharyngeal Swab     Status: None   Collection Time: 03/09/19  7:13 PM   Specimen: Nasopharyngeal Swab  Result Value Ref Range Status   SARS Coronavirus 2 NEGATIVE NEGATIVE Final    Comment: (NOTE) SARS-CoV-2 target nucleic acids are NOT DETECTED. The SARS-CoV-2 RNA is generally detectable in upper and lower respiratory specimens during the acute phase of infection. Negative results do not preclude SARS-CoV-2 infection, do not rule out co-infections with other  pathogens, and should not be used as the sole basis for treatment or other patient management decisions. Negative results must be combined  with clinical observations, patient history, and epidemiological information. The expected result is Negative. Fact Sheet for Patients: SugarRoll.be Fact Sheet for Healthcare Providers: https://www.woods-mathews.com/ This test is not yet approved or cleared by the [place] FDA and  has been authorized for detection and/or diagnosis of SARS-CoV-2 by FDA under an Emergency Use Authorization (EUA). This EUA will remain  in effect (meaning this test can be used) for the duration of the COVID-19 declaration under Section 56 4(b)(1) of the Act, 21 U.S.C. section 360bbb-3(b)(1), unless the authorization is terminated or revoked sooner. Performed at [facility], [place] [address], [place], [place] [address]     Labs: CBC: Recent Labs  Lab 04/20/22 1014 04/21/22 0530  WBC 11.3* 8.8  NEUTROABS 6.8  --   HGB 14.8 14.1  HCT 43.9 40.8  MCV 90.7 89.7  PLT 262 511   Basic Metabolic Panel: Recent Labs  Lab 04/20/22 1014 04/21/22 0438 04/22/22 0626  NA 138 136 137  K 3.9 3.6 3.7  CL 108 108 109  CO2 21* 20* 20*  GLUCOSE 93 97 85  BUN 21 18 20   CREATININE 1.41* 1.23 1.16  CALCIUM 8.9 8.1* 8.5*  MG  --   --  2.3   Liver Function Tests: Recent Labs  Lab 04/20/22 1014  AST 27  ALT 14  ALKPHOS 98  BILITOT 0.6  PROT 8.4*  ALBUMIN 4.5   CBG: No results for input(s): "GLUCAP" in the last 168 hours.  Discharge time spent: greater than 30 minutes.  Signed: Ezekiel Slocumb, DO [facility] 04/22/2022

## 2022-06-27 ENCOUNTER — Encounter: Payer: Self-pay | Admitting: Internal Medicine

## 2023-02-07 DIAGNOSIS — R7303 Prediabetes: Secondary | ICD-10-CM | POA: Insufficient documentation

## 2023-02-07 NOTE — Progress Notes (Deleted)
There were no vitals taken for this visit.   Subjective:    Patient ID: Keith [name], male    DOB: 11-06-1950, 72 y.o.   MRN: [id number]  HPI: Keith [name] is a 72 y.o. male  No chief complaint on file.  Patient presents to clinic to establish care with new PCP.  Introduced to Publishing rights manager role and practice setting.  All questions answered.  Discussed provider/patient relationship and expectations.  Patient reports a history of ***. Patient denies a history of: Hypertension, Elevated Cholesterol, Diabetes, Thyroid problems, Depression, Anxiety, Neurological problems, and Abdominal problems.   Active Ambulatory Problems    Diagnosis Date Noted   Atrial fibrillation (HCC) 08/14/2018   Abdominal pain 02/23/2019   Left-sided chest pain 02/23/2019   Essential hypertension 07/25/2017   Tobacco use disorder 05/31/2018   Moderate protein-energy malnutrition (HCC) 02/23/2019   Acute renal failure (HCC) 02/23/2019   Hypokalemia 02/23/2019   Troponin level elevated 02/23/2019   Cocaine use disorder (HCC) 08/15/2018   Duodenal stricture 07/25/2017   Peptic ulcer disease 08/16/2018   Teeth missing, unspecified edentulism 08/15/2018   Unavailability of medical care 03/12/2016   Acute epigastric pain 02/24/2019   Dehydration    Acute gastritis without hemorrhage    Vitamin B12 deficiency 02/25/2019   Intractable vomiting    Postpyloric ulcer    Ileus following gastrointestinal surgery (HCC) 03/09/2019   Nausea and vomiting    CKD (chronic kidney disease), stage III (HCC) 01/27/2022   PAF (paroxysmal atrial fibrillation) (HCC) 04/20/2022   Chronic kidney disease, stage 3a (HCC) 04/20/2022   Myocardial injury 04/20/2022   Vision changes 04/20/2022   Resolved Ambulatory Problems    Diagnosis Date Noted   Chest pain 02/23/2019   Hypertensive emergency 04/20/2022   Leukocytosis 04/20/2022   Past Medical History:  Diagnosis Date   CKD (chronic kidney disease), stage II     Cocaine abuse (HCC)    Duodenal ulcer    Gastric ulcer    Hypertension    Noncompliance    Paroxysmal atrial fibrillation (HCC)    Tobacco abuse    Past Surgical History:  Procedure Laterality Date   ESOPHAGOGASTRODUODENOSCOPY (EGD) WITH PROPOFOL N/A 02/26/2019   Procedure: ESOPHAGOGASTRODUODENOSCOPY (EGD) WITH PROPOFOL;  Surgeon: Midge Minium, MD;  Location: [facility];  Service: Endoscopy;  Laterality: N/A;   GASTROJEJUNOSTOMY N/A 02/27/2019   Procedure: GASTROJEJUNOSTOMY;  Surgeon: Leafy Ro, MD;  Location: [facility];  Service: General;  Laterality: N/A;   Family History  Problem Relation Age of Onset   Diabetes Mother    Hypertension Mother    CAD Father    Diabetes Sister    Hypertension Sister    Kidney failure Sister      Review of Systems  Per HPI unless specifically indicated above     Objective:    There were no vitals taken for this visit.  Wt Readings from Last 3 Encounters:  04/21/22 141 lb 1.5 oz (64 kg)  04/17/19 141 lb 3.2 oz (64 kg)  03/25/19 139 lb 12.8 oz (63.4 kg)    Physical Exam  Results for orders placed or performed during the hospital encounter of 04/20/22  CBC with Differential  Result Value Ref Range   WBC 11.3 (H) 4.0 - 10.5 K/uL   RBC 4.84 4.22 - 5.81 MIL/uL   Hemoglobin 14.8 13.0 - 17.0 g/dL   HCT 40.9 81.1 - 91.4 %   MCV 90.7 80.0 - 100.0 fL   MCH 30.6 26.0 -  34.0 pg   MCHC 33.7 30.0 - 36.0 g/dL   RDW 16.1 09.6 - 04.5 %   Platelets 262 150 - 400 K/uL   nRBC 0.0 0.0 - 0.2 %   Neutrophils Relative % 59 %   Neutro Abs 6.8 1.7 - 7.7 K/uL   Lymphocytes Relative 28 %   Lymphs Abs 3.2 0.7 - 4.0 K/uL   Monocytes Relative 9 %   Monocytes Absolute 1.1 (H) 0.1 - 1.0 K/uL   Eosinophils Relative 2 %   Eosinophils Absolute 0.2 0.0 - 0.5 K/uL   Basophils Relative 1 %   Basophils Absolute 0.1 0.0 - 0.1 K/uL   Immature Granulocytes 1 %   Abs Immature Granulocytes 0.08 (H) 0.00 - 0.07 K/uL  Comprehensive metabolic panel   Result Value Ref Range   Sodium 138 135 - 145 mmol/L   Potassium 3.9 3.5 - 5.1 mmol/L   Chloride 108 98 - 111 mmol/L   CO2 21 (L) 22 - 32 mmol/L   Glucose, Bld 93 70 - 99 mg/dL   BUN 21 8 - 23 mg/dL   Creatinine, Ser 4.09 (H) 0.61 - 1.24 mg/dL   Calcium 8.9 8.9 - 81.1 mg/dL   Total Protein 8.4 (H) 6.5 - 8.1 g/dL   Albumin 4.5 3.5 - 5.0 g/dL   AST 27 15 - 41 U/L   ALT 14 0 - 44 U/L   Alkaline Phosphatase 98 38 - 126 U/L   Total Bilirubin 0.6 0.3 - 1.2 mg/dL   GFR, Estimated 53 (L) >60 mL/min   Anion gap 9 5 - 15  Brain natriuretic peptide  Result Value Ref Range   B Natriuretic Peptide 131.5 (H) 0.0 - 100.0 pg/mL  Urine Drug Screen, Qualitative ([facility] only)  Result Value Ref Range   Tricyclic, Ur Screen NONE DETECTED NONE DETECTED   Amphetamines, Ur Screen NONE DETECTED NONE DETECTED   MDMA (Ecstasy)Ur Screen NONE DETECTED NONE DETECTED   Cocaine Metabolite,Ur [place] POSITIVE (A) NONE DETECTED   Opiate, Ur Screen NONE DETECTED NONE DETECTED   Phencyclidine (PCP) Ur S NONE DETECTED NONE DETECTED   Cannabinoid 50 Ng, Ur  NONE DETECTED NONE DETECTED   Barbiturates, Ur Screen NONE DETECTED NONE DETECTED   Benzodiazepine, Ur Scrn NONE DETECTED NONE DETECTED   Methadone Scn, Ur NONE DETECTED NONE DETECTED  Lipid panel  Result Value Ref Range   Cholesterol 152 0 - 200 mg/dL   Triglycerides 94 <914 mg/dL   HDL 47 >78 mg/dL   Total CHOL/HDL Ratio 3.2 RATIO   VLDL 19 0 - 40 mg/dL   LDL Cholesterol 86 0 - 99 mg/dL  Basic metabolic panel  Result Value Ref Range   Sodium 136 135 - 145 mmol/L   Potassium 3.6 3.5 - 5.1 mmol/L   Chloride 108 98 - 111 mmol/L   CO2 20 (L) 22 - 32 mmol/L   Glucose, Bld 97 70 - 99 mg/dL   BUN 18 8 - 23 mg/dL   Creatinine, Ser 2.95 0.61 - 1.24 mg/dL   Calcium 8.1 (L) 8.9 - 10.3 mg/dL   GFR, Estimated >62 >13 mL/min   Anion gap 8 5 - 15  CBC  Result Value Ref Range   WBC 8.8 4.0 - 10.5 K/uL   RBC 4.55 4.22 - 5.81 MIL/uL   Hemoglobin 14.1 13.0 - 17.0  g/dL   HCT 08.6 57.8 - 46.9 %   MCV 89.7 80.0 - 100.0 fL   MCH 31.0 26.0 - 34.0 pg  MCHC 34.6 30.0 - 36.0 g/dL   RDW 16.1 09.6 - 04.5 %   Platelets 250 150 - 400 K/uL   nRBC 0.0 0.0 - 0.2 %  Hemoglobin A1c  Result Value Ref Range   Hgb A1c MFr Bld 5.6 4.8 - 5.6 %   Mean Plasma Glucose 114.02 mg/dL  Basic metabolic panel  Result Value Ref Range   Sodium 137 135 - 145 mmol/L   Potassium 3.7 3.5 - 5.1 mmol/L   Chloride 109 98 - 111 mmol/L   CO2 20 (L) 22 - 32 mmol/L   Glucose, Bld 85 70 - 99 mg/dL   BUN 20 8 - 23 mg/dL   Creatinine, Ser 4.09 0.61 - 1.24 mg/dL   Calcium 8.5 (L) 8.9 - 10.3 mg/dL   GFR, Estimated >81 >19 mL/min   Anion gap 8 5 - 15  Magnesium  Result Value Ref Range   Magnesium 2.3 1.7 - 2.4 mg/dL  Troponin I (High Sensitivity)  Result Value Ref Range   Troponin I (High Sensitivity) 68 (H) <18 ng/L  Troponin I (High Sensitivity)  Result Value Ref Range   Troponin I (High Sensitivity) 75 (H) <18 ng/L      Assessment & Plan:   Problem List Items Addressed This Visit   None    Follow up plan: No follow-ups on file.

## 2023-02-08 ENCOUNTER — Ambulatory Visit: Payer: 59 | Admitting: Nurse Practitioner

## 2023-09-14 ENCOUNTER — Encounter: Payer: Self-pay | Admitting: Internal Medicine

## 2023-09-15 ENCOUNTER — Other Ambulatory Visit: Payer: Self-pay | Admitting: Internal Medicine

## 2023-09-15 DIAGNOSIS — Z136 Encounter for screening for cardiovascular disorders: Secondary | ICD-10-CM

## 2023-09-22 ENCOUNTER — Encounter: Payer: Self-pay | Admitting: Emergency Medicine

## 2023-09-25 ENCOUNTER — Ambulatory Visit: Admission: RE | Admit: 2023-09-25 | Source: Ambulatory Visit
